# Patient Record
Sex: Male | Born: 1935
Health system: Southern US, Community
[De-identification: ages and names within clinical notes are randomized; demographics above are authoritative.]

## PROBLEM LIST (undated history)

## (undated) DIAGNOSIS — Z7901 Long term (current) use of anticoagulants: Secondary | ICD-10-CM

## (undated) DIAGNOSIS — I10 Essential (primary) hypertension: Secondary | ICD-10-CM

## (undated) DIAGNOSIS — R351 Nocturia: Secondary | ICD-10-CM

## (undated) DIAGNOSIS — Z9229 Personal history of other drug therapy: Secondary | ICD-10-CM

## (undated) DIAGNOSIS — N400 Enlarged prostate without lower urinary tract symptoms: Secondary | ICD-10-CM

## (undated) DIAGNOSIS — I4892 Unspecified atrial flutter: Secondary | ICD-10-CM

## (undated) DIAGNOSIS — R0602 Shortness of breath: Secondary | ICD-10-CM

## (undated) DIAGNOSIS — I429 Cardiomyopathy, unspecified: Secondary | ICD-10-CM

## (undated) HISTORY — DX: Cardiomyopathy, unspecified: I42.9

## (undated) HISTORY — PX: FEMORAL HERNIA REPAIR: SHX632

## (undated) HISTORY — DX: Personal history of other drug therapy: Z92.29

## (undated) HISTORY — DX: Essential (primary) hypertension: I10

## (undated) HISTORY — DX: Unspecified atrial flutter: I48.92

## (undated) HISTORY — PX: TURP VAPORIZATION: SUR1397

## (undated) HISTORY — PX: UMBILICAL HERNIA REPAIR: SHX196

## (undated) HISTORY — DX: Long term (current) use of anticoagulants: Z79.01

---

## 2002-03-23 ENCOUNTER — Ambulatory Visit (HOSPITAL_BASED_OUTPATIENT_CLINIC_OR_DEPARTMENT_OTHER): Admission: RE | Admit: 2002-03-23 | Discharge: 2002-03-23 | Payer: Self-pay | Admitting: Surgery

## 2002-06-25 HISTORY — PX: TOTAL KNEE ARTHROPLASTY: SHX125

## 2002-07-29 ENCOUNTER — Encounter: Payer: Self-pay | Admitting: Orthopedic Surgery

## 2002-08-03 ENCOUNTER — Inpatient Hospital Stay (HOSPITAL_COMMUNITY): Admission: RE | Admit: 2002-08-03 | Discharge: 2002-08-08 | Payer: Self-pay | Admitting: Orthopedic Surgery

## 2002-08-03 ENCOUNTER — Encounter: Payer: Self-pay | Admitting: Orthopedic Surgery

## 2004-02-14 ENCOUNTER — Encounter (INDEPENDENT_AMBULATORY_CARE_PROVIDER_SITE_OTHER): Payer: Self-pay | Admitting: *Deleted

## 2004-02-14 ENCOUNTER — Ambulatory Visit (HOSPITAL_BASED_OUTPATIENT_CLINIC_OR_DEPARTMENT_OTHER): Admission: RE | Admit: 2004-02-14 | Discharge: 2004-02-14 | Payer: Self-pay | Admitting: Urology

## 2004-02-14 ENCOUNTER — Ambulatory Visit (HOSPITAL_COMMUNITY): Admission: RE | Admit: 2004-02-14 | Discharge: 2004-02-14 | Payer: Self-pay | Admitting: Urology

## 2008-08-20 ENCOUNTER — Ambulatory Visit: Payer: Self-pay | Admitting: Cardiology

## 2008-08-25 ENCOUNTER — Ambulatory Visit: Payer: Self-pay | Admitting: Cardiology

## 2008-08-27 ENCOUNTER — Ambulatory Visit: Payer: Self-pay | Admitting: Cardiology

## 2008-08-31 ENCOUNTER — Ambulatory Visit: Payer: Self-pay | Admitting: Cardiology

## 2008-09-01 ENCOUNTER — Ambulatory Visit: Payer: Self-pay | Admitting: Internal Medicine

## 2008-09-02 ENCOUNTER — Encounter: Payer: Self-pay | Admitting: Internal Medicine

## 2008-09-07 ENCOUNTER — Ambulatory Visit: Payer: Self-pay | Admitting: Cardiology

## 2008-09-09 ENCOUNTER — Inpatient Hospital Stay (HOSPITAL_COMMUNITY): Admission: RE | Admit: 2008-09-09 | Discharge: 2008-09-10 | Payer: Self-pay | Admitting: Internal Medicine

## 2008-09-09 ENCOUNTER — Ambulatory Visit: Payer: Self-pay | Admitting: Internal Medicine

## 2008-09-09 ENCOUNTER — Encounter: Payer: Self-pay | Admitting: Cardiology

## 2008-09-17 ENCOUNTER — Ambulatory Visit: Payer: Self-pay | Admitting: Cardiology

## 2008-10-01 ENCOUNTER — Ambulatory Visit: Payer: Self-pay | Admitting: Cardiology

## 2008-10-12 ENCOUNTER — Encounter (INDEPENDENT_AMBULATORY_CARE_PROVIDER_SITE_OTHER): Payer: Self-pay | Admitting: *Deleted

## 2008-10-15 ENCOUNTER — Ambulatory Visit: Payer: Self-pay | Admitting: Internal Medicine

## 2008-10-15 ENCOUNTER — Ambulatory Visit: Payer: Self-pay | Admitting: Cardiology

## 2008-11-03 ENCOUNTER — Ambulatory Visit: Payer: Self-pay | Admitting: Cardiology

## 2008-11-04 ENCOUNTER — Ambulatory Visit: Payer: Self-pay | Admitting: Cardiology

## 2008-11-04 ENCOUNTER — Telehealth: Payer: Self-pay | Admitting: Internal Medicine

## 2008-11-05 ENCOUNTER — Ambulatory Visit: Payer: Self-pay | Admitting: Cardiology

## 2008-11-06 ENCOUNTER — Telehealth (INDEPENDENT_AMBULATORY_CARE_PROVIDER_SITE_OTHER): Payer: Self-pay | Admitting: Nurse Practitioner

## 2008-11-09 ENCOUNTER — Ambulatory Visit: Payer: Self-pay | Admitting: Cardiology

## 2008-11-16 ENCOUNTER — Encounter: Payer: Self-pay | Admitting: Cardiology

## 2008-11-18 ENCOUNTER — Ambulatory Visit: Payer: Self-pay | Admitting: Cardiology

## 2008-11-23 ENCOUNTER — Ambulatory Visit: Payer: Self-pay | Admitting: Cardiology

## 2008-11-29 ENCOUNTER — Encounter: Payer: Self-pay | Admitting: Cardiology

## 2008-12-01 ENCOUNTER — Telehealth: Payer: Self-pay | Admitting: Cardiology

## 2008-12-03 ENCOUNTER — Ambulatory Visit: Payer: Self-pay | Admitting: Cardiology

## 2008-12-10 ENCOUNTER — Ambulatory Visit: Payer: Self-pay

## 2008-12-17 ENCOUNTER — Ambulatory Visit: Payer: Self-pay

## 2008-12-20 ENCOUNTER — Ambulatory Visit: Payer: Self-pay | Admitting: Cardiology

## 2008-12-24 ENCOUNTER — Ambulatory Visit: Payer: Self-pay | Admitting: Cardiology

## 2008-12-28 ENCOUNTER — Ambulatory Visit: Payer: Self-pay | Admitting: Cardiology

## 2009-01-14 ENCOUNTER — Ambulatory Visit: Payer: Self-pay

## 2009-01-19 ENCOUNTER — Ambulatory Visit: Payer: Self-pay | Admitting: Cardiology

## 2009-01-28 ENCOUNTER — Ambulatory Visit: Payer: Self-pay | Admitting: Cardiology

## 2009-02-07 ENCOUNTER — Encounter: Payer: Self-pay | Admitting: *Deleted

## 2009-02-15 ENCOUNTER — Ambulatory Visit: Payer: Self-pay | Admitting: Cardiology

## 2009-02-15 LAB — CONVERTED CEMR LAB
POC INR: 2.1
Prothrombin Time: 17.9 s

## 2009-03-15 ENCOUNTER — Ambulatory Visit: Payer: Self-pay | Admitting: Cardiology

## 2009-03-15 LAB — CONVERTED CEMR LAB: POC INR: 2.4

## 2009-03-24 DIAGNOSIS — I2510 Atherosclerotic heart disease of native coronary artery without angina pectoris: Secondary | ICD-10-CM | POA: Insufficient documentation

## 2009-04-12 ENCOUNTER — Ambulatory Visit: Payer: Self-pay | Admitting: Cardiology

## 2009-04-12 LAB — CONVERTED CEMR LAB: POC INR: 2.6

## 2009-04-26 ENCOUNTER — Encounter: Payer: Self-pay | Admitting: Cardiology

## 2009-05-10 ENCOUNTER — Ambulatory Visit: Payer: Self-pay | Admitting: Cardiology

## 2009-05-10 LAB — CONVERTED CEMR LAB: POC INR: 1.9

## 2009-05-11 ENCOUNTER — Encounter: Payer: Self-pay | Admitting: Cardiology

## 2009-05-31 ENCOUNTER — Encounter: Payer: Self-pay | Admitting: Cardiology

## 2009-06-07 ENCOUNTER — Ambulatory Visit: Payer: Self-pay | Admitting: Cardiology

## 2009-06-07 LAB — CONVERTED CEMR LAB: POC INR: 2.5

## 2009-06-21 ENCOUNTER — Encounter: Payer: Self-pay | Admitting: Cardiology

## 2009-06-22 ENCOUNTER — Ambulatory Visit: Payer: Self-pay | Admitting: Cardiology

## 2009-06-22 DIAGNOSIS — R142 Eructation: Secondary | ICD-10-CM

## 2009-06-22 DIAGNOSIS — R141 Gas pain: Secondary | ICD-10-CM

## 2009-06-22 DIAGNOSIS — R143 Flatulence: Secondary | ICD-10-CM

## 2009-06-22 DIAGNOSIS — I4892 Unspecified atrial flutter: Secondary | ICD-10-CM | POA: Insufficient documentation

## 2009-06-23 ENCOUNTER — Encounter: Payer: Self-pay | Admitting: Cardiology

## 2009-07-12 ENCOUNTER — Ambulatory Visit: Payer: Self-pay | Admitting: Cardiology

## 2009-07-12 LAB — CONVERTED CEMR LAB: POC INR: 2.2

## 2009-07-19 ENCOUNTER — Encounter: Payer: Self-pay | Admitting: Cardiology

## 2009-07-26 ENCOUNTER — Telehealth (INDEPENDENT_AMBULATORY_CARE_PROVIDER_SITE_OTHER): Payer: Self-pay | Admitting: *Deleted

## 2009-08-09 ENCOUNTER — Ambulatory Visit: Payer: Self-pay | Admitting: Cardiology

## 2009-08-09 LAB — CONVERTED CEMR LAB: POC INR: 2.8

## 2009-08-10 ENCOUNTER — Encounter: Payer: Self-pay | Admitting: Cardiology

## 2009-09-06 ENCOUNTER — Ambulatory Visit: Payer: Self-pay | Admitting: Cardiology

## 2009-09-28 ENCOUNTER — Telehealth (INDEPENDENT_AMBULATORY_CARE_PROVIDER_SITE_OTHER): Payer: Self-pay | Admitting: *Deleted

## 2009-09-30 ENCOUNTER — Encounter: Payer: Self-pay | Admitting: Cardiology

## 2009-10-04 ENCOUNTER — Ambulatory Visit: Payer: Self-pay | Admitting: Cardiology

## 2009-10-04 LAB — CONVERTED CEMR LAB: POC INR: 2.7

## 2009-10-10 ENCOUNTER — Telehealth (INDEPENDENT_AMBULATORY_CARE_PROVIDER_SITE_OTHER): Payer: Self-pay | Admitting: *Deleted

## 2009-10-25 ENCOUNTER — Encounter: Payer: Self-pay | Admitting: Cardiology

## 2009-10-31 ENCOUNTER — Encounter: Payer: Self-pay | Admitting: Cardiology

## 2009-11-18 ENCOUNTER — Ambulatory Visit: Payer: Self-pay | Admitting: Cardiology

## 2009-11-18 LAB — CONVERTED CEMR LAB: POC INR: 2.6

## 2009-11-30 ENCOUNTER — Ambulatory Visit: Payer: Self-pay | Admitting: Cardiology

## 2009-11-30 DIAGNOSIS — R0602 Shortness of breath: Secondary | ICD-10-CM

## 2009-12-20 ENCOUNTER — Ambulatory Visit: Payer: Self-pay | Admitting: Cardiology

## 2009-12-20 LAB — CONVERTED CEMR LAB: POC INR: 2.6

## 2010-01-20 ENCOUNTER — Ambulatory Visit: Payer: Self-pay | Admitting: Cardiology

## 2010-02-02 ENCOUNTER — Encounter: Payer: Self-pay | Admitting: Cardiology

## 2010-02-06 ENCOUNTER — Encounter: Payer: Self-pay | Admitting: Cardiology

## 2010-02-15 ENCOUNTER — Encounter (INDEPENDENT_AMBULATORY_CARE_PROVIDER_SITE_OTHER): Payer: Self-pay | Admitting: *Deleted

## 2010-02-17 ENCOUNTER — Ambulatory Visit: Payer: Self-pay | Admitting: Cardiology

## 2010-02-17 LAB — CONVERTED CEMR LAB: POC INR: 2.4

## 2010-03-17 ENCOUNTER — Ambulatory Visit: Payer: Self-pay | Admitting: Cardiology

## 2010-04-14 ENCOUNTER — Ambulatory Visit: Payer: Self-pay | Admitting: Cardiology

## 2010-05-12 ENCOUNTER — Ambulatory Visit: Payer: Self-pay | Admitting: Cardiology

## 2010-06-16 ENCOUNTER — Encounter: Payer: Self-pay | Admitting: Physician Assistant

## 2010-06-16 ENCOUNTER — Ambulatory Visit: Payer: Self-pay | Admitting: Cardiology

## 2010-06-16 ENCOUNTER — Encounter: Payer: Self-pay | Admitting: Cardiology

## 2010-06-20 ENCOUNTER — Encounter: Payer: Self-pay | Admitting: Cardiology

## 2010-07-14 ENCOUNTER — Ambulatory Visit
Admission: RE | Admit: 2010-07-14 | Discharge: 2010-07-14 | Payer: Self-pay | Source: Home / Self Care | Attending: Cardiology | Admitting: Cardiology

## 2010-07-25 ENCOUNTER — Telehealth (INDEPENDENT_AMBULATORY_CARE_PROVIDER_SITE_OTHER): Payer: Self-pay | Admitting: *Deleted

## 2010-07-25 NOTE — Progress Notes (Signed)
Summary: Question concering CCR  Phone Note Call from Patient Call back at Home Phone [phone]   Caller: patient walked Reason for Call: Talk to Nurse Summary of Call: Patient would like to know if he can come off coumadin for 5 days for colonscopy schd for May 9th.  Did not know if doctor had contacted [place] in reference to this Initial call taken by: Claudette Laws,  October 10, 2009 11:03 AM  Follow-up for Phone Call        Patient notified.    Follow-up by: Hoover Brunette, LPN,  October 11, 2009 8:53 AM

## 2010-07-25 NOTE — Medication Information (Signed)
Summary: ccr-lr  Anticoagulant Therapy  Managed by: Vashti Hey, RN PCP: Lucianne Muss MD: Andee Lineman MD, Michelle Piper Indication 1: Atrial Flutter (ICD-427.32) [facility] [place] Site: [place] INR POC 2.5  Dietary changes: no    Health status changes: no    Bleeding/hemorrhagic complications: no    Recent/future hospitalizations: no    Any changes in medication regimen? no    Recent/future dental: no  Any missed doses?: yes     Details: Coumadin was on hold 3 days to have toenail removed 2 weeks ago  Is patient compliant with meds? yes       Allergies: 1)  ! Pcn 2)  ! Sulfa  Anticoagulation Management History:      The patient is taking warfarin and comes in today for a routine follow up visit.  Positive risk factors for bleeding include an age of 43 years or older.  The bleeding index is 'intermediate risk'.  Negative CHADS2 values include Age > 23 years old.  The start date was 08/20/2008.  Anticoagulation responsible provider: Andee Lineman MD, Michelle Piper.  INR POC: 2.5.  Cuvette Lot#: [id number].  Exp: 10/11.    Anticoagulation Management Assessment/Plan:      The patient's current anticoagulation dose is Warfarin sodium 5 mg tabs: Use as directed by Anticoagulation Clinic.  The target INR is 2 - 3.  The next INR is due 04/14/2010.  Anticoagulation instructions were given to patient.  Results were reviewed/authorized by Vashti Hey, RN.  He was notified by Vashti Hey RN.         Prior Anticoagulation Instructions: INR 2.4 Continue coumadin 5mg  once daily except 2.5mg  on Fridays  Current Anticoagulation Instructions: INR 2.5 Continue coumadin 5mg  once daily except 2.5mg  on Fridays

## 2010-07-25 NOTE — Medication Information (Signed)
Summary: ccr-lr  Anticoagulant Therapy  Managed by: Vashti Hey, RN PCP: Lucianne Muss MD: Andee Lineman MD, Michelle Piper Indication 1: Atrial Flutter (ICD-427.32) Lab [facility] [place] Site: [place] INR POC 2.2  Dietary changes: no    Health status changes: no    Bleeding/hemorrhagic complications: no    Recent/future hospitalizations: yes       Details: has thyroid Bx  Any changes in medication regimen? no    Recent/future dental: no  Any missed doses?: yes     Details: Was off coumadin 4 days for Bx 3 weeks ago  Is patient compliant with meds? yes       Allergies: 1)  ! Pcn 2)  ! Sulfa  Anticoagulation Management History:      The patient is taking warfarin and comes in today for a routine follow up visit.  Positive risk factors for bleeding include an age of 75 years or older.  The bleeding index is 'intermediate risk'.  Negative CHADS2 values include Age > 75 years old.  The start date was 08/20/2008.  Anticoagulation responsible provider: Andee Lineman MD, Michelle Piper.  INR POC: 2.2.  Cuvette Lot#: [id number].  Exp: 10/11.    Anticoagulation Management Assessment/Plan:      The patient's current anticoagulation dose is Warfarin sodium 5 mg tabs: Use as directed by Anticoagulation Clinic.  The target INR is 2 - 3.  The next INR is due 08/09/2009.  Anticoagulation instructions were given to patient.  Results were reviewed/authorized by Vashti Hey, RN.  He was notified by Vashti Hey RN.         Prior Anticoagulation Instructions: INR 2.5 Continue coumadin 5mg  once daily except 2.5mg  on Fridays  Current Anticoagulation Instructions: INR 2.2 Continue coumadin 5mg  once daily except 2.5mg  on Fridays

## 2010-07-25 NOTE — Letter (Signed)
Summary: Engineer, materials at [facility]  [address]   [place], [place] [address]   Phone: [phone]  Fax: [phone]        February 15, 2010 MRN: [id number]   [name] [address] [place], [place]  [address]   Dear [name],  Your test ordered by Selena Batten has been reviewed by your physician (or physician assistant) and was found to be normal or stable. Your physician (or physician assistant) felt no changes were needed at this time.  ____ Echocardiogram  ____ Cardiac Stress Test  __X__ Lab Work  ____ Peripheral vascular study of arms, legs or neck  ____ CT scan or X-ray  __X__ Lung or Breathing test  ____ Other:   Thank you.   Hoover Brunette, LPN    Duane Boston, M.D., F.A.C.C. Thressa Sheller, M.D., F.A.C.C. Oneal Grout, M.D., F.A.C.C. Cheree Ditto, M.D., F.A.C.C. Daiva Nakayama, M.D., F.A.C.C. Kenney Houseman, M.D., F.A.C.C. Jeanne Ivan, PA-C

## 2010-07-25 NOTE — Medication Information (Signed)
Summary: ccr-lr  Anticoagulant Therapy  Managed by: Vashti Hey, RN PCP: Lucianne Muss MD: Andee Lineman MD, Michelle Piper Indication 1: Atrial Flutter (ICD-427.32) Lab [facility] [place] Site: [place] INR POC 2.7  Dietary changes: no    Health status changes: no    Bleeding/hemorrhagic complications: no    Recent/future hospitalizations: no    Any changes in medication regimen? no    Recent/future dental: no  Any missed doses?: no       Is patient compliant with meds? yes       Allergies: 1)  ! Pcn 2)  ! Sulfa  Anticoagulation Management History:      The patient is taking warfarin and comes in today for a routine follow up visit.  Positive risk factors for bleeding include an age of 45 years or older.  The bleeding index is 'intermediate risk'.  Negative CHADS2 values include Age > 55 years old.  The start date was 08/20/2008.  Anticoagulation responsible provider: Andee Lineman MD, Michelle Piper.  INR POC: 2.7.  Cuvette Lot#: [id number].  Exp: 10/11.    Anticoagulation Management Assessment/Plan:      The patient's current anticoagulation dose is Warfarin sodium 5 mg tabs: Use as directed by Anticoagulation Clinic.  The target INR is 2 - 3.  The next INR is due 11/01/2009.  Anticoagulation instructions were given to patient.  Results were reviewed/authorized by Vashti Hey, RN.  He was notified by Vashti Hey RN.         Prior Anticoagulation Instructions: INR 2.9 Continue coumadin 5mg  once daily except 2.5mg  on Fridays  Current Anticoagulation Instructions: INR 2.7 Continue coumadin 5mg  once daily except 2.5mg  on Fridays

## 2010-07-25 NOTE — Medication Information (Signed)
Summary: ccr-lr  Anticoagulant Therapy  Managed by: Vashti Hey, RN PCP: Lucianne Muss MD: Andee Lineman MD, Michelle Piper Indication 1: Atrial Flutter (ICD-427.32) [facility]  Site: [place] INR POC 2.1  Dietary changes: no    Health status changes: no    Bleeding/hemorrhagic complications: no    Recent/future hospitalizations: no    Any changes in medication regimen? no    Recent/future dental: no  Any missed doses?: no       Is patient compliant with meds? yes       Allergies: 1)  ! Pcn 2)  ! Sulfa  Anticoagulation Management History:      The patient is taking warfarin and comes in today for a routine follow up visit.  Positive risk factors for bleeding include an age of 34 years or older.  The bleeding index is 'intermediate risk'.  Negative CHADS2 values include Age > 50 years old.  The start date was 08/20/2008.  Anticoagulation responsible Loree Shehata: Andee Lineman MD, Michelle Piper.  INR POC: 2.1.  Cuvette Lot#: [id number].  Exp: 10/11.    Anticoagulation Management Assessment/Plan:      The patient's current anticoagulation dose is Warfarin sodium 5 mg tabs: Use as directed by Anticoagulation Clinic.  The target INR is 2 - 3.  The next INR is due 06/16/2010.  Anticoagulation instructions were given to patient.  Results were reviewed/authorized by Vashti Hey, RN.  He was notified by Vashti Hey RN.         Prior Anticoagulation Instructions: INR 2.8 Continue coumadin 5mg  once daily except 2.5mg  on Fridays  Current Anticoagulation Instructions: INR 2.1 Continue coumadin 5mg  once daily except 2.5mg  on Fridays

## 2010-07-25 NOTE — Medication Information (Signed)
Summary: ccr-lr  Anticoagulant Therapy  Managed by: Vashti Hey, RN PCP: Lucianne Muss MD: Diona Browner MD, Remi Deter Indication 1: Atrial Flutter (ICD-427.32) Lab [facility] [place] Site: [place] INR POC 2.8  Dietary changes: no    Health status changes: no    Bleeding/hemorrhagic complications: no    Recent/future hospitalizations: no    Any changes in medication regimen? no    Recent/future dental: no  Any missed doses?: no       Is patient compliant with meds? yes       Allergies: 1)  ! Pcn 2)  ! Sulfa  Anticoagulation Management History:      The patient is taking warfarin and comes in today for a routine follow up visit.  Positive risk factors for bleeding include an age of 75 years or older.  The bleeding index is 'intermediate risk'.  Negative CHADS2 values include Age > 22 years old.  The start date was 08/20/2008.  Anticoagulation responsible provider: Diona Browner MD, Remi Deter.  INR POC: 2.8.  Cuvette Lot#: [id number].  Exp: 10/11.    Anticoagulation Management Assessment/Plan:      The patient's current anticoagulation dose is Warfarin sodium 5 mg tabs: Use as directed by Anticoagulation Clinic.  The target INR is 2 - 3.  The next INR is due 02/17/2010.  Anticoagulation instructions were given to patient.  Results were reviewed/authorized by Vashti Hey, RN.  He was notified by Vashti Hey RN.         Prior Anticoagulation Instructions: INR 2.6 Continue coumadin 5mg  once daily except 2.5mg  on Fridays  Current Anticoagulation Instructions: INR 2.8 Continue coumadin 5mg  once daily except 2.5mg  on Fridays

## 2010-07-25 NOTE — Medication Information (Signed)
Summary: ccr-lr  Anticoagulant Therapy  Managed by: Vashti Hey, RN PCP: Lucianne Muss MD: Andee Lineman MD, Michelle Piper Indication 1: Atrial Flutter (ICD-427.32) Lab [facility] [place] Site: [place] INR POC 2.6  Dietary changes: no    Health status changes: no    Bleeding/hemorrhagic complications: no    Recent/future hospitalizations: no    Any changes in medication regimen? no    Recent/future dental: no  Any missed doses?: no       Is patient compliant with meds? yes       Allergies: 1)  ! Pcn 2)  ! Sulfa  Anticoagulation Management History:      The patient is taking warfarin and comes in today for a routine follow up visit.  Positive risk factors for bleeding include an age of 75 years or older.  The bleeding index is 'intermediate risk'.  Negative CHADS2 values include Age > 32 years old.  The start date was 08/20/2008.  Anticoagulation responsible provider: Andee Lineman MD, Michelle Piper.  INR POC: 2.6.  Cuvette Lot#: [id number].  Exp: 10/11.    Anticoagulation Management Assessment/Plan:      The patient's current anticoagulation dose is Warfarin sodium 5 mg tabs: Use as directed by Anticoagulation Clinic.  The target INR is 2 - 3.  The next INR is due 12/20/2009.  Anticoagulation instructions were given to patient.  Results were reviewed/authorized by Vashti Hey, RN.  He was notified by Vashti Hey RN.         Prior Anticoagulation Instructions: INR 2.7 Continue coumadin 5mg  once daily except 2.5mg  on Fridays  Current Anticoagulation Instructions: INR 2.6 Continue coumadin 5mg  once daily except 2.5mg  on Fridays

## 2010-07-25 NOTE — Miscellaneous (Signed)
Summary: Orders Update - LFT,BNP,TSH  Clinical Lists Changes  Orders: Added new Test order of T-Hepatic Function [phone]) - Signed Added new Test order of T-BNP  (B Natriuretic Peptide) [phone]) - Signed Added new Test order of T-TSH ([id number]) - Signed

## 2010-07-25 NOTE — Letter (Signed)
Summary: External Correspondence/ PROGRESS NOTE [facility]   External Correspondence/ PROGRESS NOTE [facility]   Imported By: Dorise Hiss 07/21/2009 08:26:09  _____________________________________________________________________  External Attachment:    Type:   Image     Comment:   External Document

## 2010-07-25 NOTE — Medication Information (Signed)
Summary: ccr-lr  Anticoagulant Therapy  Managed by: Vashti Hey, RN PCP: Lucianne Muss MD: Antoine Poche MD, Fayrene Fearing Indication 1: Atrial Flutter (ICD-427.32) Lab [facility] [place] Site: [place] INR POC 2.8  Dietary changes: no    Health status changes: no    Bleeding/hemorrhagic complications: no    Recent/future hospitalizations: no    Any changes in medication regimen? yes       Details: started [employer] 08/04/09  Recent/future dental: no  Any missed doses?: no       Is patient compliant with meds? yes       Allergies: 1)  ! Pcn 2)  ! Sulfa  Anticoagulation Management History:      The patient is taking warfarin and comes in today for a routine follow up visit.  Positive risk factors for bleeding include an age of 38 years or older.  The bleeding index is 'intermediate risk'.  Negative CHADS2 values include Age > 40 years old.  The start date was 08/20/2008.  Anticoagulation responsible provider: Antoine Poche MD, Fayrene Fearing.  INR POC: 2.8.  Cuvette Lot#: [id number].  Exp: 10/11.    Anticoagulation Management Assessment/Plan:      The patient's current anticoagulation dose is Warfarin sodium 5 mg tabs: Use as directed by Anticoagulation Clinic.  The target INR is 2 - 3.  The next INR is due 09/06/2009.  Anticoagulation instructions were given to patient.  Results were reviewed/authorized by Vashti Hey, RN.  He was notified by Vashti Hey RN.         Prior Anticoagulation Instructions: INR 2.2 Continue coumadin 5mg  once daily except 2.5mg  on Fridays  Current Anticoagulation Instructions: INR 2.8 Continue coumadin 5mg  once daily except 2.5mg  on Fridays

## 2010-07-25 NOTE — Progress Notes (Signed)
Summary: okay to stop coumadin  Phone Note Other Incoming   Summary of Call: 4/6 -12:35:  Ann w/ Dr. Karilyn Cota - pt. scheduled for colonoscopy on May 9 and needs to know if okay to stop coumadin x 5 days prior to procedure. [phone]  ext  2529 Initial call taken by: Hoover Brunette, LPN,  September 28, 2009 3:55 PM  Follow-up for Phone Call        OK to  stop coumadin 5 days before procedure.  Follow-up by: Lewayne Bunting, MD, [facility],  October 05, 2009 1:24 PM  Additional Follow-up for Phone Call Additional follow up Details #1::        Ann with Dr. Patty Sermons office notified.   Additional Follow-up by: Hoover Brunette, LPN,  October 11, 2009 8:51 AM

## 2010-07-25 NOTE — Medication Information (Signed)
Summary: ccr-lr  Anticoagulant Therapy  Managed by: Vashti Hey, RN PCP: Lucianne Muss MD: Myrtis Ser MD, Tinnie Gens Indication 1: Atrial Flutter (ICD-427.32) Lab [facility] [place] Site: [place] INR POC 2.4  Dietary changes: no    Health status changes: no    Bleeding/hemorrhagic complications: no    Recent/future hospitalizations: no    Any changes in medication regimen? no    Recent/future dental: no  Any missed doses?: no       Is patient compliant with meds? yes       Allergies: 1)  ! Pcn 2)  ! Sulfa  Anticoagulation Management History:      The patient is taking warfarin and comes in today for a routine follow up visit.  Positive risk factors for bleeding include an age of 15 years or older.  The bleeding index is 'intermediate risk'.  Negative CHADS2 values include Age > 61 years old.  The start date was 08/20/2008.  Anticoagulation responsible provider: Myrtis Ser MD, Tinnie Gens.  INR POC: 2.4.  Cuvette Lot#: [id number].  Exp: 10/11.    Anticoagulation Management Assessment/Plan:      The patient's current anticoagulation dose is Warfarin sodium 5 mg tabs: Use as directed by Anticoagulation Clinic.  The target INR is 2 - 3.  The next INR is due 03/17/2010.  Anticoagulation instructions were given to patient.  Results were reviewed/authorized by Vashti Hey, RN.  He was notified by Vashti Hey RN.         Prior Anticoagulation Instructions: INR 2.8 Continue coumadin 5mg  once daily except 2.5mg  on Fridays  Current Anticoagulation Instructions: INR 2.4 Continue coumadin 5mg  once daily except 2.5mg  on Fridays

## 2010-07-25 NOTE — Progress Notes (Signed)
Summary: bp concern  Phone Note Call from Patient   Summary of Call: Pt. and wife came by office end of last week.  Wife stated that Derral had just been to Dr. Elmer Picker and wanted [place] to follow up on his blood pressure since his pressures had gone up in his eyes.  Has had to go on eye drops.  Discussed this with wife and told her that his last blood pressure reading was 99/62 with heart rate of 50.   He also is not due to come back for 6 months.  But, did tell her that I would share info with MD.  Also, c/o numbness in feet while driving.  After further questioning, this only happened x 1 while he was driving to [place].  Told her that it was probably due to circulation and holding legs in same position for an extended period of time.   Initial call taken by: Hoover Brunette, LPN,  July 26, 2009 11:36 AM  Follow-up for Phone Call        Needs to keep log BP. For now no additional treatment. Can see patient earlier if needed. Lewayne Bunting, MD, [facility]  July 27, 2009 5:10 AM  Patient notified.     Follow-up by: Hoover Brunette, LPN,  July 28, 2009 3:36 PM

## 2010-07-25 NOTE — Letter (Signed)
Summary: External Correspondence/ BLOOD PRESSURE READINGS  External Correspondence/ BLOOD PRESSURE READINGS   Imported By: Dorise Hiss 08/10/2009 11:12:29  _____________________________________________________________________  External Attachment:    Type:   Image     Comment:   External Document  Appended Document: External Correspondence/ BLOOD PRESSURE READINGS Blood pressure readings WNL. Continue current med. regimen.   Appended Document: External Correspondence/ BLOOD PRESSURE READINGS Patient notified.

## 2010-07-25 NOTE — Medication Information (Signed)
Summary: ccr-lr  Anticoagulant Therapy  Managed by: Vashti Hey, RN PCP: Lucianne Muss MD: Andee Lineman MD, Michelle Piper Indication 1: Atrial Flutter (ICD-427.32) Lab [facility] [place] Site: [place] INR POC 2.8  Dietary changes: no    Health status changes: no    Bleeding/hemorrhagic complications: no    Recent/future hospitalizations: no    Any changes in medication regimen? no    Recent/future dental: no  Any missed doses?: no       Is patient compliant with meds? yes       Allergies: 1)  ! Pcn 2)  ! Sulfa  Anticoagulation Management History:      His anticoagulation is being managed by telephone today.  Positive risk factors for bleeding include an age of 75 years or older.  The bleeding index is 'intermediate risk'.  Negative CHADS2 values include Age > 81 years old.  The start date was 08/20/2008.  Anticoagulation responsible Langdon Crosson: Andee Lineman MD, Michelle Piper.  INR POC: 2.8.  Cuvette Lot#: [id number].  Exp: 10/11.    Anticoagulation Management Assessment/Plan:      The patient's current anticoagulation dose is Warfarin sodium 5 mg tabs: Use as directed by Anticoagulation Clinic.  The target INR is 2 - 3.  The next INR is due 05/12/2010.  Anticoagulation instructions were given to patient.  Results were reviewed/authorized by Vashti Hey, RN.  He was notified by Vashti Hey RN.         Prior Anticoagulation Instructions: INR 2.5 Continue coumadin 5mg  once daily except 2.5mg  on Fridays  Current Anticoagulation Instructions: INR 2.8 Continue coumadin 5mg  once daily except 2.5mg  on Fridays

## 2010-07-25 NOTE — Medication Information (Signed)
Summary: ccr-lr  Anticoagulant Therapy  Managed by: Vashti Hey, RN PCP: Lucianne Muss MD: Andee Lineman MD, Michelle Piper Indication 1: Atrial Flutter (ICD-427.32) Lab [facility] [place] Site: [place] INR POC 2.6  Dietary changes: no    Health status changes: no    Bleeding/hemorrhagic complications: no    Recent/future hospitalizations: no    Any changes in medication regimen? no    Recent/future dental: no  Any missed doses?: no       Is patient compliant with meds? yes       Allergies: 1)  ! Pcn 2)  ! Sulfa  Anticoagulation Management History:      The patient is taking warfarin and comes in today for a routine follow up visit.  Positive risk factors for bleeding include an age of 75 years or older.  The bleeding index is 'intermediate risk'.  Negative CHADS2 values include Age > 32 years old.  The start date was 08/20/2008.  Anticoagulation responsible provider: Andee Lineman MD, Michelle Piper.  INR POC: 2.6.  Cuvette Lot#: [id number].  Exp: 10/11.    Anticoagulation Management Assessment/Plan:      The patient's current anticoagulation dose is Warfarin sodium 5 mg tabs: Use as directed by Anticoagulation Clinic.  The target INR is 2 - 3.  The next INR is due 01/20/2010.  Anticoagulation instructions were given to patient.  Results were reviewed/authorized by Vashti Hey, RN.  He was notified by Vashti Hey RN.         Prior Anticoagulation Instructions: INR 2.6 Continue coumadin 5mg  once daily except 2.5mg  on Fridays  Current Anticoagulation Instructions: Same as Prior Instructions.

## 2010-07-25 NOTE — Medication Information (Signed)
Summary: ccr-lr  Anticoagulant Therapy  Managed by: Vashti Hey, RN PCP: Lucianne Muss MD: Andee Lineman MD, Michelle Piper Indication 1: Atrial Flutter (ICD-427.32) Lab [facility] [place] Site: [place] INR POC 2.9  Dietary changes: no    Health status changes: no    Bleeding/hemorrhagic complications: no    Recent/future hospitalizations: no    Any changes in medication regimen? no    Recent/future dental: no  Any missed doses?: no       Is patient compliant with meds? yes       Allergies: 1)  ! Pcn 2)  ! Sulfa  Anticoagulation Management History:      The patient is taking warfarin and comes in today for a routine follow up visit.  Positive risk factors for bleeding include an age of 44 years or older.  The bleeding index is 'intermediate risk'.  Negative CHADS2 values include Age > 6 years old.  The start date was 08/20/2008.  Anticoagulation responsible provider: Andee Lineman MD, Michelle Piper.  INR POC: 2.9.  Cuvette Lot#: [id number].  Exp: 10/11.    Anticoagulation Management Assessment/Plan:      The patient's current anticoagulation dose is Warfarin sodium 5 mg tabs: Use as directed by Anticoagulation Clinic.  The target INR is 2 - 3.  The next INR is due 10/04/2009.  Anticoagulation instructions were given to patient.  Results were reviewed/authorized by Vashti Hey, RN.  He was notified by Vashti Hey RN.         Prior Anticoagulation Instructions: INR 2.8 Continue coumadin 5mg  once daily except 2.5mg  on Fridays  Current Anticoagulation Instructions: INR 2.9 Continue coumadin 5mg  once daily except 2.5mg  on Fridays

## 2010-07-25 NOTE — Assessment & Plan Note (Signed)
Summary: 6 MO FU PER JUNE REMINDER-SRS   Visit Type:  Follow-up Primary Provider:  Sherril Croon   History of Present Illness: the patient is a 77 year old male with history of atrial flutter and tachycardia-induced cardiomyopathy.  His ejection fraction has normalized to 65%.  Status post radio catheter frequency ablation for atrial flutter.  However he had recurrent atrial flutter after the procedure and was placed on amiodarone.  He remains very normal sinus rhythm, albeit sinus bradycardia.  He also remains on Coumadin.  He reports no dizziness or weakness associated with bradycardia.  Had a recent echocardiographic study done in his primary care physician's office.  There was evidence of left atrial enlargement and a small pericardial effusion and very borderline pulmonary hypertension.  I explained to the patient that these findings were essentially nonsignificant.  From a clinical standpoint the patient stated that these slightly more short of breath over the last several months.  Interestingly however he has no shortness of breath during the daytime.  He reports when laying flat he has an increase in shortness of breath.  He denies any central chest pain.  Preventive Screening-Counseling & Management  Alcohol-Tobacco     Smoking Status: never  Current Medications (verified): 1)  Saw Palmetto   [employer]) .... Two Times A Day 2)  Fish Oil   Oil (Fish Oil) .... 1000mg  Once Daily 3)  Flax   Oil (Flaxseed (Linseed)) .... 1000mg  Once Daily 4)  Multivitamins   Tabs (Multiple Vitamin) .... Once Daily 5)  Xalatan 0.005 % Soln (Latanoprost) .... At Bedtime 6)  Warfarin Sodium 5 Mg Tabs (Warfarin Sodium) .... Use As Directed By Anticoagulation Clinic 7)  Metoprolol Tartrate 25 Mg Tabs (Metoprolol Tartrate) .... Take 1/2  Tablet By Mouth Once Daily 8)  Amiodarone Hcl 200 Mg Tabs (Amiodarone Hcl) .... Take 1/2 Tab (100mg ) Daily 9)  Flomax 0.4 Mg Cp24 (Tamsulosin Hcl) .... Take 1 Capsule By  Mouth Nightly 10)  Furosemide 20 Mg Tabs (Furosemide) .... Take One Tablet By Mouth Daily. 11)  Oscal 500/200 D-3 500-200 Mg-Unit Tabs (Calcium-Vitamin D) .... Take 1 Tablet By Mouth Once A Day 12)  Icaps Lutein-Zeaxanthin  Cr-Tabs (Specialty Vitamins Products) .... Take 1 Tablet By Mouth Once A Day 13)  Avodart 0.5 Mg Caps (Dutasteride) .... Take 1 Capsule By Mouth Once A Day  Allergies: 1)  ! Pcn 2)  ! Sulfa  Comments:  Nurse/Medical Assistant: The patient's medications were reviewed with the patient and were updated in the Medication List. Pt brought medication bottles to office visit.  Cyril Loosen, RN, BSN (November 30, 2009 1:10 PM)  Past History:  Past Medical History: Last updated: 06/22/2009 LEFT VENTRICULAR FUNCTION, DECREASED (ICD-429.2) atypical flutter back in normal sinus rhythm 1. Status post radio catheter frequency ablation for typical flutter. 2. Recurrent atypical flutter back in normal sinus rhythm, on     amiodarone. 3. Status post TEE with left atrial appendage clots, on Coumadin. 4. Left ventricular dysfunction, improved to 60-65%.  Past Surgical History: Last updated: 03/24/2009 herniorrhaphy x 2 TURP Knee Arthroplasty-Total  Family History: Last updated: 06/22/2009 noncontributory  Social History: Last updated: 03/24/2009 Retired  Married  Tobacco Use - No.  Alcohol Use - no Drug Use - no  Risk Factors: Smoking Status: never (11/30/2009)  Review of Systems       The patient complains of shortness of breath.  The patient denies fatigue, malaise, fever, weight gain/loss, vision loss, decreased hearing, hoarseness, chest pain, palpitations, prolonged  cough, wheezing, sleep apnea, coughing up blood, abdominal pain, blood in stool, nausea, vomiting, diarrhea, heartburn, incontinence, blood in urine, muscle weakness, joint pain, leg swelling, rash, skin lesions, headache, fainting, dizziness, depression, anxiety, enlarged lymph nodes, easy bruising  or bleeding, and environmental allergies.    Vital Signs:  Patient profile:   77 year old male Height:      71 inches Weight:      209.50 pounds Pulse rate:   45 / minute BP sitting:   134 / 79  (left arm) Cuff size:   large  Vitals Entered By: Cyril Loosen, RN, BSN (November 30, 2009 1:06 PM) Comments No cardiac complaints   Physical Exam  Additional Exam:  General: Well-developed, well-nourished in no distress head: Normocephalic and atraumatic eyes PERRLA/EOMI intact, conjunctiva and lids normal nose: No deformity or lesions mouth normal dentition, normal posterior pharynx neck: Supple, no JVD.  No masses, thyromegaly or abnormal cervical nodes lungs: Normal breath sounds bilaterally without wheezing.  Normal percussion heart: regular rate and rhythm with normal S1 and S2, no S3 or S4.  PMI is normal.  No pathological murmurs abdomen: Normal bowel sounds, abdomen is soft and nontender without masses, organomegaly or hernias noted.  No hepatosplenomegaly musculoskeletal: Back normal, normal gait muscle strength and tone normal pulsus: Pulse is normal in all 4 extremities Extremities: No peripheral pitting edema neurologic: Alert and oriented x 3 skin: Intact without lesions or rashes cervical nodes: No significant adenopathy psychologic: Normal affect    EKG  Procedure date:  11/30/2009  Findings:      marked sinus bradycardia heart rate 42 views/min nonspecific ST-T wave changes.  Impression & Recommendations:  Problem # 1:  DYSPNEA (ICD-786.05) the patient mainly complains of dyspnea during the nighttime.  He reports dyspnea when laying flat.  He does not have any frank PND or orthopnea however.  Suspect his dyspnea may be related to gastropharyngeal reflux disease.  The patient also will need monitoring of pulmonary function studies and DLCO in the setting of amiodarone use.  If no definite explanation bipolar function studies for his dyspnea and the patient should be  started on a trial off antireflux medication. His updated medication list for this problem includes:    Metoprolol Tartrate 25 Mg Tabs (Metoprolol tartrate) .[name]... Take 1/2  tablet by mouth once daily    Furosemide 20 Mg Tabs (Furosemide) .[name]... Take one tablet by mouth daily.  Problem # 2:  ATRIAL FLUTTER, PAROXYSMAL (ICD-427.32) the patient remains in normal sinus rhythm, albeit sinus bradycardia.  I clarified and that is amiodarone is only 100 mg a day.  The patient was taken200 mg a day.the patient also needs liver function studies, TSH and BNP level. His updated medication list for this problem includes:    Warfarin Sodium 5 Mg Tabs (Warfarin sodium) ..... Use as directed by anticoagulation clinic    Metoprolol Tartrate 25 Mg Tabs (Metoprolol tartrate) .[name]... Take 1/2  tablet by mouth once daily    Amiodarone Hcl 200 Mg Tabs (Amiodarone hcl) .[name]... Take 1/2 tab (100mg ) daily  Orders: EKG w/ Interpretation (93000)  Problem # 3:  LEFT VENTRICULAR FUNCTION, DECREASED (ICD-429.2) normalized LV function  Patient Instructions: 1)  Labs & Pulmonary Funciton test in one month. 2)  Decrease Amiodarone to 100mg  daily 3)  Follow up in  6 months.

## 2010-07-25 NOTE — Miscellaneous (Signed)
Summary: Orders Update - PFT + DLCO  Clinical Lists Changes  Orders: Added new Referral order of Pulmonary Function Test (PFT) - Signed

## 2010-07-27 NOTE — Assessment & Plan Note (Signed)
Summary: 6 MO FUL/ needs INR checked/lr   Visit Type:  Follow-up Primary Provider:  Sherril Croon   History of Present Illness: patient presents for scheduled followup.  He denies any interim development of tachycardia palpitations. He denies syncope/syncope, dizziness, gait instability, or falls.  Following last visit, surveillance labs notable for normal TSH, hepatic profile. PFT study was normal, as well.  Patient is on chronic Coumadin, followed in our office.  Preventive Screening-Counseling & Management  Alcohol-Tobacco     Smoking Status: never  Current Medications (verified): 1)  Saw Palmetto   [employer]) .... Two Times A Day 2)  Fish Oil   Oil (Fish Oil) .... 1000mg  Once Daily 3)  Flax   Oil (Flaxseed (Linseed)) .... 1000mg  Once Daily 4)  Multivitamins   Tabs (Multiple Vitamin) .... Once Daily 5)  Xalatan 0.005 % Soln (Latanoprost) .... At Bedtime 6)  Warfarin Sodium 5 Mg Tabs (Warfarin Sodium) .... Use As Directed By Anticoagulation Clinic 7)  Amiodarone Hcl 200 Mg Tabs (Amiodarone Hcl) .... Take 1/2 Tab (100mg ) Daily 8)  Flomax 0.4 Mg Cp24 (Tamsulosin Hcl) .... Take 1 Capsule By Mouth Nightly 9)  Oscal 500/200 D-3 500-200 Mg-Unit Tabs (Calcium-Vitamin D) .... Take 1 Tablet By Mouth Once A Day 10)  Icaps Lutein-Zeaxanthin  Cr-Tabs (Specialty Vitamins Products) .... Take 1 Tablet By Mouth Once A Day  Allergies: 1)  ! Pcn 2)  ! Sulfa  Comments:  Nurse/Medical Assistant: The patient's medications and allergies were reviewed with the patient and were updated in the Medication and Allergy Lists. Pt brought medication bottles to office visit.  Cyril Loosen, RN, BSN (June 16, 2010 2:32 PM)  Past History:  Past Medical History: Last updated: 06/22/2009 LEFT VENTRICULAR FUNCTION, DECREASED (ICD-429.2) atypical flutter back in normal sinus rhythm 1. Status post radio catheter frequency ablation for typical flutter. 2. Recurrent atypical flutter back in normal  sinus rhythm, on     amiodarone. 3. Status post TEE with left atrial appendage clots, on Coumadin. 4. Left ventricular dysfunction, improved to 60-65%.  Review of Systems       No fevers, chills, hemoptysis, dysphagia, melena, hematocheezia, hematuria, rash, claudication, orthopnea, pnd, pedal edema. All other systems negative.   Vital Signs:  Patient profile:   77 year old male Height:      71 inches Weight:      211.50 pounds BMI:     29.60 Pulse rate:   47 / minute BP sitting:   116 / 72  (left arm) Cuff size:   large  Vitals Entered By: Cyril Loosen, RN, BSN (June 16, 2010 2:27 PM)  Nutrition Counseling: Patient's BMI is greater than 25 and therefore counseled on weight management options. Comments Some trouble with breathing at night otherwise, no cardiac complaints   Physical Exam  Additional Exam:  GEN: 77 year old male, no distress HEENT: NCAT,PERRLA,EOMI NECK: palpable pulses, no bruits; no JVD; no TM LUNGS: CTA bilaterally HEART: RRR (S1S2); no significant murmurs; no rubs; no gallops ABD: soft, NT; intact BS EXT: intact distal pulses; 1+ RLE edema SKIN: warm, dry MUSC: no obvious deformity NEURO: A/O (x3)     EKG  Procedure date:  06/16/2010  Findings:      marked sinus bradycardia at 46 bpm with first degree AV block; question old IMI; nonspecific ST changes  Impression & Recommendations:  Problem # 1:  ATRIAL FLUTTER, PAROXYSMAL (ICD-427.32)  maintaining sinus bradycardia, on low dose amiodarone, status post RF ablation. This was decreased at time of  last OV, at which time he also presented with marked sinus bradycardia, at 42 bpm. Given the persistent bradycardia, therefore, will discontinue low-dose metoprolol, altogether.  Problem # 2:  LEFT VENTRICULAR FUNCTION, DECREASED (ICD-429.2)  most likely secondary to tachycardia-induced etiology, subsequently normalized to 65%. In the absence of signs or symptoms suggestive of CHF, will  discontinue low-dose furosemide. Patient instructed to contact our office, if he were to develop orthopnea, PND, DOE, or peripheral edema.  Problem # 3:  COUMADIN THERAPY (ICD-V58.61)  followed in our clinic. History of LAA clots.  Other Orders: EKG w/ Interpretation (93000)  Patient Instructions: 1)  Stop Metoprolol 2)  Stop Lasix 3)  Follow up in  6 months

## 2010-07-27 NOTE — Medication Information (Signed)
Summary: ccrv --agh  Anticoagulant Therapy  Managed by: Larry Hey, RN PCP: Larry Muss MD: Larry Lineman MD, [name] Indication 1: Atrial Flutter (ICD-427.32) [facility] [place] Site: [place] INR POC 2.2  Dietary changes: no    Health status changes: no    Bleeding/hemorrhagic complications: no    Recent/future hospitalizations: no    Any changes in medication regimen? no    Recent/future dental: no  Any missed doses?: no       Is patient compliant with meds? yes       Allergies: 1)  ! Pcn 2)  ! Sulfa  Anticoagulation Management History:      The patient is taking warfarin and comes in today for a routine follow up visit.  Positive risk factors for bleeding include an age of 75 years or older.  The bleeding index is 'intermediate risk'.  Negative CHADS2 values include Age > 68 years old.  The start date was 08/20/2008.  Anticoagulation responsible provider: Andee Lineman MD, [name].  INR POC: 2.2.  Cuvette Lot#: [id number].  Exp: 10/11.    Anticoagulation Management Assessment/Plan:      The patient's current anticoagulation dose is Warfarin sodium 5 mg tabs: Use as directed by Anticoagulation Clinic.  The target INR is 2 - 3.  The next INR is due 08/11/2010.  Anticoagulation instructions were given to patient.  Results were reviewed/authorized by Larry Hey, RN.  He was notified by Larry Hey RN.         Prior Anticoagulation Instructions: INR 2.4 Continue coumadin 5mg  once daily except 2.5mg  on Fridays  Current Anticoagulation Instructions: INR 2.2 Continue coumadin 5mg  once daily except 2.5mg  on Fridays

## 2010-07-27 NOTE — Assessment & Plan Note (Signed)
Summary: coumadin management   Anticoagulant Therapy  Managed by: Cyril Loosen RN PCP: Lucianne Muss MD: Andee Lineman MD, Michelle Piper Indication 1: Atrial Flutter (ICD-427.32) Lab [facility] [place] Site: [place] INR POC 2.4  Dietary changes: no    Health status changes: no    Bleeding/hemorrhagic complications: no    Recent/future hospitalizations: no    Any changes in medication regimen? no    Recent/future dental: no  Any missed doses?: no       Is patient compliant with meds? yes         Anticoagulation Management History:      The patient is taking warfarin and comes in today for a routine follow up visit.  Positive risk factors for bleeding include an age of 75 years or older.  The bleeding index is 'intermediate risk'.  Negative CHADS2 values include Age > 69 years old.  The start date was 08/20/2008.  Anticoagulation responsible provider: Andee Lineman MD, Michelle Piper.  INR POC: 2.4.  Cuvette Lot#: [id number].  Exp: 10/11.    Anticoagulation Management Assessment/Plan:      The patient's current anticoagulation dose is Warfarin sodium 5 mg tabs: Use as directed by Anticoagulation Clinic.  The target INR is 2 - 3.  The next INR is due 07/14/2010.  Anticoagulation instructions were given to patient.  Results were reviewed/authorized by Cyril Loosen RN.  He was notified by Cyril Loosen RN.         Prior Anticoagulation Instructions: INR 2.1 Continue coumadin 5mg  once daily except 2.5mg  on Fridays  Current Anticoagulation Instructions: INR 2.4 Continue coumadin 5mg  once daily except 2.5mg  on Fridays

## 2010-08-02 NOTE — Progress Notes (Signed)
Summary: PHONE: ON COUMDIN AND NEEDS SURG. PROCEDURE  Phone Note From Other Clinic   Caller: PAM @ [facility]. [facility] OFFICE Request: Talk with Provider Details for Reason: ON COUMDIN AND NEEDS SURGICAL PROCEDURE. Summary of Call: [name] is having a procedure on 08-14-2010 for BPH. Wants to know if they can hold Coumdin before procedure. [phone] ext. [phone]   fax # [phone]  Initial call taken by: Zachary George,  July 25, 2010 1:52 PM  Follow-up for Phone Call        yes Coumadin can be held safely for 5 days before surgery and can be restarted after surgery per discretion of the urologist Follow-up by: Lewayne Bunting, MD, [facility],  July 27, 2010 7:43 AM  Additional Follow-up for Phone Call Additional follow up Details #1::        Will fax note to above. Additional Follow-up by: Hoover Brunette, LPN,  July 27, 2010 1:47 PM

## 2010-08-22 ENCOUNTER — Encounter (INDEPENDENT_AMBULATORY_CARE_PROVIDER_SITE_OTHER): Payer: Medicare Other

## 2010-08-22 ENCOUNTER — Encounter: Payer: Self-pay | Admitting: Cardiology

## 2010-08-22 DIAGNOSIS — I4892 Unspecified atrial flutter: Secondary | ICD-10-CM

## 2010-08-22 DIAGNOSIS — Z7901 Long term (current) use of anticoagulants: Secondary | ICD-10-CM

## 2010-08-31 NOTE — Medication Information (Signed)
Summary: ccr-lr  Anticoagulant Therapy  Managed by: Vashti Hey, RN PCP: Lucianne Muss MD: Diona Browner MD, Remi Deter Indication 1: Atrial Flutter (ICD-427.32) Lab [facility] [place] Site: [place] INR POC 1.5  Dietary changes: no    Health status changes: no    Bleeding/hemorrhagic complications: no    Recent/future hospitalizations: yes       Details: Had procedure done on prostate  Any changes in medication regimen? no    Recent/future dental: no  Any missed doses?: yes     Details: Was off coumadin 6 days   Restarted coumadin on 08/15/10  Is patient compliant with meds? yes       Allergies: 1)  ! Pcn 2)  ! Sulfa  Anticoagulation Management History:      The patient is taking warfarin and comes in today for a routine follow up visit.  Positive risk factors for bleeding include an age of 75 years or older.  The bleeding index is 'intermediate risk'.  Negative CHADS2 values include Age > 13 years old.  The start date was 08/20/2008.  Anticoagulation responsible provider: Diona Browner MD, Remi Deter.  INR POC: 1.5.  Cuvette Lot#: [id number].  Exp: 10/11.    Anticoagulation Management Assessment/Plan:      The patient's current anticoagulation dose is Warfarin sodium 5 mg tabs: Use as directed by Anticoagulation Clinic.  The target INR is 2 - 3.  The next INR is due 09/05/2010.  Anticoagulation instructions were given to patient.  Results were reviewed/authorized by Vashti Hey, RN.  He was notified by Vashti Hey RN.         Prior Anticoagulation Instructions: INR 2.2 Continue coumadin 5mg  once daily except 2.5mg  on Fridays  Current Anticoagulation Instructions: INR 1.5 Had been off coumadin 6 days for prostate procedure.  Been back on it 1 wk. Take coumadin 1 1/2 tablets tonight and tomorrow night then resume 1 tablet once daily except 1/2 tablet on Fridays

## 2010-09-05 ENCOUNTER — Encounter (INDEPENDENT_AMBULATORY_CARE_PROVIDER_SITE_OTHER): Payer: Medicare Other

## 2010-09-05 ENCOUNTER — Encounter: Payer: Self-pay | Admitting: Cardiology

## 2010-09-05 DIAGNOSIS — Z7901 Long term (current) use of anticoagulants: Secondary | ICD-10-CM

## 2010-09-05 DIAGNOSIS — I4892 Unspecified atrial flutter: Secondary | ICD-10-CM

## 2010-09-12 NOTE — Medication Information (Signed)
Summary: ccr-lr  Anticoagulant Therapy  Managed by: Vashti Hey, RN PCP: Lucianne Muss MD: Antoine Poche MD, Fayrene Fearing Indication 1: Atrial Flutter (ICD-427.32) Lab [facility] [place] Site: [place] INR POC 2.2  Dietary changes: no    Health status changes: no    Bleeding/hemorrhagic complications: no    Recent/future hospitalizations: no    Any changes in medication regimen? no    Recent/future dental: no  Any missed doses?: no       Is patient compliant with meds? yes       Allergies: 1)  ! Pcn 2)  ! Sulfa  Anticoagulation Management History:      The patient is taking warfarin and comes in today for a routine follow up visit.  Positive risk factors for bleeding include an age of 75 years or older.  The bleeding index is 'intermediate risk'.  Negative CHADS2 values include Age > 8 years old.  The start date was 08/20/2008.  Anticoagulation responsible provider: Antoine Poche MD, Fayrene Fearing.  INR POC: 2.2.  Cuvette Lot#: [id number].  Exp: 10/11.    Anticoagulation Management Assessment/Plan:      The patient's current anticoagulation dose is Warfarin sodium 5 mg tabs: Use as directed by Anticoagulation Clinic.  The target INR is 2 - 3.  The next INR is due 10/03/2010.  Anticoagulation instructions were given to patient.  Results were reviewed/authorized by Vashti Hey, RN.  He was notified by Vashti Hey RN.         Prior Anticoagulation Instructions: INR 1.5 Had been off coumadin 6 days for prostate procedure.  Been back on it 1 wk. Take coumadin 1 1/2 tablets tonight and tomorrow night then resume 1 tablet once daily except 1/2 tablet on Fridays  Current Anticoagulation Instructions: INR 2.2 Continue coumadin 5mg  once daily except 2.5mg  on Fridays

## 2010-10-02 ENCOUNTER — Encounter: Payer: Self-pay | Admitting: Cardiology

## 2010-10-02 DIAGNOSIS — I4892 Unspecified atrial flutter: Secondary | ICD-10-CM

## 2010-10-02 DIAGNOSIS — Z7901 Long term (current) use of anticoagulants: Secondary | ICD-10-CM | POA: Insufficient documentation

## 2010-10-03 ENCOUNTER — Ambulatory Visit (INDEPENDENT_AMBULATORY_CARE_PROVIDER_SITE_OTHER): Payer: Medicare Other | Admitting: *Deleted

## 2010-10-03 DIAGNOSIS — I4892 Unspecified atrial flutter: Secondary | ICD-10-CM

## 2010-10-03 DIAGNOSIS — Z7901 Long term (current) use of anticoagulants: Secondary | ICD-10-CM

## 2010-10-05 LAB — PROTIME-INR: Prothrombin Time: 18.9 seconds — ABNORMAL HIGH (ref 11.6–15.2)

## 2010-10-05 LAB — CBC
HCT: 45.9 % (ref 39.0–52.0)
MCHC: 34.4 g/dL (ref 30.0–36.0)
MCV: 93.6 fL (ref 78.0–100.0)
Platelets: 130 10*3/uL — ABNORMAL LOW (ref 150–400)
RDW: 14.2 % (ref 11.5–15.5)

## 2010-10-05 LAB — BASIC METABOLIC PANEL
BUN: 15 mg/dL (ref 6–23)
CO2: 26 mEq/L (ref 19–32)
Chloride: 104 mEq/L (ref 96–112)
Glucose, Bld: 97 mg/dL (ref 70–99)
Potassium: 4.1 mEq/L (ref 3.5–5.1)

## 2010-10-11 ENCOUNTER — Ambulatory Visit (INDEPENDENT_AMBULATORY_CARE_PROVIDER_SITE_OTHER): Payer: Medicare Other | Admitting: Cardiology

## 2010-10-11 ENCOUNTER — Encounter: Payer: Self-pay | Admitting: *Deleted

## 2010-10-11 ENCOUNTER — Ambulatory Visit (INDEPENDENT_AMBULATORY_CARE_PROVIDER_SITE_OTHER): Payer: Medicare Other | Admitting: *Deleted

## 2010-10-11 VITALS — BP 112/77 | HR 69 | Ht 71.0 in | Wt 211.0 lb

## 2010-10-11 DIAGNOSIS — R0602 Shortness of breath: Secondary | ICD-10-CM

## 2010-10-11 DIAGNOSIS — I4892 Unspecified atrial flutter: Secondary | ICD-10-CM

## 2010-10-11 DIAGNOSIS — Z7901 Long term (current) use of anticoagulants: Secondary | ICD-10-CM

## 2010-10-11 DIAGNOSIS — R002 Palpitations: Secondary | ICD-10-CM

## 2010-10-11 DIAGNOSIS — I2510 Atherosclerotic heart disease of native coronary artery without angina pectoris: Secondary | ICD-10-CM

## 2010-10-11 LAB — POCT INR: INR: 2.4

## 2010-10-11 MED ORDER — AMIODARONE HCL 200 MG PO TABS: 200.0000 mg | ORAL_TABLET | Freq: Every day | ORAL | Status: DC

## 2010-10-11 NOTE — Progress Notes (Addendum)
HPI  The patient is a 77 year old male with a history of atrial flutter and tachycardia-induced cardiomyopathy. He status post radio catheter frequency ablation for atrial flutter. His ejection fraction normalized to 65%. However shortly after his flutter ablation he had recurrent atypical flutter and was placed on amiodarone. He has remained in normal sinus rhythm and also on Coumadin. He was seen in June of last year as well in December of last year and he had remained in normal sinus rhythm. The patient however placed a phone call yesterday stating that he felt dizzy and weak. He felt that his heart was racing. He was experiencing palpitations. He felt he had similar symptoms at that time when he was in atrial flutter. Previously the patient was never cardioverted to normal sinus rhythm and by TEE on initial presentation was that of a left atrial appendage thrombus. Of note is also that approximately 5 weeks ago the patient was off Coumadin for approximately one week for dental procedure. I asked the patient to come for a visit today. His electrocardiac shows it is an atypical flutter with a heart rate of approximately 110 to 115 beats per minute. He denies any chest pain he does have shortness of breath on exertion. It has decreased stamina.  Allergies  Allergen Reactions  . Penicillins   . Sulfonamide Derivatives     Current Outpatient Prescriptions on File Prior to Visit  Medication Sig Dispense Refill  . warfarin (COUMADIN) 5 MG tablet Take by mouth as directed.          No past medical history on file.  No past surgical history on file.  No family history on file.  History   Social History  . Marital Status: Married    Spouse Name: N/A    Number of Children: N/A  . Years of Education: N/A   Occupational History  . Not on file.   Social History Main Topics  . Smoking status: Never Smoker   . Smokeless tobacco: Not on file  . Alcohol Use: Not on file  . Drug Use: Not on  file  . Sexually Active: Not on file   Other Topics Concern  . Not on file   Social History Narrative  . No narrative on file   Review of systems:Pertinent positives as outlined above. The remainder of the 18  point review of systems is negative   PHYSICAL EXAM BP 112/77  Pulse 69  Ht 5\' 11"  (1.803 m)  Wt 211 lb (95.709 kg)  BMI 29.43 kg/m2  SpO2 98%  General: Well-developed, well-nourished in no distress Head: Normocephalic and atraumatic Eyes:PERRLA/EOMI intact, conjunctiva and lids normal Ears: No deformity or lesions Mouth:normal dentition, normal posterior pharynx Neck: Supple, no JVD.  No masses, thyromegaly or abnormal cervical nodes Lungs: Normal breath sounds bilaterally without wheezing.  Normal percussion Cardiac: Irregular rate and rhythm with normal S1 and S2, no S3 or S4.  PMI is normal.  No pathological murmurs Abdomen: Normal bowel sounds, abdomen is soft and nontender without masses, organomegaly or hernias noted.  No hepatosplenomegaly MSK: Back normal, normal gait muscle strength and tone normal Vascular: Pulse is normal in all 4 extremities Extremities: No peripheral pitting edema Neurologic: Alert and oriented x 3 Skin: Intact without lesions or rashes Lymphatics: No significant adenopathy Psychologic: Normal affect  ECG: Atypical flutter with a heart rate of 110 beats per minute, confirmed by second electrocardiogram.  ASSESSMENT AND PLAN   The patient was seen in followup. On Monday, 10/16/2010  a followup EKG showed that he remained in atrial fibrillation. Therefore he will be scheduled for cardioversion later this week. The patient is aware of this and understand the risks and benefits of the procedure and we'll proceed.

## 2010-10-11 NOTE — Assessment & Plan Note (Signed)
Likely related to his recurrent arrhythmia.

## 2010-10-11 NOTE — Patient Instructions (Signed)
   INR check today - 2.4  Begin Amiodarone 400mg  daily x 5 days, then 200mg  daily - first dose given by Dr. Andee Lineman in the office today. Your physician recommends that you go to the [facility] for lab work on Friday, 4/20 - BMET, TSH, & PT/INR. Nurse visit on Monday, 4/23 for EKG, if back in normal sinus rhythm - will not proceed with cardioversion.

## 2010-10-11 NOTE — Assessment & Plan Note (Signed)
No evidence of heart failure symptoms. No further changes in medical therapy.

## 2010-10-11 NOTE — Assessment & Plan Note (Addendum)
The patient converted back to atrial flutter with rapid rate. He was a little low dose of amiodarone 100 mg a day. He has been briefly off Coumadin greater than 5 weeks ago but his last PT INR in the office recently was only 1.9. I asked the patient to take 400 mg of amiodarone today and to do this for the next 5 days. Then as of Monday we did start 200 mg a day. It is possible that he will convert spontaneously to normal sinus rhythm but anticipate that he will need a cardioversion. Given his prior history of left atrial appendage thrombus and the fact that she has been briefly off Coumadin roughly 5 weeks ago, I feel it is prudent to proceed with TEE guided cardioversion. This will be scheduled tentatively for Monday. We'll also pending blood work on Friday including a PT INR, electrolyte panel and TSH the latter to make sure he has not developed thyroid abnormalities prompting this new arrhythmia.  As noted above the patient remained in atrial flutter and we will proceed with cardioversion later this week. He is adequately anticoagulated. I discussed the risks and benefits of a TEE guided cardioversion with the patient and he is willing to proceed.

## 2010-10-16 ENCOUNTER — Ambulatory Visit (INDEPENDENT_AMBULATORY_CARE_PROVIDER_SITE_OTHER): Payer: Medicare Other | Admitting: *Deleted

## 2010-10-16 ENCOUNTER — Encounter: Payer: Self-pay | Admitting: *Deleted

## 2010-10-16 VITALS — Ht 71.0 in | Wt 210.0 lb

## 2010-10-16 DIAGNOSIS — Z7901 Long term (current) use of anticoagulants: Secondary | ICD-10-CM

## 2010-10-16 DIAGNOSIS — I4892 Unspecified atrial flutter: Secondary | ICD-10-CM

## 2010-10-16 NOTE — Patient Instructions (Addendum)
   Will move forward with TEE/Cardioversion - 4/27

## 2010-10-17 ENCOUNTER — Encounter: Payer: Self-pay | Admitting: Cardiology

## 2010-10-20 DIAGNOSIS — I4892 Unspecified atrial flutter: Secondary | ICD-10-CM

## 2010-10-20 LAB — PROTIME-INR

## 2010-10-27 ENCOUNTER — Ambulatory Visit (INDEPENDENT_AMBULATORY_CARE_PROVIDER_SITE_OTHER): Payer: Medicare Other | Admitting: *Deleted

## 2010-10-27 DIAGNOSIS — I4892 Unspecified atrial flutter: Secondary | ICD-10-CM

## 2010-10-27 DIAGNOSIS — Z7901 Long term (current) use of anticoagulants: Secondary | ICD-10-CM

## 2010-10-31 ENCOUNTER — Encounter: Payer: Medicare Other | Admitting: *Deleted

## 2010-11-01 ENCOUNTER — Other Ambulatory Visit: Payer: Self-pay | Admitting: Cardiology

## 2010-11-03 ENCOUNTER — Ambulatory Visit (INDEPENDENT_AMBULATORY_CARE_PROVIDER_SITE_OTHER): Payer: Medicare Other | Admitting: *Deleted

## 2010-11-03 DIAGNOSIS — I4892 Unspecified atrial flutter: Secondary | ICD-10-CM

## 2010-11-03 DIAGNOSIS — Z7901 Long term (current) use of anticoagulants: Secondary | ICD-10-CM

## 2010-11-07 NOTE — Assessment & Plan Note (Signed)
[facility]                          [facility] NOTE   [name], [name]                       MRN:          [id number]  DATE:11/03/2008                            DOB:          11-Jan-1934    REFERRING PHYSICIAN:  Ernestine Conrad, MD   HISTORY OF PRESENT ILLNESS:  The patient is a 77 year old male with a  history of atrial flutter and possibly tachycardia-induced  cardiomyopathy.  The patient by TEE also have documented left atrial  appendage clots.  The patient converted to normal sinus rhythm, but  following this, underwent a radio catheter frequency ablation.  The  patient has been doing well.  He has remained on Coumadin.  Last time  when he saw Dr. Graciela Husbands, he felt somewhat sluggish with decreased exercise  tolerance, his amiodarone was discontinued, and the patient states that  he now feels better.  He has no shortness breath, orthopnea, PND,  palpitations, or syncope.  He was wondering if he could come off his  Coumadin today.   MEDICATIONS:  1. Flomax 0.4 mg p.o. daily.  2. Sulfa.  3. Omega 1 tab p.o. daily.  4. Fish oil 1000 mg p.o. daily.  5. Flaxseed oil 500 mg p.o. daily.  6. Multivitamin.  7. Xalatan.  8. Coumadin as directed.  9. Lisinopril 5 mg p.o. daily.  10.Os-Cal.  11.Vitamin D daily.   PHYSICAL EXAMINATION:  VITAL SIGNS:  Blood pressure 126/64, heart rate  61, weight 199 pounds.  GENERAL:  A well-nourished white male in no apparent distress.  HEENT:  Pupils are isochoric.  Conjunctivae are clear.  NECK:  Supple.  Normal carotid upstroke and no carotid bruits.  LUNGS:  Clear breath sounds bilaterally.  HEART:  Regular rate and rhythm.  Normal S1 and S2.  No murmur, rubs, or  gallops.  ABDOMEN:  Soft, nontender.  No rebound or guarding.  Good bowel sounds.  EXTREMITIES:  No cyanosis, clubbing, or edema.   PROBLEM LIST:  1. Status post radio catheter frequency ablation for atrial flutter.  2. Amiodarone  discontinued for exercise intolerance with resolution of      bradycardia.  3. Status post transesophageal echocardiography with left atrial      appendage clots, on Coumadin.  4. Left ventricular dysfunction, ejection fraction 35-40% without      clinical heart failure.   PLAN:  1. The patient notes to me that his abdomen feels somewhat swollen.      Dr. Graciela Husbands told him he has a hernia, although clinically there does      not appear to be a hernia present anymore, this was corrected      several years ago.  2. We will make sure the patient does not have any heart failure      symptoms and particularly his ejection fraction is still in the low      range.  The addition of a diuretic might be helpful.  3. I will not stop the patient's Coumadin yet as it has not been 3      months.  We could potentially do  this next month provided his      ejection fraction is within normal limits.  I do not think that the      patient has had intercurrent flutter or atrial fibrillation,      although that uncertainty always remains, but he denies any      palpitations.  If the ejection fraction is not normalized, we will      continue his Coumadin anticoagulation for a total of 6 months.     Learta Codding, MD,FACC  Electronically Signed    GED/MedQ  DD: 11/03/2008  DT: 11/03/2008  Job #: [id number]   cc:   Ernestine Conrad, MD

## 2010-11-07 NOTE — Letter (Signed)
September 01, 2008    Learta Codding, MD,FACC  [address]  [place], [place] [address]   RE:  CARNEY, SAXTON  MRN:  [id number]  /  DOB:  12-Dec-1933   Dear Michelle Piper:   It was a pleasure to see Coren Sagan today at your request.  As you  will recall, he is 77 year old gentleman who presented to [facility] in atrial flutter with 2:1 conduction.  He had been having  intermittent symptoms for about a year and these episodes have been  lasting 1 or 2 days until just prior to presentation, the episode lasted  at least a week and it may be more.  It was characterized by increasing  fatigue, dyspnea on exertion less than 100 feet, nocturnal dyspnea, and  lightheadedness.  There was some accompanied chest discomfort.   He was found to be in flutter, 2:1.  He underwent TEE with anticipated  cardioversions.  He noted a clot in the left atrial appendage and rate  control, which was initially quite difficult.  It was then accomplished  by the spontaneous reversion to sinus rhythm.  His amiodarone and Toprol  were maintained.  He has had some degree of bradycardia, but he is  feeling that he could deal better.   The other notable was that his ejection fraction was 30-35% on TEE, the  cause of which was unknown, but hopefully was related to his  tachycardia.   Thromboembolic risk factors were notable for age and hypertension.  He  has had no evidence of systemic embolization.   PAST MEDICAL HISTORY:  In addition to the above is notable for prostatic  hypertrophy.   PAST SURGICAL HISTORY:  Notable for total knee replacement, umbilical  hernia repair, and TURP.   FAMILY HISTORY:  Noncontributory.   REVIEW OF SYSTEMS:  As per the HPI and past medical history.  Otherwise  negative.   ALLERGIES:  He is allergic to PENICILLIN and SULFA.   MEDICATIONS:  Include  1. Metoprolol 12.5 b.i.d.  2. Amiodarone 200 b.i.d.  3. Lisinopril 5.  4. Xalatan eye drops on the left.  5. Flomax 0.4.   SOCIAL HISTORY:  He is married.  He is retired as a Architect.  He  has one child.  He does not use cigarettes, alcohol, or recreational  drugs.   PHYSICAL EXAMINATION:  GENERAL:  He is an elderly Caucasian male  appearing his stated age, somewhat older than 15.  VITAL SIGNS:  His blood pressure is 127/67.  His pulse was 46.  His  weight was 206.  HEENT:  Demonstrated no icterus or xanthoma.  AIRWAY #2  NECK:  Neck veins were flat.  Carotids were brisk and full bilaterally  without bruits.  BACK:  Without kyphosis.  There was suggestion of scoliosis and there  was a fullness to his right low back that was both visual and palpable  HEART:  Heart sounds were regular, but slow.  There is a 2/6 murmur and  S4.  ABDOMEN:  Soft with active bowel sounds.  EXTREMITIES:  Femoral pulses were 2+.  Distal pulses were intact.  There  is no clubbing, cyanosis, or edema.  NEUROLOGICAL:  Grossly normal.  SKIN:  Warm and dry.   Electrocardiogram that you sent was reviewed and demonstrated atrial  flutter that was typical with a rate of 145.   Electrocardiogram today demonstrated sinus rhythm of 46 with intervals  of 0.22/0.08/0.48.  Axis was normal  at 51 degrees.   His INR apparently today was subtherapeutic, as it was recommended that  his Coumadin level be increased.  His echo report was reviewed.  He has  mild pulmonary hypertension.  Chemistries were notable for normal  creatinine.  HDL is 19.   IMPRESSION:  1. Atrial flutter - Typical.  2. Congestive heart failure associated with atrial flutter-Typical.  3. Left ventricular dysfunction potentially related to prolonged      atrial flutter.  4. Left atrial appendage clot.  5. Spontaneous reversion to sinus rhythm, currently on amiodarone To      maintain sinus rhythm.  6. Thromboembolic risk factors notable for age and hypertension.   Michelle Piper, Mr. Broad has atrial flutter that is typical.  He had significant  recurrences prior to  presentation.  I wonder whether this contributed to  his cardiomyopathy.  I think we should take advantage of his being in  sinus rhythm and proceed with catheter ablation.  His INR is a little bit low at this point and we will plan to push it  up.   I also would like him off the amiodarone as he is having significant  bradycardia.   I have reviewed with the family potential benefits as well as potential  risks including, but not limited to death, perforation, and bleeding.  They understand these risks and would like to proceed.   The hope is that with time that his LV function will return towards  normal.   We will plan to proceed on Friday, Michelle Piper.   Thanks very much for consultation.    Sincerely,      Duke Salvia, MD, [facility]  Electronically Signed    SCK/MedQ  DD: 09/01/2008  DT: 09/02/2008  Job #: [id number]

## 2010-11-07 NOTE — Discharge Summary (Signed)
NAMEFERAS, GARDELLA NO.:  [id number]   MEDICAL RECORD NO.:  [id number]          PATIENT TYPE:  OIB   LOCATION:  2924                         FACILITY:  [facility]   PHYSICIAN:  Duke Salvia, MD, FACCDATE OF BIRTH:  09/04/1933   DATE OF ADMISSION:  09/09/2008  DATE OF DISCHARGE:  09/10/2008                               DISCHARGE SUMMARY   FINAL DIAGNOSES:  1. Atrial flutter 2:1 conduction with symptoms which produced acute      onset of chronic congestive heart failure requiring      hospitalization.  2. Discharging day #1, status post electrophysiology study,      radiofrequency catheter ablation of a typical counterclockwise      isthmus-dependent atrial flutter.  A cavotricuspid isthmus block      was performed with bidirectional block achieved, Dr. Sherryl Manges.   SECONDARY DIAGNOSES:  1. History of congestive heart failure associated with atrial flutter.  2. Left ventricular dysfunction potentially related to prolonged      atrial flutter, ejection fraction 30-35% on echocardiogram.  3. Left atrial appendage clot.  4. Spontaneous reversion to sinus rhythm at hospitalization for      congestive heart failure.  The patient currently on amiodarone      therapy.  5. Thromboembolic risk factors include age and hypertension.   PROCEDURES:  September 09, 2008 electrophysiology study, radiofrequency  catheter ablation of atypical counterclockwise atrial flutter.  The  patient maintaining sinus bradycardia.  After the procedure, medications  adjustment at discharge.   BRIEF HISTORY:  Mr. Slayton is a 77 year old male.  He presented to  [facility] with atrial flutter in 2:1 conduction.  He has been  having intermittent symptoms for about a year, they lasts 1-2 days;  however, the latest presentation which required hospitalization may have  lasted more than a week.  It was characterized by fatigue, dyspnea,  exertion at less than 100 feet.  Electrocardiogram  on admission was  shown atrial flutter, 2:1 conduction.  He had transesophageal  echocardiogram anticipating cardioversion.  The study showed clot in the  left atrial appendage.  The patient was such treated with amiodarone and  Toprol and converted spontaneously on these pharmacological therapies.  The ejection fraction at the TEE was 30-35% with unclear etiology.  The  patient's past medical history also includes prostatic hypertrophy, and  total knee replacement, umbilical hernia repair, and a TURP.  The  patient is allergic to PENICILLIN and SULFA.   HOSPITAL COURSE:  The patient presents electively.  He is in sinus  rhythm at the time of presentation.  He underwent electrophysiology  study with ablation of a counterclockwise typical atrial flutter.  The  patient has been sinus bradycardic.  After the procedure, metoprolol has  been stopped, amiodarone has been decreased from 200 mg twice daily.  The patient continues on Coumadin for 4 weeks and has followup.  Coumadin dosing chart has been given to the patient and his wife with  detail explanation as to what to do in the next 3 days for dosing since  his Coumadin is subtherapeutic.  He is to stop metoprolol.   DISCHARGE MEDICATIONS:  1. Amiodarone 1 tablet daily, a new dose.  2. Lisinopril 5 mg daily.  3. Flomax 0.4 mg daily.  4. Xalatan eye drops daily.  5. Fish oil caps 1000 mg daily.  6. Multivitamin daily.  7. ICAP daily.  8. Flaxseed oil 1000 mg daily.  9. Os-Cal with vitamin D 500 mg daily.  10.Udamin 2 tablets daily.   DISCHARGE INSTRUCTIONS:  1. He follows up with the [facility] Monday, September 13, 2008 for      blood work, protime which will we sent to the [facility],      Coumadin Clinic for analysis.  2. To see Dr. Graciela Husbands, [facility], [facility], Friday, October 15, 2008 at 2:15 and he will see Dr. Andee Lineman, Wednesday, Nov 03, 2008 at 9:30.  3. He is to call the office if he feels that he  has gone back in      atrial flutter or come to the emergency room if he is having      trouble breathing with a recurrence of atrial flutter.   LABORATORY STUDIES THIS ADMISSION:  Hemoglobin 15.8, hematocrit 45.9,  white cells 7.7, platelets of 130.  Serum electrolytes; sodium 138,  potassium 4.1, chloride 104, carbonate 26, BUN is 15, creatinine 1.11,  glucose 97.  Protime at the day of discharge 18.9, INR 1.5.      Maple Mirza, [place]      Duke Salvia, MD, [facility]  Electronically Signed    GM/MEDQ  D:  09/10/2008  T:  09/11/2008  Job:  [id number]   cc:   Learta Codding, MD,FACC  Ernestine Conrad, M.D.

## 2010-11-07 NOTE — Op Note (Signed)
NAMEALESSANDER, [name] NO.:  [id number]   MEDICAL RECORD NO.:  [id number]          PATIENT TYPE:  OIB   LOCATION:  2924                         FACILITY:  [facility]   PHYSICIAN:  Duke Salvia, MD, FACCDATE OF BIRTH:  1934/03/23   DATE OF PROCEDURE:  09/09/2008  DATE OF DISCHARGE:  09/10/2008                               OPERATIVE REPORT   PREOPERATIVE DIAGNOSIS:  Atrial flutter.   POSTOPERATIVE DIAGNOSIS:  Atrial flutter/atrial fibrillation.   PROCEDURE:  Invasive electrophysiological study, ibutilide  cardioversion, and radiofrequency catheter ablation.   Following obtaining informed consent, the patient was brought to the  Electrophysiology Laboratory and placed on the fluoroscopic table in the  supine position.  After routine prep and drape, cardiac catheterization  was performed with local anesthesia and conscious sedation.  Noninvasive  blood pressure monitoring, transcutaneous oxygen saturation monitoring,  and end-tidal CO2 monitoring were performed continuously throughout the  procedure.  Following procedure, the catheters were removed.  Hemostasis  was obtained.  The patient was transferred to floor in stable condition.   CATHETERS:  1. A 5-French quadripolar catheter was inserted via the left femoral      vein to the AV junction.  2. A 6-French octapolar catheter was inserted via the right femoral      vein to the coronary sinus.  3. A 7-French dual decapolar catheter was inserted via the left      femoral vein to the tricuspid annulus.  4. A 7-French 8-mm deflectable tip ablation catheter was inserted via      the SL2 sheath initially and then via SAFL sheath through the right      femoral vein to mapping sites in the posterior septal space.   Surface leads 1, aVF, and V1 were monitored continuously throughout the  procedure.  Following insertion of the catheters, stimulation protocol  included:  1. Incremental atrial pacing.  2. Incremental  ventricular pacing.  3. Single-atrial extrastimuli at paced cycle length of 600      milliseconds.     RESULTS Initial:  Rhythm:  Sinus; RR interval 472 milliseconds ?  PR interval 189  milliseconds.  QRS duration:  106 milliseconds; P-wave duration 92  milliseconds; AH interval 122 milliseconds; AH interval 42 milliseconds.  \  Final rhythm was sinus.   AV nodal conduction was noted to be continuous.   No accessory pathway was identified.   During coronary sinus pacing, atrial fibrillation developed.  We ended  having to give the patient ibutilide and cardioverted to restore sinus  rhythm and this allowed for cavotricuspid isthmus mapping ablation.   A total of 25 minutes and 50 seconds of RF was applied across the  cavotricuspid isthmus with successful interruption of bidirectional  conduction.  Total fluoroscopy time was 14 minutes.   The patient tolerated the procedure well with successful cavotricuspid  isthmus ablation.      Duke Salvia, MD, [facility]  Electronically Signed     SCK/MEDQ  D:  12/09/2008  T:  12/09/2008  Job:  [id number]

## 2010-11-07 NOTE — Assessment & Plan Note (Signed)
[facility]                          [facility] NOTE   ANIL, HAVARD                       MRN:          [id number]  DATE:12/20/2008                            DOB:          25-Sep-1933    REFERRING PHYSICIAN:  Dr. Ernestine Conrad   HISTORY OF PRESENT ILLNESS:  The patient is a 77 year old male with a  history of atrial flutter and tachycardia-induced cardiomyopathy.  The  ejection fraction normalized at 60-65%.  Initially, he underwent radio  catheter frequency ablation for type 1 flutter.  Postprocedure,  amiodarone was discontinued, and the patient was found to be back in  atypical flutter on Nov 18, 2008.  He was also brought to the emergency  room on one occasion with IV amiodarone and was started back to normal  sinus rhythm.  When I saw him back on Nov 18, 2008, the patient again  was in atrial flutter.  I increased his amiodarone to 400 mg a day and  today, the patient is not back in normal sinus rhythm.  His heart rhythm  was slow with heart rate of 45 beats per minute.  He states that he  feels very well, had no shortness of breath.  I did start him on Lasix  20 mg p.o. daily in the meanwhile due to some heart failure symptoms.  I  feel that his exercise level is back up to par.  The patient also  reports no chest pain.   MEDICATIONS:  1. Amiodarone 200 mg p.o. b.i.d.  2. Furosemide 20 mg p.o. daily.  3. Metoprolol tartrate 25 mg half a tablet p.o. b.i.d.  4. Os-Cal daily.  5. Lisinopril 5 mg p.o. daily.  6. Coumadin as directed.  7. Xalatan eye drops.  8. Multivitamin.  9. Flaxseed oil.  10.Fish oil.  11.Saw palmetto.  12.Flomax.   PHYSICAL EXAMINATION:  VITAL SIGNS:  Blood pressure is 126/70, heart  rate is 44, and weight is 102 pounds.  GENERAL:  A well-nourished white male, in no apparent distress.  HEENT:  Pupils, eyes are clear; conjunctivae clear.  NECK:  Supple.  Normal carotid upstroke.  No carotid bruits.  LUNGS:  Clear breath sounds bilaterally.  HEART:  Regular rate and rhythm, bradycardic.  ABDOMEN:  Soft and nontender.  No rebound or guarding.  Good bowel  sounds.  EXTREMITIES:  No cyanosis, clubbing, or edema.  NEUROLOGIC:  The patient is alert, oriented, and grossly nonfocal.   PROBLEMS:  1. Status post radio catheter frequency ablation for typical flutter.  2. Recurrent atypical flutter back in normal sinus rhythm, on      amiodarone.  3. Status post TEE with left atrial appendage clots, on Coumadin.  4. Left ventricular dysfunction, improved to 60-65%.   PLAN:  1. The patient is now back in normal sinus rhythm.  We will continue      amiodarone at a lower dose, 200 mg p.o. daily.  2. The patient will have blood work done including BUN, creatinine,      potassium, free T4 and TSH.  Per the patient's request, I  added      also PSA.  3. Given the fact that the patient had left atrial appendage clots, I      would like to leave him on Coumadin, longterm as he does have an      atypical flutter, which is only medically managed.  4. The patient will follow up with [place] in 6 months.     Learta Codding, MD,FACC  Electronically Signed    GED/MedQ  DD: 12/20/2008  DT: 12/21/2008  Job #: [id number]   cc:   Ernestine Conrad, MD

## 2010-11-07 NOTE — Assessment & Plan Note (Signed)
[facility]                          [facility]   TALAN, [name]                       MRN:          [id number]  DATE:11/18/2008                            DOB:          [birth year]    REFERRING PHYSICIAN:  Dr. Loney Hering   HISTORY OF PRESENT ILLNESS:  The patient is a pleasant 77 year old male  with history of atrial flutter and tachycardia-induced cardiomyopathy.  His ejection fraction has now normalized to 60-65%.  The patient  underwent a radiocatheter frequency ablation for type 1 flutter.  However, post-procedure amiodarone was discontinued and the patient was  back in now in atypical flutter possibly left-sided flutter.  During the  last clinic visit, we brought him to the emergency room, gave him IV  amiodarone with restoration of normal sinus rhythm.  Unfortunately,  today he is now back in atrial flutter and his INR is subtherapeutic at  1.5.  He is on low-dose amiodarone 100 mg p.o. daily.   The patient states that he feels somewhat shaky and short of breath on  exertion.  He, however, has noticed chest pain.  His symptoms are  reminiscent when he was in rapid atrial flutter, although his heart  rates are under much better control, hence he has improved ejection  fraction.   MEDICATIONS:  1. Flomax 0.4 mg p.o. daily.  2. Saw Palmetto 1 tablet p.o. b.i.d.  3. Fish oil 1000 one p.o. daily.  4. Flaxseed oil 1000 mg p.o. daily.  5. Multivitamin.  6. Xalatan eye drops.  7. Coumadin as directed 5 mg a day.  8. Lisinopril 5 mg p.o. daily.  9. Os-Cal 500 mg plus vitamin D.  10.Amiodarone 100 mg p.o. daily.   PHYSICAL EXAMINATION:  VITAL SIGNS:  Blood pressure 101/65, heart rate  is 69, weight is 223 pounds.  By EKG, however, the patient's heart rate  is 112.  GENERAL:  Well-nourished white male in no apparent distress.  HEENT:  Pupils, eyes are equal.  Conjunctivae clear.  NECK:  Supple.  No carotid upstroke.  No carotid  bruits.  LUNGS:  Clear breath sounds bilaterally.  HEART:  Irregular heart rate with normal S1 and S2.  No pathological  murmurs.  ABDOMEN:  Soft, nontender.  No rebound or guarding.  Good bowel sounds.  EXTREMITIES:  No cyanosis, clubbing, or edema.   PROBLEMS:  1. Status post radiocatheter frequency ablation for typical flutter.  2. Recurrent atypical flutter converted with amiodarone, but now      recurrent.  3. Status post TE with left atrial appendage clots on Coumadin.  4. Left ventricular dysfunction, ejection fraction improved from 35 to      60-65%.   PLAN:  1. We will increase the patient's amiodarone from 100 to 400 mg a day.      We will have him back next week for a followup EKG.  He may well      convert back to normal sinus rhythm on the higher-dose amiodarone.  2. If the patient does not convert, we will keep him 4 weeks on  Coumadin, and then we will cardiovert him electively.  He will also      then be maintained on a higher dose of amiodarone, initially 400 mg      a day for a month and then cut back to 200 mg a day thereafter.  3. The patient will have also TSH and a free T4 done and a PT/INR      early next week, when he comes for an RN visit.     Learta Codding, MD,FACC  Electronically Signed    GED/MedQ  DD: 11/18/2008  DT: 11/19/2008  Job #: [id number]   cc:   Dr. Loney Hering

## 2010-11-07 NOTE — Cardiovascular Report (Signed)
[facility]                   [place] [facility]ROP[facility] DEVICE CLINIC NOTE   [name], [name]                       MRN:          [id number]  DATE:10/15/2008                            DOB:          [birth year]    [name] is seen in followup for an atrial flutter ablation.  He had  left atrial appendage clot at that time and he was put on amiodarone for  rate control.  He has done well, but he has been feeling rather sluggish  with decreased exercise tolerance.  He has had no peripheral edema.   MEDICATIONS:  1. Amiodarone at 200 mg a day.  2. Lisinopril 5.  3. Coumadin.  4. Eyedrops.  5. Nutraceuticals.   PHYSICAL EXAMINATION:  VITAL SIGNS:  His blood pressure was well  controlled at 133/67, his pulse was 59, and his weight was 201.  NECK:  Veins were flat.  LUNGS:  Clear.  HEART:  Sounds were regular without murmurs or gallops.  ABDOMEN:  Soft.  EXTREMITIES:  No edema.   Electrocardiogram today demonstrated sinus rhythm at 53 with intervals  of 0.20/0.08/0.43.  The axis was normal at 27 degrees.   IMPRESSION:  1. Atrial flutter status post ablation.  2. Amiodarone initiated for #1 beat and fast.  3. Bradycardia, question related to #2.  4. Left atrial appendage clot, on Coumadin.  5. Left ventricular dysfunction with an ejection fraction of 35-40%.   DISCUSSION:  [name] is doing quite well.  He is to see [name]  in 3 weeks' time.  I wonder to what degree the fatigue is related to the  bradycardia and to what degree the consequence of the amiodarone.  We  will plan to discontinue the amiodarone today and hopefully his heart  rate will be in the 60s.  When he returns, his energy level will be  improved.   The other issue will be the duration of his Coumadin.  We will defer  this to [name], as I am not sure how long it should be maintained.  My guess is somewhere in the 65-month range in the  absence of a transesophageal  echocardiography confirming dissolution of  the left atrial appendage clot.   We will see him again at [name] request.     Duke Salvia, MD, [facility]  Electronically Signed    SCK/MedQ  DD: 10/15/2008  DT: 10/16/2008  Job #: [id number]

## 2010-11-10 ENCOUNTER — Ambulatory Visit (INDEPENDENT_AMBULATORY_CARE_PROVIDER_SITE_OTHER): Payer: Medicare Other | Admitting: *Deleted

## 2010-11-10 DIAGNOSIS — Z7901 Long term (current) use of anticoagulants: Secondary | ICD-10-CM

## 2010-11-10 DIAGNOSIS — I4892 Unspecified atrial flutter: Secondary | ICD-10-CM

## 2010-11-10 LAB — POCT INR: INR: 2.5

## 2010-11-10 NOTE — Op Note (Signed)
[name], [name] NO.:  [id number]   MEDICAL RECORD NO.:  [id number]                   PATIENT TYPE:  AMB   LOCATION:  DSC                                  FACILITY:  [facility]   PHYSICIAN:  Velora Heckler, M.D.                DATE OF BIRTH:  02/29/1934   DATE OF PROCEDURE:  03/23/2002  DATE OF DISCHARGE:                                 OPERATIVE REPORT   PREOPERATIVE DIAGNOSES:  Umbilical hernia.   POSTOPERATIVE DIAGNOSES:  Umbilical hernia.   OPERATION PERFORMED:  Repair of umbilical hernia with Prolene mesh.   SURGEON:  Velora Heckler, M.D.   ANESTHESIA:  General.   ESTIMATED BLOOD LOSS:  Minimal.   PREPARATION:  Betadine.   COMPLICATIONS:  None.   INDICATIONS FOR PROCEDURE:  The patient is a 77 year old white male referred  by Dr. Wende Crease in [place], [place] for repair of abdominal wall  hernia.  This has been present for approximately one year.  It is always  been reducible.  The patient now comes to surgery for repair.   DESCRIPTION OF PROCEDURE:  The procedure was done in operating room # 8 at  the [facility].  The patient was brought to the operating  room and placed in supine position on the operating room table.  Following  administration of general anesthesia, the patient was positioned and then  prepped and draped in the usual strict aseptic fashion.  After ascertaining  that an adequate level of anesthesia had been obtained, an infraumbilical  incision was made with a #15 blade.  Dissection was carried down through  subcutaneous tissues and hemostasis was obtained with the electrocautery.  The fascial plane was identified and developed circumferentially around the  umbilicus.  The umbilical defect was then opened.  It contained incarcerated  omentum.  This was reduced back within the peritoneal cavity.  Fascial edges  were freshened.  The defect measured approximately 1 cm.  The defect was  closed  with interrupted 0 Ethibond sutures.  A sheet of Prolene mesh  measuring approximately 4 x 4 cm was then placed over the closure.  It was  secured circumferentially with interrupted 0 Ethibond sutures.  The  umbilicus was then reaffixed to the midportion of the wound with an  interrupted 0 Ethibond suture.  Good hemostasis was noted.  Subcutaneous  tissues were closed with interrupted 3-0 Vicryl sutures.  Skin edges were  anesthetized with local anesthetic.  Skin edges were reapproximated with  interrupted 4-0 Vicryl subcuticular sutures.  The wound was  washed and dried and benzoin and Steri-Strips were applied.  Sterile gauze  dressings were applied.  The patient was awakened from anesthesia and  brought to the recovery room in stable condition.  The patient tolerated the  procedure well.  Velora Heckler, M.D.    TMG/MEDQ  D:  03/23/2002  T:  03/23/2002  Job:  [id number]   cc:   Wende Crease, M.D.

## 2010-11-10 NOTE — Op Note (Signed)
[name], [name] NO.:  [id number]   MEDICAL RECORD NO.:  [id number]                   PATIENT TYPE:  INP   LOCATION:  2550                                 FACILITY:  [facility]   PHYSICIAN:  Loreta Ave, M.D.              DATE OF BIRTH:  December 27, 1933   DATE OF PROCEDURE:  08/03/2002  DATE OF DISCHARGE:                                 OPERATIVE REPORT   PREOPERATIVE DIAGNOSIS:  End-stage degenerative arthritis right knee with  marked flexion contracture and varus alignment.   POSTOPERATIVE DIAGNOSIS:  End-stage degenerative arthritis right knee with  marked flexion contracture and varus alignment.   PROCEDURE:  Right total knee replacement with soft tissue balancing. Removal  of numerous loose bodies.  Posterior cemented microstructured #11 femoral  component PS type, cemented #11 tibial component with 10 mm polyethylene  insert PS flex type, cemented recessed non-metal back 30 mm patellar  component.   SURGEON:  Loreta Ave, M.D.   ASSISTANT:  Arlys John D. Petrarca, P.A.-C.   ANESTHESIA:  General.   ESTIMATED BLOOD LOSS:  Minimal.   TOURNIQUET TIME:  1 hour 20 minutes.   SPECIMENS:  Excised bone and soft tissue.  Cultures; none.   COMPLICATIONS:  None.  Dressing; soft compressive. Drains; Hemovac x2.   DESCRIPTION OF PROCEDURE:  The patient was brought to the operating room and  placed on the operating table in the supine position. After adequate  anesthesia had been obtained, the right knee examined. 7 to 8 degree fixed  flexion contracture. Further flexion about 90 degrees. Alignment more than 5  degrees of varus, not very correctable. Stable ligaments.  Tourniquet  applied.  Prepped and draped in the usual sterile fashion.  Exsanguinated  with elevation of Esmarch. Tourniquet inflated to 350 mmHg. Straight  incision above the patella down to the tibial tubercle. A thickened  prepatellar bursa resection. Medial parapatellar  arthrotomy. Knee exposed.  Grade IV changes throughout.  Medial capsule released.  Numerous loose  bodies were removed.  Remnants of menisci, cruciate ligaments removed.  Periarticular spurs removed. Distal femur exposed.  Intramedullary guide  placed.  Distal cuts set at 5 degrees of valgus removing 10 mm. Sized to a  #11 component. Jig was put in place and definitive cuts made. Trial put in  place and found to fit well. Trial removed. Tibia exposed.  Tibial spine  removed with a saw. Proximal cut removing 6 mm with a 5 degree posterior  slope cut perpendicular to the shaft. Patella was sized, reamed, and drilled  for a 30 mm component.  Trial was put in place.  #11 on the femur, #11 on  the tibia, and a 30 mm on the patella.  With the 10 mm insert, I had  excellent extension and flexion, nicely balanced knee.  Set at 5 degrees of  valgus after appropriate medial capsular release.  Tibial  marked for  appropriate rotation and hand reamed.  All trials removed.  Knee copiously  irrigated with a pulse irrigating device. All recesses examined and all  spurs removed.  Cement prepared and placed on tibial component which was  hammered in place. Excessive cement removed.  Polyethylene attached. Femoral  components seated with posterior cement. Excessive cement removed. Patella  component cemented in place in the recess in the patella.  Once the cement  had hardened, the knee was reexamined. Good patellofemoral tracking, good  stability, good alignment. Full extension and full flexion without liftoff.  The wound was irrigated.  Hemovac placed and brought out through a separate  stab wound. Arthrotomy closed with #1 Vicryl, skin and subcutaneous tissue  with Vicryl and staples.  Margins of the wound and knee injected with  Marcaine and Hemovac clamped.  Sterile compressive dressing applied.  Tourniquet deflated and removed.  Knee immobilizer applied. Anesthesia  reversed. Brought to the recovery  room. Tolerated surgery well with no  complications.                                               Loreta Ave, M.D.    DFM/MEDQ  D:  08/03/2002  T:  08/03/2002  Job:  [id number]

## 2010-11-10 NOTE — Op Note (Signed)
[name], [name]                            ACCOUNT NO.:  [id number]   MEDICAL RECORD NO.:  [id number]                   PATIENT TYPE:  AMB   LOCATION:  NESC                                 FACILITY:  [facility]   PHYSICIAN:  Sigmund I. Patsi Sears, M.D.         DATE OF BIRTH:  1934-01-05   DATE OF PROCEDURE:  02/14/2004  DATE OF DISCHARGE:                                 OPERATIVE REPORT   PREOPERATIVE DIAGNOSES:  1. Painful left varicocele.  2. Multiple left inguinal skin tags.   POSTOPERATIVE DIAGNOSES:  1. Painful left varicocele.  2. Multiple left inguinal skin tags.   OPERATION:  1. Excision of inguinal skin tags.  2. Left internal spermatic vein ligation.   SURGEON:  Sigmund I. Patsi Sears, M.D.   ANESTHESIA:  General (LMA) plus local Marcaine 0.25% plain.   PREPARATION:  After appropriate preanesthesia, the patient was brought to  the operating room and placed on the operating table in the dorsal supine  position where general LMA anesthesia was introduced.  He remained in this  position with the left leg in a frogleg position.  The patient is status  post right total knee arthroplasty and in accordance with national standard,  received IV vancomycin preoperatively.   The patient then underwent cautery excision of the 3 large skin tags in the  left groin.  Following this, an 8 cm incision is made in the left groin,  subcutaneous tissue dissected with the electrosurgical unit.  Large  superficial vein was identified, and this was ligated with 3-0 Vicryl  suture.  Following this, subcutaneous tissue was dissected, and the left  spermatic cord was identified.  A right-angle clamp was placed around the  spermatic cord at the level of the pubic tubercle and a Penrose drain placed  beneath it.  With the spermatic cord isolated, the cord was entered.  The  artery to the testicle was identified by pulsation, and this area of tissue  was dissected and protected.  The vas was also  identified, isolated, and  protected as well.  The remaining spermatic cord was dissected, and large  veins within the spermatic cord were identified, and these were individually  ligated with 3-0 silk suture.  Some vessels were ligated together with other  veins and lymphatics as well.  There was no bleeding noted.  A cord block  was accomplished with 0.25% plain Marcaine  both proximally and distalward.  Irrigation was accomplished, and the wound was closed in two layers with 3-0  Vicryl interrupted suture.  The procedure was accomplished distal to the  external ring.  The skin was closed with skin staples.  The patient was  awakened and taken to the recovery room in good condition.  Sigmund I. Patsi Sears, M.D.   SIT/MEDQ  D:  02/14/2004  T:  02/14/2004  Job:  [id number]

## 2010-11-10 NOTE — Discharge Summary (Signed)
NAMELOT, MEDFORD NO.:  [id number]   MEDICAL RECORD NO.:  [id number]                   PATIENT TYPE:  INP   LOCATION:  5037                                 FACILITY:  [facility]   PHYSICIAN:  Loreta Ave, M.D.              DATE OF BIRTH:  1933-09-30   DATE OF ADMISSION:  08/03/2002  DATE OF DISCHARGE:  08/08/2002                                 DISCHARGE SUMMARY   ADMISSION DIAGNOSIS:  Advanced degenerative joint disease of the right knee.   DISCHARGE DIAGNOSIS:  Advanced degenerative joint disease of the right knee.   PROCEDURE:  Right total knee replacement.   HISTORY:  A 77 year old male with a history of progressive bilateral knee  pain, right greater than left.  He is now having rest pain, nighttime pain,  and pain with activities of daily living.  Increasing varus deformity.  Radiographic end-stage degenerative joint disease was noted.  Now indicated  for right knee replacement.   HOSPITAL COURSE:  A 77 year old male admitted 08/03/02 after appropriate  laboratory studies were obtained, as well as 1 gm of vancomycin IV on call  to the operating room.  He was taken to the operating room where he  underwent a right total knee replacement.  He was placed on a PCA Dilaudid  pump for postoperative pain management.  Vancomycin was continued at 1 gm IV  q.12h. x3 doses.  Heparin was begun at 5000 units subcutaneous q.12h. until  his Coumadin became therapeutic.  Consultation with PT, OT, and rehab were  made.  Ambulating, weightbearing as tolerated on the right.  CPM 0-30  degrees for 8-12 hours per day, increasing 10 degrees a day.  He was allowed  out of bed to a chair the following day.  His IV and Foley were discontinued  on 08/05/02.  He had essentially an unremarkable hospital course.  We did  obtain a clean catch urine for routine C&S on 08/07/02 which did reveal over  70,000 colonies of E. coli.  It was negative for leukocyte esterase  though.  No treatment was entertained.  He was discharged on 08/08/02 to return back  to the office in 10 days for suture removal.  EKG revealed sinus bradycardia  with occasional premature supraventricular complexes.  There are ST-T wave  abnormalities, considering anterior and lateral ischemia which increased  since his last EKG.  Radiographic studies of the right knee revealed  satisfactory right total knee replacement.   LABORATORY DATA:  Admitted with a hemoglobin of 16.7, magnesium 47.6%, white  count 9200, platelet 184,000.  Discharge hemoglobin 12.4, hematocrit 34.8%.  Chemistries preoperative revealed sodium 139, potassium 4.0, chloride 103,  CO2 28, glucose 99, BUN 14, creatinine 1.1, calcium 9.7, total protein 7.0,  albumin 4.2, AST 25, ALT 30, ALP 58, total bilirubin 0.8.  Discharge sodium  136, potassium 3.5, chloride 100, CO2 28, glucose 128, BUN  10, creatinine  1.0, calcium 8.3.  Urinalysis on 07/29/02 was benign for blood in urine.  That  of 08/07/02 was also benign.  Blood type O+.  Antibody screen negative.  Urine culture revealed over 70,000 colonies of E. coli, which was sensitive  to ampicillin, amoxicillin, ceftriaxone, carbenicillin, and cefalexin.   DISCHARGE INSTRUCTIONS:  1. Given a prescription for Percocet _______ 325, 1-2 tabs q.4h. p.r.n.     pain.  2. Coumadin 5 mg as directed by Genevieve Norlander.  3. Colace 100 mg b.i.d.  4. Iron sulfate 325 b.i.d.  5. Activity as taught in physical therapy.  6. No restriction of diet.  7. He is to keep his wound clean and dry, and change the dressing daily.  8. Call if he has a temperature of 101 degrees.  9. Call if he has increasing redness, pain, or swelling.  10.      Follow back up in the office in 10-14 days for a recheck     evaluation.   CONDITION ON DISCHARGE:  Discharged in improved condition.     Oris Drone Petrarca, P.A.-C.                Loreta Ave, M.D.    BDP/MEDQ  D:  09/03/2002  T:  09/04/2002  Job:   [id number]

## 2010-11-21 ENCOUNTER — Telehealth: Payer: Self-pay | Admitting: *Deleted

## 2010-11-21 NOTE — Telephone Encounter (Signed)
Pt states he feel jittery and weakness. He states doesn't have much energy. He gets tired easily. He states this has been going on for about 1 week or so off/on. He states it is worse today. He denies any other symptoms.   Pt will see primary MD for initial evaluation. Pt verbalized understanding.

## 2010-11-22 NOTE — Telephone Encounter (Signed)
Patient & wife walked into office this morning stating that GD told them to come in this morning at 9 for an EKG.  Nurses in office not aware that pt was suppose to be coming in & no provider was in the office during this time.  There is no documentation in the pt's chart showing that Dr. Andee Lineman spoke to pt or requested EKG to be done.  Again, pt advised to see PMD for c/o weakness.  (See below) Advised pt that I would send message to Dr. Andee Lineman.  Patient verbalized understanding.

## 2010-11-22 NOTE — Telephone Encounter (Signed)
Received call from Dr. Andee Lineman stating pt needed EKG this morning.  He apologizes that he was unable to notify us sooner.   Patient notified of this & verbalized understanding.  Will schedule for tomorrow, 5/31 at 9:30.

## 2010-11-23 ENCOUNTER — Encounter: Payer: Self-pay | Admitting: *Deleted

## 2010-11-23 ENCOUNTER — Ambulatory Visit (INDEPENDENT_AMBULATORY_CARE_PROVIDER_SITE_OTHER): Payer: Medicare Other | Admitting: Cardiology

## 2010-11-23 VITALS — BP 122/74 | HR 59 | Ht 71.0 in | Wt 207.0 lb

## 2010-11-23 DIAGNOSIS — I4891 Unspecified atrial fibrillation: Secondary | ICD-10-CM

## 2010-11-23 NOTE — Progress Notes (Signed)
Patient came to office for ekg today since he was informed by MD due to having jittery feelings. MD reviewed ekg and said it was okay. MD said if patient was still having jittery feelings in a week to call office and he could get monitor placed to wear at home. Patient informed and verbalized understanding of plan.

## 2010-12-01 ENCOUNTER — Ambulatory Visit (INDEPENDENT_AMBULATORY_CARE_PROVIDER_SITE_OTHER): Payer: Medicare Other | Admitting: *Deleted

## 2010-12-01 DIAGNOSIS — Z7901 Long term (current) use of anticoagulants: Secondary | ICD-10-CM

## 2010-12-01 DIAGNOSIS — I4892 Unspecified atrial flutter: Secondary | ICD-10-CM

## 2010-12-07 ENCOUNTER — Encounter: Payer: Self-pay | Admitting: Cardiology

## 2010-12-29 ENCOUNTER — Ambulatory Visit (INDEPENDENT_AMBULATORY_CARE_PROVIDER_SITE_OTHER): Payer: Medicare Other | Admitting: *Deleted

## 2010-12-29 DIAGNOSIS — Z7901 Long term (current) use of anticoagulants: Secondary | ICD-10-CM

## 2010-12-29 DIAGNOSIS — I4892 Unspecified atrial flutter: Secondary | ICD-10-CM

## 2011-01-15 ENCOUNTER — Ambulatory Visit (INDEPENDENT_AMBULATORY_CARE_PROVIDER_SITE_OTHER): Payer: Medicare Other | Admitting: Cardiology

## 2011-01-15 ENCOUNTER — Encounter: Payer: Self-pay | Admitting: Cardiology

## 2011-01-15 VITALS — BP 107/49 | HR 52 | Resp 18 | Ht 71.0 in | Wt 207.0 lb

## 2011-01-15 DIAGNOSIS — I2510 Atherosclerotic heart disease of native coronary artery without angina pectoris: Secondary | ICD-10-CM

## 2011-01-15 DIAGNOSIS — Z7901 Long term (current) use of anticoagulants: Secondary | ICD-10-CM

## 2011-01-15 DIAGNOSIS — I4892 Unspecified atrial flutter: Secondary | ICD-10-CM

## 2011-01-15 NOTE — Progress Notes (Signed)
HPI  The patient is a 77 year old male with a history of atrial flutter and tachycardia-induced cardiomyopathy. The patient is status post radiofrequency catheter ablation for atrial flutter. His ejection fraction has normalized. However he has had recurrent atypical atrial flutter and has been placed on amiodarone and required cardioversion. Is also on anticoagulation with Coumadin. He reports no complications. Initially he was maintaining normal sinus rhythm on amiodarone 100 mg by mouth daily but then had treatment failure and had increasing to 200 mg by mouth daily. He presented several weeks ago to the office for a nurse visit because of recurrent palpitations. However an EKG showed normal sinus rhythm and since that time the patient has had no further problems. He denies any chest pain shortness of breath orthopnea PND. The patient state that he is feeling quite well. In April we also checked a TSH which was 3.36. Other laboratory work was essentially within normal limits with mild chronic renal insufficiency and no anemia.  Allergies  Allergen Reactions  . Penicillins   . Sulfonamide Derivatives     Current Outpatient Prescriptions on File Prior to Visit  Medication Sig Dispense Refill  . amiodarone (PACERONE) 200 MG tablet Take 1 tablet (200 mg total) by mouth daily.  30 tablet  6  . calcium-vitamin D (OSCAL WITH D) 500-200 MG-UNIT per tablet Take 1 tablet by mouth daily.        [name] COUMADIN 5 MG tablet USE AS DIRECTED  30 each  3  . Flaxseed, Linseed, (FLAXSEED OIL) 1000 MG CAPS Take 1 capsule by mouth daily.        [name] latanoprost (XALATAN) 0.005 % ophthalmic solution Place 1 drop into both eyes at bedtime.      . Multiple Vitamins-Minerals (MEGA MULTI MEN) TBCR Take 1 tablet by mouth daily.        . Omega-3 Fatty Acids (FISH OIL) 1000 MG CAPS Take 1 capsule by mouth daily.        [name] Specialty Vitamins Products (ICAPS LUTEIN-ZEAXANTHIN PO) Take 1 tablet by mouth daily.        [name] Specialty  Vitamins Products (UDAMIN SP) TABS Take 1 tablet by mouth Twice daily.      . Tamsulosin HCl (FLOMAX) 0.4 MG CAPS Take 0.4 capsules by mouth Daily.        Past Medical History  Diagnosis Date  . Unspecified cardiovascular disease     L ventricular function deceased  . S/P ablation of atrial flutter     atrial? atypical flutter back in normal sinus rhythm. s/p radio catheter frequency ablation . on amiodarone. s/p TEE with left atrial appendage clots, on COumadin  . Left ventricular dysfunction     improved  to 60-665%    Past Surgical History  Procedure Date  . Femoral hernia repair     x2  . Turp vaporization   . Knee athroplasty total     No family history on file.  History   Social History  . Marital Status: Married    Spouse Name: N/A    Number of Children: N/A  . Years of Education: N/A   Occupational History  . Not on file.   Social History Main Topics  . Smoking status: Unknown If Ever Smoked  . Smokeless tobacco: Never Used   Comment: tobacco use- no  . Alcohol Use: No  . Drug Use: No  . Sexually Active: Not on file   Other Topics Concern  . Not on file   Social  History Narrative   Retired, married.    General: Well-developed, well-nourished in no distress Head: Normocephalic and atraumatic Eyes:PERRLA/EOMI intact, conjunctiva and lids normal Ears: No deformity or lesions Mouth:normal dentition, normal posterior pharynx Neck: Supple, no JVD.  No masses, thyromegaly or abnormal cervical nodes Lungs: Normal breath sounds bilaterally without wheezing.  Normal percussion Cardiac: regular rate and rhythm with normal S1 and S2, no S3 or S4.  PMI is normal.  No pathological murmurs Abdomen: Normal bowel sounds, abdomen is soft and nontender without masses, organomegaly or hernias noted.  No hepatosplenomegaly MSK: Back normal, normal gait muscle strength and tone normal Vascular: Pulse is normal in all 4 extremities Extremities: No peripheral pitting  edema Neurologic: Alert and oriented x 3 Skin: Intact without lesions or rashes Lymphatics: No significant adenopathy Psychologic: Normal affect  PHYSICAL EXAM BP 107/49  Pulse 52  Resp 18  Ht 5\' 11"  (1.803 m)  Wt 207 lb (93.895 kg)  BMI 28.87 kg/m2  SpO2 97%  General: Well-developed, well-nourished in no distress Head: Normocephalic and atraumatic Eyes:PERRLA/EOMI intact, conjunctiva and lids normal Ears: No deformity or lesions Mouth:normal dentition, normal posterior pharynx Neck: Supple, no JVD.  No masses, thyromegaly or abnormal cervical nodes Lungs: Normal breath sounds bilaterally without wheezing.  Normal percussion Cardiac: regular rate and rhythm with normal S1 and S2, no S3 or S4.  PMI is normal.  No pathological murmurs Abdomen: Normal bowel sounds, abdomen is soft and nontender without masses, organomegaly or hernias noted.  No hepatosplenomegaly MSK: Back normal, normal gait muscle strength and tone normal Vascular: Pulse is normal in all 4 extremities Extremities: No peripheral pitting edema Neurologic: Alert and oriented x 3 Skin: Intact without lesions or rashes Lymphatics: No significant adenopathy Psychologic: Normal affect  ECG: Not available  ASSESSMENT AND PLAN

## 2011-01-15 NOTE — Patient Instructions (Signed)
Continue all current medications. Your physician wants you to follow up in: 6 months.  You will receive a reminder letter in the mail one-two months in advance.  If you don't receive a letter, please call our office to schedule the follow up appointment   

## 2011-01-15 NOTE — Assessment & Plan Note (Signed)
The patient remained in normal sinus rhythm. He is now on a slightly higher dose of amiodarone at 200 minutes by mouth daily. TSH performed 2 months ago was within normal limits. Also liver function tests were within normal limits

## 2011-01-15 NOTE — Assessment & Plan Note (Signed)
Status post prior left atrial thrombus. Patient will continue on Coumadin. He reports no complications.

## 2011-01-15 NOTE — Assessment & Plan Note (Signed)
Normalization of left ventricular function after restoration of normal sinus rhythm. Status post tachycardia induced cardiomyopathy

## 2011-01-26 ENCOUNTER — Ambulatory Visit (INDEPENDENT_AMBULATORY_CARE_PROVIDER_SITE_OTHER): Payer: Medicare Other | Admitting: *Deleted

## 2011-01-26 DIAGNOSIS — Z7901 Long term (current) use of anticoagulants: Secondary | ICD-10-CM

## 2011-01-26 DIAGNOSIS — I4892 Unspecified atrial flutter: Secondary | ICD-10-CM

## 2011-02-23 ENCOUNTER — Ambulatory Visit (INDEPENDENT_AMBULATORY_CARE_PROVIDER_SITE_OTHER): Payer: Medicare Other | Admitting: *Deleted

## 2011-02-23 DIAGNOSIS — Z7901 Long term (current) use of anticoagulants: Secondary | ICD-10-CM

## 2011-02-23 DIAGNOSIS — I4892 Unspecified atrial flutter: Secondary | ICD-10-CM

## 2011-02-23 LAB — POCT INR: INR: 2.4

## 2011-03-23 ENCOUNTER — Encounter: Payer: Medicare Other | Admitting: *Deleted

## 2011-03-27 ENCOUNTER — Ambulatory Visit (INDEPENDENT_AMBULATORY_CARE_PROVIDER_SITE_OTHER): Payer: Medicare Other | Admitting: *Deleted

## 2011-03-27 DIAGNOSIS — I4892 Unspecified atrial flutter: Secondary | ICD-10-CM

## 2011-03-27 DIAGNOSIS — Z7901 Long term (current) use of anticoagulants: Secondary | ICD-10-CM

## 2011-04-02 ENCOUNTER — Other Ambulatory Visit: Payer: Self-pay | Admitting: Cardiology

## 2011-04-11 ENCOUNTER — Telehealth: Payer: Self-pay | Admitting: *Deleted

## 2011-04-11 NOTE — Telephone Encounter (Signed)
Wife dropped off paper on a vitamin that Dr. Elmer Picker wanted patient to take for macular degeneration.  Eye vitamin contains Vitamin C & E, Zinc, Copper, Lutein, Bilberry, and Zeaxanthin.  Patient notified via answering machine that Dr. Andee Lineman said okay to take vitamin.    Also, sent Misty Stanley message regarding above since she manages his coumadin.

## 2011-04-12 ENCOUNTER — Telehealth: Payer: Self-pay | Admitting: *Deleted

## 2011-04-12 NOTE — Telephone Encounter (Signed)
Message copied by Murriel Hopper on Thu Apr 12, 2011 11:50 AM ------      Message from: Kyung Rudd      Created: Wed Apr 11, 2011  4:17 PM       It is OK for him to take. Thxs      ----- Message -----         From: Hoover Brunette, LPN         Sent: 04/11/2011   1:14 PM           To: Louanna Raw, RN            Patient having cataract surgery in 2 weeks & Dr. Elmer Picker suggest he take eye vitamin.  Wanted to make sure okay since he was on coumadin.              Did run by GD & he thought would be okay.  Just making sure nothing new that you are aware of            Eye vitamin contains:      Vitamin C      Vitamin E      Zinc      Copper      Lutein      Bilberry      Zeaxanthin            Thanks      gayle

## 2011-04-24 ENCOUNTER — Ambulatory Visit (INDEPENDENT_AMBULATORY_CARE_PROVIDER_SITE_OTHER): Payer: Medicare Other | Admitting: *Deleted

## 2011-04-24 DIAGNOSIS — I4892 Unspecified atrial flutter: Secondary | ICD-10-CM

## 2011-04-24 DIAGNOSIS — Z7901 Long term (current) use of anticoagulants: Secondary | ICD-10-CM

## 2011-04-24 LAB — POCT INR: INR: 2.1

## 2011-05-09 ENCOUNTER — Other Ambulatory Visit: Payer: Self-pay | Admitting: *Deleted

## 2011-05-09 DIAGNOSIS — I4892 Unspecified atrial flutter: Secondary | ICD-10-CM

## 2011-05-09 MED ORDER — AMIODARONE HCL 200 MG PO TABS: 200.0000 mg | ORAL_TABLET | Freq: Every day | ORAL | Status: DC

## 2011-06-05 ENCOUNTER — Ambulatory Visit (INDEPENDENT_AMBULATORY_CARE_PROVIDER_SITE_OTHER): Payer: Medicare Other | Admitting: *Deleted

## 2011-06-05 DIAGNOSIS — Z7901 Long term (current) use of anticoagulants: Secondary | ICD-10-CM

## 2011-06-05 DIAGNOSIS — I4892 Unspecified atrial flutter: Secondary | ICD-10-CM

## 2011-06-05 LAB — POCT INR: INR: 3.3

## 2011-07-10 ENCOUNTER — Ambulatory Visit (INDEPENDENT_AMBULATORY_CARE_PROVIDER_SITE_OTHER): Payer: Medicare Other | Admitting: *Deleted

## 2011-07-10 DIAGNOSIS — Z7901 Long term (current) use of anticoagulants: Secondary | ICD-10-CM

## 2011-07-10 DIAGNOSIS — I4892 Unspecified atrial flutter: Secondary | ICD-10-CM

## 2011-07-10 LAB — POCT INR: INR: 4.8

## 2011-07-20 ENCOUNTER — Encounter: Payer: Self-pay | Admitting: Cardiology

## 2011-07-20 ENCOUNTER — Ambulatory Visit (INDEPENDENT_AMBULATORY_CARE_PROVIDER_SITE_OTHER): Payer: Medicare Other | Admitting: Cardiology

## 2011-07-20 ENCOUNTER — Ambulatory Visit (INDEPENDENT_AMBULATORY_CARE_PROVIDER_SITE_OTHER): Payer: Medicare Other | Admitting: *Deleted

## 2011-07-20 DIAGNOSIS — I4892 Unspecified atrial flutter: Secondary | ICD-10-CM

## 2011-07-20 DIAGNOSIS — Z79899 Other long term (current) drug therapy: Secondary | ICD-10-CM

## 2011-07-20 DIAGNOSIS — Z9889 Other specified postprocedural states: Secondary | ICD-10-CM

## 2011-07-20 DIAGNOSIS — Z8679 Personal history of other diseases of the circulatory system: Secondary | ICD-10-CM

## 2011-07-20 DIAGNOSIS — I4891 Unspecified atrial fibrillation: Secondary | ICD-10-CM

## 2011-07-20 DIAGNOSIS — I519 Heart disease, unspecified: Secondary | ICD-10-CM

## 2011-07-20 DIAGNOSIS — Z7901 Long term (current) use of anticoagulants: Secondary | ICD-10-CM

## 2011-07-20 NOTE — Patient Instructions (Signed)
Your physician wants you to follow-up in: 6 months. You will receive a reminder letter in the mail one-two months in advance. If you don't receive a letter, please call our office to schedule the follow-up appointment. Your physician recommends that you continue on your current medications as directed. Please refer to the Current Medication list given to you today. Your physician recommends that you go to the [facility] for lab work: LFT/TSH. If the results of your test are normal or stable, you will receive a letter. If they are abnormal, the nurse will contact you by phone.

## 2011-07-21 ENCOUNTER — Encounter: Payer: Self-pay | Admitting: Cardiology

## 2011-07-21 DIAGNOSIS — I4892 Unspecified atrial flutter: Secondary | ICD-10-CM | POA: Insufficient documentation

## 2011-07-21 DIAGNOSIS — I429 Cardiomyopathy, unspecified: Secondary | ICD-10-CM | POA: Insufficient documentation

## 2011-07-21 NOTE — Progress Notes (Signed)
Larry Bottoms, MD, [facility] ABIM Board Certified in Adult Cardiovascular Medicine,Internal Medicine and Critical Care Medicine    CC: followup patient with history of atrial flutter and tachycardia-induced cardio myopathy  HPI:  The patient is status post radiofrequency catheter ablation.  However, he had recurrence of atypical flutter and required cardioversion and amiodarone therapy.  He is on low dose amiodarone therapy, although he had recurrence of atrial flutter at a dose of 100mg  a day.he is currently taking 200 mg a day and has been able to maintain normal sinus rhythm. He denies any chest pain, shortness of breath, orthopnea, PND.  He reports no recurrent palpitations and is actually doing quite well from a cardiac standpoint. He does report to me that he has frequent bloating after meals.  He feels also nondistended.  He feels his abdomen is tight.  He also has constipation and uses MiraLax on a regular basis.  He states that he tries to eat healthy that he eats a lot of candy, canned fruit and vegetables as well as apples, all which can cause significant gas production.  The patient's symptoms appear to be consistent with IBS.  Apparently he had a prior colonoscopy.  PMH: reviewed and listed in Problem List in Electronic Records (and see below) Past Medical History  Diagnosis Date  . S/P ablation of atrial flutter     status post tachycardia-induced cardiomyopathy  . Atrial flutter, paroxysmal     recurrence of atrial flutter after ablation/atypical, requiring cardioversion and amiodarone therapy.  . Left ventricular dysfunction     Improved after cardioversion and restoration of normal sinus rhythm.  Ejection fraction 60-65%   Past Surgical History  Procedure Date  . Femoral hernia repair     x2  . Turp vaporization   . Knee athroplasty total     Allergies/SH/FHX : available in Electronic Records for review  Allergies  Allergen Reactions  . Penicillins   . Sulfonamide  Derivatives    History   Social History  . Marital Status: Married    Spouse Name: N/A    Number of Children: N/A  . Years of Education: N/A   Occupational History  . Not on file.   Social History Main Topics  . Smoking status: Never Smoker   . Smokeless tobacco: Never Used   Comment: tobacco use- no  . Alcohol Use: No  . Drug Use: No  . Sexually Active: Not on file   Other Topics Concern  . Not on file   Social History Narrative   Retired, married.    No family history on file.  Medications: Current Outpatient Prescriptions  Medication Sig Dispense Refill  . amiodarone (PACERONE) 200 MG tablet Take 1 tablet (200 mg total) by mouth daily.  30 tablet  6  . calcium-vitamin D (OSCAL WITH D) 500-200 MG-UNIT per tablet Take 1 tablet by mouth daily.        . Chromium Picolinate 200 MCG CAPS Take 200 mcg by mouth daily.        [name] COUMADIN 5 MG tablet USE AS DIRECTED  45 each  2  . Flaxseed, Linseed, (FLAXSEED OIL) 1000 MG CAPS Take 1 capsule by mouth daily.        [name] latanoprost (XALATAN) 0.005 % ophthalmic solution Place 1 drop into both eyes at bedtime.      . Multiple Vitamins-Minerals (MEGA MULTI MEN) TBCR Take 1 tablet by mouth daily.        . Omega-3 Fatty Acids (  FISH OIL) 1000 MG CAPS Take 1 capsule by mouth daily.        [name] PROBIOTIC CAPS Take 1 capsule by mouth daily.      [name] Specialty Vitamins Products (ICAPS LUTEIN-ZEAXANTHIN PO) Take 1 tablet by mouth daily.        [name] Specialty Vitamins Products (UDAMIN SP) TABS Take 1 tablet by mouth Twice daily.      . Tamsulosin HCl (FLOMAX) 0.4 MG CAPS Take 0.4 capsules by mouth Daily.        ROS: No nausea or vomiting. No fever or chills.No melena or hematochezia.No bleeding.No claudication  Physical Exam: BP 109/66  Pulse 60  Ht 5\' 11"  (1.803 m)  Wt 210 lb 1.9 oz (95.31 kg)  BMI 29.31 kg/m2 General:well-nourished white male in no distress. Neck:normal carotid upstroke.  No carotid bruits.  No thyromegaly nonnodular  thyroid.  JVP is 5 cm Lungs:clear breath sounds bilaterally without wheezing Cardiac:regular rate and rhythm with normal S1, S2.  No pathological murmurs. Vascular:no edema.  Normal distal pulses bilaterally Skin:warm and dry Physcologic:normal affect.  12lead ZOX:WRUEAV leadElectrocardiogram demonstrates normal sinus rhythm Limited bedside ECHO:N/A   Patient Active Problem List  Diagnoses  . ATRIAL FLUTTER, PAROXYSMAL-no recurrence  . DYSPNEA  . ABDOMINAL BLOATINGnext symptoms suggestive of IBS  . Encounter for long-term (current) use of anticoagulantsno complications  . Left ventricular dysfunction-status post tachycardia induced myopathy, ejection fraction normalized  . S/P ablation of atrial flutter-recurrence of atypical flutter requiring amiodarone    PLAN   From a cardiac standpoint, the patient doing quite well.  He is tolerating amiodarone 200 mg by mouth daily.  I do not think any recent GI symptoms.  He reports aren't related to amiodarone.  TSH has been followed several months ago and was within normal limits.  The patient is due for followup laboratory work.  I also gave the patient information regarding the FODMAP diet in order to improve her symptoms of abdominal bloating.  I also recommended the use lactose free products including Yogurt with probiotics  Additionally, the patient should use MiraLax on a regular basis.  No indication for echocardiogram at the present time.

## 2011-08-17 ENCOUNTER — Ambulatory Visit (INDEPENDENT_AMBULATORY_CARE_PROVIDER_SITE_OTHER): Payer: Medicare Other | Admitting: *Deleted

## 2011-08-17 DIAGNOSIS — Z7901 Long term (current) use of anticoagulants: Secondary | ICD-10-CM

## 2011-08-17 DIAGNOSIS — I4892 Unspecified atrial flutter: Secondary | ICD-10-CM

## 2011-09-13 ENCOUNTER — Other Ambulatory Visit: Payer: Self-pay | Admitting: Cardiology

## 2011-09-18 ENCOUNTER — Ambulatory Visit (INDEPENDENT_AMBULATORY_CARE_PROVIDER_SITE_OTHER): Payer: Medicare Other | Admitting: *Deleted

## 2011-09-18 DIAGNOSIS — I4892 Unspecified atrial flutter: Secondary | ICD-10-CM

## 2011-09-18 DIAGNOSIS — Z7901 Long term (current) use of anticoagulants: Secondary | ICD-10-CM

## 2011-09-18 LAB — POCT INR: INR: 2.1

## 2011-10-23 ENCOUNTER — Ambulatory Visit (INDEPENDENT_AMBULATORY_CARE_PROVIDER_SITE_OTHER): Payer: Medicare Other | Admitting: *Deleted

## 2011-10-23 DIAGNOSIS — Z7901 Long term (current) use of anticoagulants: Secondary | ICD-10-CM

## 2011-10-23 DIAGNOSIS — I4892 Unspecified atrial flutter: Secondary | ICD-10-CM

## 2011-11-20 ENCOUNTER — Ambulatory Visit (INDEPENDENT_AMBULATORY_CARE_PROVIDER_SITE_OTHER): Payer: Medicare Other | Admitting: *Deleted

## 2011-11-20 DIAGNOSIS — Z7901 Long term (current) use of anticoagulants: Secondary | ICD-10-CM

## 2011-11-20 DIAGNOSIS — I4892 Unspecified atrial flutter: Secondary | ICD-10-CM

## 2011-11-20 LAB — POCT INR: INR: 4.4

## 2011-12-04 ENCOUNTER — Ambulatory Visit (INDEPENDENT_AMBULATORY_CARE_PROVIDER_SITE_OTHER): Payer: Medicare Other | Admitting: *Deleted

## 2011-12-04 DIAGNOSIS — Z7901 Long term (current) use of anticoagulants: Secondary | ICD-10-CM

## 2011-12-04 DIAGNOSIS — I4892 Unspecified atrial flutter: Secondary | ICD-10-CM

## 2011-12-08 ENCOUNTER — Other Ambulatory Visit: Payer: Self-pay | Admitting: Cardiology

## 2011-12-18 ENCOUNTER — Ambulatory Visit (INDEPENDENT_AMBULATORY_CARE_PROVIDER_SITE_OTHER): Payer: Medicare Other | Admitting: *Deleted

## 2011-12-18 DIAGNOSIS — Z7901 Long term (current) use of anticoagulants: Secondary | ICD-10-CM

## 2011-12-18 DIAGNOSIS — I4892 Unspecified atrial flutter: Secondary | ICD-10-CM

## 2012-01-15 ENCOUNTER — Ambulatory Visit (INDEPENDENT_AMBULATORY_CARE_PROVIDER_SITE_OTHER): Payer: Medicare Other | Admitting: *Deleted

## 2012-01-15 DIAGNOSIS — Z7901 Long term (current) use of anticoagulants: Secondary | ICD-10-CM

## 2012-01-15 DIAGNOSIS — I4892 Unspecified atrial flutter: Secondary | ICD-10-CM

## 2012-01-16 ENCOUNTER — Encounter: Payer: Self-pay | Admitting: Adult Health

## 2012-01-16 ENCOUNTER — Ambulatory Visit (INDEPENDENT_AMBULATORY_CARE_PROVIDER_SITE_OTHER): Payer: Medicare Other | Admitting: Adult Health

## 2012-01-16 ENCOUNTER — Ambulatory Visit: Payer: Medicare Other | Admitting: Cardiology

## 2012-01-16 VITALS — BP 102/52 | HR 62 | Ht 71.0 in | Wt 208.1 lb

## 2012-01-16 DIAGNOSIS — I4892 Unspecified atrial flutter: Secondary | ICD-10-CM

## 2012-01-16 DIAGNOSIS — I4891 Unspecified atrial fibrillation: Secondary | ICD-10-CM

## 2012-01-16 DIAGNOSIS — R141 Gas pain: Secondary | ICD-10-CM

## 2012-01-16 DIAGNOSIS — R143 Flatulence: Secondary | ICD-10-CM

## 2012-01-16 DIAGNOSIS — IMO0002 Reserved for concepts with insufficient information to code with codable children: Secondary | ICD-10-CM

## 2012-01-16 NOTE — Patient Instructions (Addendum)
Your physician recommends that you continue on your current medications as directed. Please refer to the Current Medication list given to you today  Your physician wants you to follow-up in: 6 months with Dr. Andee Lineman. You will receive a reminder letter in the mail two months in advance. If you don't receive a letter, please call our office to schedule the follow-up appointment.

## 2012-01-16 NOTE — Assessment & Plan Note (Signed)
He is advised to see GI specialist if symptoms worsen or become more worrisome to him.

## 2012-01-16 NOTE — Assessment & Plan Note (Signed)
He is without complaint of recurrent atrial fib/flutter, palpitations.  He continues on amiodarone without symptoms. He is complaining of mild DOE, but this has been an ongoing problem. He is advised to lose weight and exercise by walking. He is going to the beach with his wife this week. I have advised that he walk on the beach or swim to increase his exercise capacity. He will follow with Dr. Andee Lineman in 6 months.

## 2012-01-16 NOTE — Progress Notes (Signed)
HPI: [name] is a stoic 78 y/o patient of Dr.DeGent, normally seen in the [facility] office that he follows for Atrial flutter, s/p RFA and amiodarone therapy. He is here for 6  Months follow up. He is without complaint with the exception of chronic fatigue and mild DOE at times.He denies wheezes or coughing. He has a history of IBS but is not followed regularly by a gastroenterologist.   Allergies  Allergen Reactions  . Penicillins   . Sulfonamide Derivatives     Current Outpatient Prescriptions  Medication Sig Dispense Refill  . amiodarone (PACERONE) 200 MG tablet TAKE ONE BY MOUTH ONCE DAILY  30 tablet  6  . calcium-vitamin D (OSCAL WITH D) 500-200 MG-UNIT per tablet Take 1 tablet by mouth daily.        . Chromium Picolinate 200 MCG CAPS Take 200 mcg by mouth daily.        [name] COUMADIN 5 MG tablet TAKE AS DIRECTED  45 each  2  . Flaxseed, Linseed, (FLAXSEED OIL) 1000 MG CAPS Take 1 capsule by mouth daily.        [name] latanoprost (XALATAN) 0.005 % ophthalmic solution Place 1 drop into both eyes at bedtime.      . Multiple Vitamins-Minerals (MEGA MULTI MEN) TBCR Take 1 tablet by mouth daily.        . Omega-3 Fatty Acids (FISH OIL) 1000 MG CAPS Take 1 capsule by mouth daily.        [name] PROBIOTIC CAPS Take 1 capsule by mouth daily.      [name] Specialty Vitamins Products (ICAPS LUTEIN-ZEAXANTHIN PO) Take 1 tablet by mouth daily.        [name] Specialty Vitamins Products (UDAMIN SP) TABS Take 1 tablet by mouth Twice daily.      . Tamsulosin HCl (FLOMAX) 0.4 MG CAPS Take 0.4 capsules by mouth Daily.        Past Medical History  Diagnosis Date  . S/P ablation of atrial flutter     status post tachycardia-induced cardiomyopathy  . Atrial flutter, paroxysmal     recurrence of atrial flutter after ablation/atypical, requiring cardioversion and amiodarone therapy.  . Left ventricular dysfunction     Improved after cardioversion and restoration of normal sinus rhythm.  Ejection fraction 60-65%    Past  Surgical History  Procedure Date  . Femoral hernia repair     x2  . Turp vaporization   . Knee athroplasty total     NWG:NFAOZH of systems complete and found to be negative unless listed above  PHYSICAL EXAM BP 102/52  Pulse 62  Ht 5\' 11"  (1.803 m)  Wt 208 lb 1.9 oz (94.403 kg)  BMI 29.03 kg/m2  General: Well developed, well nourished, in no acute distress Head: Eyes PERRLA, No xanthomas.   Normal cephalic and atramatic  Lungs: Clear bilaterally to auscultation and percussion. Heart: HRRR S1 S2, without MRG.  Pulses are 2+ & equal.            No carotid bruit. No JVD.  No abdominal bruits. No femoral bruits. Abdomen: Bowel sounds are positive, abdomen soft and non-tender without masses or                  Hernia's noted. Msk:  Back normal, normal gait. Normal strength and tone for age. Extremities: No clubbing, cyanosis or edema.  DP +1 Neuro: Alert and oriented X 3. Psych:  Flat  affect, responds appropriately  EKG:SR with 1st degree AV block. PR .  218. Right ventricular conduction delay. Left posterior fascicular block.  Rate of 62 bpm.  ASSESSMENT AND PLAN

## 2012-02-02 ENCOUNTER — Other Ambulatory Visit: Payer: Self-pay | Admitting: Cardiology

## 2012-02-12 ENCOUNTER — Ambulatory Visit (INDEPENDENT_AMBULATORY_CARE_PROVIDER_SITE_OTHER): Payer: Medicare Other | Admitting: *Deleted

## 2012-02-12 DIAGNOSIS — Z7901 Long term (current) use of anticoagulants: Secondary | ICD-10-CM

## 2012-02-12 DIAGNOSIS — I4892 Unspecified atrial flutter: Secondary | ICD-10-CM

## 2012-02-12 LAB — POCT INR: INR: 2.7

## 2012-03-11 ENCOUNTER — Ambulatory Visit (INDEPENDENT_AMBULATORY_CARE_PROVIDER_SITE_OTHER): Payer: Medicare Other | Admitting: *Deleted

## 2012-03-11 DIAGNOSIS — Z7901 Long term (current) use of anticoagulants: Secondary | ICD-10-CM

## 2012-03-11 DIAGNOSIS — I4892 Unspecified atrial flutter: Secondary | ICD-10-CM

## 2012-03-11 LAB — POCT INR: INR: 2.4

## 2012-04-11 ENCOUNTER — Telehealth: Payer: Self-pay | Admitting: Cardiology

## 2012-04-11 ENCOUNTER — Ambulatory Visit (INDEPENDENT_AMBULATORY_CARE_PROVIDER_SITE_OTHER): Payer: Medicare Other | Admitting: Physician Assistant

## 2012-04-11 ENCOUNTER — Encounter: Payer: Self-pay | Admitting: Physician Assistant

## 2012-04-11 VITALS — BP 118/70 | HR 53 | Ht 71.0 in | Wt 209.8 lb

## 2012-04-11 DIAGNOSIS — R5383 Other fatigue: Secondary | ICD-10-CM

## 2012-04-11 DIAGNOSIS — Z7901 Long term (current) use of anticoagulants: Secondary | ICD-10-CM

## 2012-04-11 DIAGNOSIS — R0602 Shortness of breath: Secondary | ICD-10-CM

## 2012-04-11 DIAGNOSIS — Z8679 Personal history of other diseases of the circulatory system: Secondary | ICD-10-CM

## 2012-04-11 DIAGNOSIS — R5381 Other malaise: Secondary | ICD-10-CM

## 2012-04-11 DIAGNOSIS — I4892 Unspecified atrial flutter: Secondary | ICD-10-CM

## 2012-04-11 NOTE — Telephone Encounter (Signed)
Spoke with patient and he said he is having sob, and weak feeling. No c/o dizziness or chest pain. Nurse advise patient to come in office today to see PA at 1:30pm.

## 2012-04-11 NOTE — Patient Instructions (Addendum)
Your physician recommends that you schedule a follow-up appointment in: 3 weeks with Dr. Myrtis Ser. Your physician recommends that you continue on your current medications as directed. Please refer to the Current Medication list given to you today.  A chest x-ray takes a picture of the organs and structures inside the chest, including the heart, lungs, and blood vessels. This test can show several things, including, whether the heart is enlarges; whether fluid is building up in the lungs; and whether pacemaker / defibrillator leads are still in place. Have this done today.  Your physician recommends that you return for lab work in: today at [facility]. Your physician has requested that you have an echocardiogram. Echocardiography is a painless test that uses sound waves to create images of your heart. It provides your doctor with information about the size and shape of your heart and how well your heart's chambers and valves are working. This procedure takes approximately one hour. There are no restrictions for this procedure.

## 2012-04-11 NOTE — Telephone Encounter (Signed)
Patient's wife called stated that he has been weak since yesterday. No other symptoms. Asked if she has called PCP and she said she didn't want to take him to PCP.

## 2012-04-11 NOTE — Progress Notes (Signed)
Cardiology Clinic Note  Name:  [name]   DOB:  1934/06/21   MRN:  [id number] Date of Encounter: 04/11/2012, 1:52 PM Primary Care Provider:  Ignatius Specking., MD Primary Cardiologist:  Previously Dr. Andee Lineman / Dr. Graciela Husbands performed ablation  HPI: 78 y/o M with history of atrial flutter s/p RFCA with recurrence, treated with DCCV and amiodarone therapy (had recurrence on 100mg  daily so is taking 200mg  daily). He has had CHF in the past felt due to tachycardia-mediated cardiomyopathy, with improvement in his LV function in 2011 after maintenance of NSR. Last TSH 06/2011 - WNL. INR 2.4 on 03/11/12. Echo 10/2009: Normal LV function EF 70%. Mild LVH. Mod LAE. Mild LVH. RVSP . Small inferior periocardial effusion. Mild TR. He presents as an add-on today for SOB and weakness. PFTs 01/2010: small airway obstruction defect.  He presents as an add-on today for SOB and weakness. For the past several weeks, he has noticed that when he lays down to sleep at night that his breathing is more "stuffy" and labored. He only sleeps on 1 pillow because of neck discomfort if he uses more. This also happens in the daytime if he lays down. He feels better when he sits up. Has mild cough whenever he has sinus drainage. No PND, palpitations, or dizziness. He was able to detect his arrhythmia in the past and does not think he's had any recurrence. He does not get any CP or SOB when he exerts himself. However, he feels more "weak" than he used to whenever he exerts himself (i.e. he describes his arm muscles getting tired easier). No recent weight change, LEE. He is not tachycardic, tachypnic or hypoxic.  Problem List Past Medical History  Diagnosis Date  . Atrial flutter, paroxysmal     a. Hx of TEE with LAA clots. b. s/p ESP/RFCA of atrial flutter 08/2008. b. Recurrence of A-flutter after ablation/atypical, requiring DCCV and amiodaorone.  . Left ventricular dysfunction     a. Prior EF 30-35%, felt tachycardia induced  cardiomyopathyy. b. Improved after cardioversion and restoration of normal sinus rhythm - EF 70% by echo 2011.  [name] HTN (hypertension)   . IBS (irritable bowel syndrome)    Past Surgical History  Procedure Date  . Femoral hernia repair     x2  . Turp vaporization   . Total knee arthroplasty   . Umbilical hernia repair     Most Recent Cardiac Studies: See above re: echo   Allergies Allergies  Allergen Reactions  . Penicillins   . Sulfonamide Derivatives     Home Medications Current Outpatient Prescriptions  Medication Sig Dispense Refill  . amiodarone (PACERONE) 200 MG tablet TAKE ONE BY MOUTH ONCE DAILY  30 tablet  6  . calcium-vitamin D (OSCAL WITH D) 500-200 MG-UNIT per tablet Take 1 tablet by mouth daily.        . Chromium Picolinate 200 MCG CAPS Take 200 mcg by mouth daily.        . Flaxseed, Linseed, (FLAXSEED OIL) 1000 MG CAPS Take 1 capsule by mouth daily.        [name] latanoprost (XALATAN) 0.005 % ophthalmic solution Place 1 drop into both eyes at bedtime.      . Multiple Vitamins-Minerals (MEGA MULTI MEN) TBCR Take 1 tablet by mouth daily.        . Omega-3 Fatty Acids (FISH OIL) 1000 MG CAPS Take 1 capsule by mouth daily.        [name] PROBIOTIC CAPS Take 1 capsule  by mouth daily.      [name] Specialty Vitamins Products (ICAPS LUTEIN-ZEAXANTHIN PO) Take 1 tablet by mouth daily.        [name] Specialty Vitamins Products (UDAMIN SP) TABS Take 1 tablet by mouth Twice daily.      . Tamsulosin HCl (FLOMAX) 0.4 MG CAPS Take 0.4 capsules by mouth Daily.      [name] warfarin (COUMADIN) 5 MG tablet       . DISCONTD: COUMADIN 5 MG tablet TAKE AS DIRECTED  45 each  3    History   Social History  . Marital Status: Married    Spouse Name: N/A    Number of Children: N/A  . Years of Education: N/A   Occupational History  . Not on file.   Social History Main Topics  . Smoking status: Never Smoker   . Smokeless tobacco: Never Used   Comment: tobacco use- no  . Alcohol Use: No  . Drug Use: No    . Sexually Active: Not on file   Other Topics Concern  . Not on file   Social History Narrative   Retired, married.      Fam hx: noncontributory for premature CAD  Review of Systems:  General: negative for chills, fever, night sweats or weight changes Cardiovascular: see above Dermatological: negative for new rash Respiratory: negative for wheezing. Occasional unchanged nonproductive sinus drainage cough Urologic: negative for hematuria Abdominal: negative for nausea, vomiting, diarrhea, bright red blood per rectum, melena, or hematemesis Neurologic: negative for visual changes, syncope, or dizziness All other systems reviewed and are otherwise negative except as noted above.  Physical Exam: Blood pressure 118/70, pulse 53, height 5\' 11"  (1.803 m), weight 209 lb 12.8 oz (95.165 kg), SpO2 98.00%. Wt Readings from Last 3 Encounters:  04/11/12 209 lb 12.8 oz (95.165 kg)  01/16/12 208 lb 1.9 oz (94.403 kg)  07/20/11 210 lb 1.9 oz (95.31 kg)    General: Well developed, well nourished elderly WM in no acute distress. Head: Normocephalic, atraumatic, sclera non-icteric, no xanthomas, nares are without discharge.  Neck: Negative for carotid bruits. JVD not elevated. Lungs: Decreased BS at bases, otherwise clear without wheezes, rales, or rhonchi. Breathing is unlabored. Heart: RRR with S1 S2. No murmurs, rubs, or gallops appreciated. Abdomen: Soft, non-tender, non-distended with normoactive bowel sounds. No hepatomegaly. No rebound/guarding. No obvious abdominal masses. Msk:  Strength and tone appear normal for age. Extremities: No clubbing or cyanosis. No edema.  Distal pedal pulses are 2+ and equal bilaterally. Neuro: Alert and oriented X 3. No facial asymmetry. No focal deficit. Moves all extremities spontaneously. Psych:  Responds to questions appropriately with a normal affect.   Accessory Clinical Findings:  EKG - sinus bradycardia 53bpm 1st degree AVB, incomplete RBBB (QRS  96), possible prior inferior infarct. ?No acute ST-T changes. Aside from the fact that leads III and avF have upright T waves as compared to prior in 12/2011, there are no changes to prior tracing.  Assessment & Plan: 1. Dyspnea, fatigue- unclear etiology. Not clearly volume overloaded on exam but does have some decreased BS at bases. Will order CXR & 2D echo to further assess. No exertional symptoms or palpitations. Check TSH, CBC, CMET. Further intervention based on these findings. May need PFTs given amiodarone if workup unrevealing.  2. H/o atrial flutter s/p RFCA then DCCV & chronic amiodarone therapy - maintaining NSR so doubt involvement in his symptoms. Continue Coumadin. Continue amiodarone for now but see above. Update CMET, TSH. 3. Chronic  anticoagulation with Coumadin - followed in Coumadin clinic with therapeutic INR last check.  4. H/o remote LV dysfunction that improved with restoration of NSR - last echo 2 yrs ago - check echo.  The patient was seen and examined in conjunction with Dr. Myrtis Ser. Prior records were extensively reviewed. The patient will have a f/u appointment in 3 weeks with Dr. Myrtis Ser. He was instructed to let us know if he has worsening symptoms in the interim.  Ronie Spies, PA-C 04/11/2012, 1:52 PM

## 2012-04-16 ENCOUNTER — Other Ambulatory Visit: Payer: Self-pay

## 2012-04-16 ENCOUNTER — Other Ambulatory Visit: Payer: Self-pay | Admitting: *Deleted

## 2012-04-16 ENCOUNTER — Other Ambulatory Visit (INDEPENDENT_AMBULATORY_CARE_PROVIDER_SITE_OTHER): Payer: Medicare Other

## 2012-04-16 DIAGNOSIS — I4892 Unspecified atrial flutter: Secondary | ICD-10-CM

## 2012-04-18 ENCOUNTER — Telehealth: Payer: Self-pay | Admitting: *Deleted

## 2012-04-18 NOTE — Telephone Encounter (Signed)
Message copied by Eustace Moore on Fri Apr 18, 2012  8:07 AM ------      Message from: Laurann Montana      Created: Wed Apr 16, 2012  2:45 PM       Isabelle Course, call pt and let him know the following:      - chest x-ray suggested COPD but no acute disease. I am not sure if this is contributing to his SOB though as that would be somewhat atypical for COPD      - 2D echo looked fairly good. Normal squeeze function of heart but does have mild thickening of heart muscle.       - otherwise labs look stable      - f/u with Dr. Myrtis Ser as scheduled - will discuss next steps at that appointment            Dr. Myrtis Ser:      With hx exertional weakness and recumbent SOB, do you think this pt would benefit from either Myoview testing (?angina decubitus) or PFTs (amio, nonsmoker, COPD-CXR) prior to his appt with you?

## 2012-04-18 NOTE — Telephone Encounter (Signed)
Patient informed and copy sent to PCP. 

## 2012-04-22 ENCOUNTER — Ambulatory Visit (INDEPENDENT_AMBULATORY_CARE_PROVIDER_SITE_OTHER): Payer: Medicare Other | Admitting: *Deleted

## 2012-04-22 DIAGNOSIS — Z7901 Long term (current) use of anticoagulants: Secondary | ICD-10-CM

## 2012-04-22 DIAGNOSIS — I4892 Unspecified atrial flutter: Secondary | ICD-10-CM

## 2012-04-24 ENCOUNTER — Encounter: Payer: Self-pay | Admitting: Cardiology

## 2012-04-24 ENCOUNTER — Telehealth: Payer: Self-pay | Admitting: *Deleted

## 2012-04-24 DIAGNOSIS — R9430 Abnormal result of cardiovascular function study, unspecified: Secondary | ICD-10-CM | POA: Insufficient documentation

## 2012-04-24 DIAGNOSIS — I10 Essential (primary) hypertension: Secondary | ICD-10-CM | POA: Insufficient documentation

## 2012-04-24 DIAGNOSIS — Z9229 Personal history of other drug therapy: Secondary | ICD-10-CM | POA: Insufficient documentation

## 2012-04-24 DIAGNOSIS — K589 Irritable bowel syndrome without diarrhea: Secondary | ICD-10-CM | POA: Insufficient documentation

## 2012-04-24 DIAGNOSIS — Z7901 Long term (current) use of anticoagulants: Secondary | ICD-10-CM | POA: Insufficient documentation

## 2012-04-24 NOTE — Telephone Encounter (Signed)
Message copied by Eustace Moore on Thu Apr 24, 2012  2:59 PM ------      Message from: Myrtis Ser, [place] D      Created: Thu Apr 24, 2012 10:35 AM       Please let him know that echo looks quite good

## 2012-04-24 NOTE — Telephone Encounter (Signed)
Patient informed. 

## 2012-04-30 ENCOUNTER — Ambulatory Visit (INDEPENDENT_AMBULATORY_CARE_PROVIDER_SITE_OTHER): Payer: Medicare Other | Admitting: Cardiology

## 2012-04-30 ENCOUNTER — Encounter: Payer: Self-pay | Admitting: Cardiology

## 2012-04-30 VITALS — BP 122/72 | HR 55 | Ht 71.0 in | Wt 212.0 lb

## 2012-04-30 DIAGNOSIS — I10 Essential (primary) hypertension: Secondary | ICD-10-CM

## 2012-04-30 DIAGNOSIS — Z7901 Long term (current) use of anticoagulants: Secondary | ICD-10-CM

## 2012-04-30 DIAGNOSIS — R0602 Shortness of breath: Secondary | ICD-10-CM

## 2012-04-30 DIAGNOSIS — I4892 Unspecified atrial flutter: Secondary | ICD-10-CM

## 2012-04-30 NOTE — Assessment & Plan Note (Signed)
Based on chest x-ray patient most likely has mild COPD. I have asked him to followup with his primary care physician and he may require pulmonary function tests and further therapy based on results.

## 2012-04-30 NOTE — Assessment & Plan Note (Signed)
Blood pressure controlled. Continue present medications. 

## 2012-04-30 NOTE — Patient Instructions (Signed)
Continue all current medications. Your physician wants you to follow up in: 6 months.  You will receive a reminder letter in the mail one-two months in advance.  If you don't receive a letter, please call our office to schedule the follow up appointment   

## 2012-04-30 NOTE — Assessment & Plan Note (Signed)
Patient remains in sinus rhythm. Continue amiodarone and Coumadin. Recent chest x-ray showed COPD but no infiltrates. Liver functions and TSH normal. Hemoglobin normal.

## 2012-04-30 NOTE — Assessment & Plan Note (Signed)
Goal INR 2-3

## 2012-04-30 NOTE — Progress Notes (Signed)
HPI: 78 y/o M previously followed by Dr Andee Lineman for fu of dyspnea; history of atrial flutter s/p RFCA with recurrence, treated with DCCV and amiodarone therapy (had recurrence on 100mg  daily so is taking 200mg  daily). He has had CHF in the past felt due to tachycardia-mediated cardiomyopathy, with improvement in his LV function in 2011 after maintenance of NSR. Last echocardiogram in October of 2013 showed an ejection fraction of 65-70%. There was mild to moderate left atrial enlargement and trace mitral regurgitation. Chest x-ray in October 2013 showed hyperinflation suggestive of COPD but no other disease. Recent TSH and hemoglobin normal. Patient last seen approximately 3 weeks ago for weakness and dyspnea. Since that time, he has mild dyspnea after going to the bathroom at night and laying back down. He denies orthopnea, PND, pedal edema, palpitations, syncope or chest pain.  Current Outpatient Prescriptions  Medication Sig Dispense Refill  . amiodarone (PACERONE) 200 MG tablet TAKE ONE BY MOUTH ONCE DAILY  30 tablet  6  . calcium-vitamin D (OSCAL WITH D) 500-200 MG-UNIT per tablet Take 1 tablet by mouth daily.        . Chromium Picolinate 200 MCG CAPS Take 200 mcg by mouth daily.        . Flaxseed, Linseed, (FLAXSEED OIL) 1000 MG CAPS Take 1 capsule by mouth daily.        [name] latanoprost (XALATAN) 0.005 % ophthalmic solution Place 1 drop into both eyes at bedtime.      . Multiple Vitamins-Minerals (MEGA MULTI MEN) TBCR Take 1 tablet by mouth daily.        . Omega-3 Fatty Acids (FISH OIL) 1000 MG CAPS Take 1 capsule by mouth daily.        [name] PROBIOTIC CAPS Take 1 capsule by mouth daily.      [name] Specialty Vitamins Products (ICAPS LUTEIN-ZEAXANTHIN PO) Take 1 tablet by mouth daily.        [name] Specialty Vitamins Products (UDAMIN SP) TABS Take 1 tablet by mouth Twice daily.      . Tamsulosin HCl (FLOMAX) 0.4 MG CAPS Take 0.4 capsules by mouth Daily.      [name] warfarin (COUMADIN) 5 MG tablet           Past Medical History  Diagnosis Date  . Atrial flutter, paroxysmal     a. Hx of TEE with LAA clots. b. s/p ESP/RFCA of atrial flutter 08/2008. b. Recurrence of A-flutter after ablation/atypical, requiring DCCV and amiodaorone.  . Left ventricular dysfunction     a. Prior EF 30-35%, felt tachycardia induced cardiomyopathyy. b. Improved after cardioversion and restoration of normal sinus rhythm - EF 70% by echo 2011.  [name] HTN (hypertension)   . IBS (irritable bowel syndrome)   . Ejection fraction     EF previously 30-35%, tachycardia induced, improved   //   EF 65-70%, echo, April 16, 2012  . Warfarin anticoagulation     for atrial flutter  . H/O amiodarone therapy     Used for atrial flutter  after return of flutter after ablation    Past Surgical History  Procedure Date  . Femoral hernia repair     x2  . Turp vaporization   . Total knee arthroplasty   . Umbilical hernia repair     History   Social History  . Marital Status: Married    Spouse Name: N/A    Number of Children: N/A  . Years of Education: N/A   Occupational History  . Not  on file.   Social History Main Topics  . Smoking status: Never Smoker   . Smokeless tobacco: Never Used     Comment: tobacco use- no  . Alcohol Use: No  . Drug Use: No  . Sexually Active: Not on file   Other Topics Concern  . Not on file   Social History Narrative   Retired, married.     ROS: no fevers or chills, productive cough, hemoptysis, dysphasia, odynophagia, melena, hematochezia, dysuria, hematuria, rash, seizure activity, orthopnea, PND, pedal edema, claudication. Remaining systems are negative.  Physical Exam: Well-developed well-nourished in no acute distress.  Skin is warm and dry.  HEENT is normal.  Neck is supple.  Chest is clear to auscultation with normal expansion.  Cardiovascular exam is regular rate and rhythm.  Abdominal exam nontender or distended. No masses palpated. Extremities show no  edema. neuro grossly intact   Electrocardiogram shows sinus rhythm with first degree AV block. Cannot rule out prior anterior and inferior infarct.

## 2012-06-06 ENCOUNTER — Ambulatory Visit (INDEPENDENT_AMBULATORY_CARE_PROVIDER_SITE_OTHER): Payer: Medicare Other | Admitting: *Deleted

## 2012-06-06 DIAGNOSIS — I4892 Unspecified atrial flutter: Secondary | ICD-10-CM

## 2012-06-06 DIAGNOSIS — Z7901 Long term (current) use of anticoagulants: Secondary | ICD-10-CM

## 2012-06-06 LAB — POCT INR: INR: 2.3

## 2012-06-27 ENCOUNTER — Ambulatory Visit: Payer: Medicare Other | Admitting: Cardiology

## 2012-07-08 ENCOUNTER — Other Ambulatory Visit: Payer: Self-pay | Admitting: Cardiology

## 2012-07-08 MED ORDER — AMIODARONE HCL 200 MG PO TABS: 200.0000 mg | ORAL_TABLET | Freq: Every day | ORAL | Status: DC

## 2012-07-18 ENCOUNTER — Ambulatory Visit (INDEPENDENT_AMBULATORY_CARE_PROVIDER_SITE_OTHER): Payer: Medicare Other | Admitting: *Deleted

## 2012-07-18 DIAGNOSIS — Z7901 Long term (current) use of anticoagulants: Secondary | ICD-10-CM

## 2012-07-18 DIAGNOSIS — I4892 Unspecified atrial flutter: Secondary | ICD-10-CM

## 2012-09-05 ENCOUNTER — Ambulatory Visit (INDEPENDENT_AMBULATORY_CARE_PROVIDER_SITE_OTHER): Payer: Medicare Other | Admitting: *Deleted

## 2012-09-05 DIAGNOSIS — Z7901 Long term (current) use of anticoagulants: Secondary | ICD-10-CM

## 2012-09-05 DIAGNOSIS — I4892 Unspecified atrial flutter: Secondary | ICD-10-CM

## 2012-09-05 LAB — POCT INR: INR: 3.2

## 2012-10-03 ENCOUNTER — Ambulatory Visit (INDEPENDENT_AMBULATORY_CARE_PROVIDER_SITE_OTHER): Payer: Medicare Other | Admitting: *Deleted

## 2012-10-03 DIAGNOSIS — I4892 Unspecified atrial flutter: Secondary | ICD-10-CM

## 2012-10-03 DIAGNOSIS — Z7901 Long term (current) use of anticoagulants: Secondary | ICD-10-CM

## 2012-10-03 LAB — POCT INR: INR: 3.2

## 2012-10-27 ENCOUNTER — Encounter: Payer: Self-pay | Admitting: Cardiology

## 2012-10-29 ENCOUNTER — Ambulatory Visit: Payer: Medicare Other | Admitting: Cardiology

## 2012-10-29 ENCOUNTER — Encounter: Payer: Self-pay | Admitting: Cardiology

## 2012-10-29 ENCOUNTER — Ambulatory Visit (INDEPENDENT_AMBULATORY_CARE_PROVIDER_SITE_OTHER): Payer: Medicare Other | Admitting: Cardiology

## 2012-10-29 VITALS — BP 125/67 | HR 54 | Ht 70.0 in | Wt 208.0 lb

## 2012-10-29 DIAGNOSIS — I10 Essential (primary) hypertension: Secondary | ICD-10-CM

## 2012-10-29 DIAGNOSIS — I429 Cardiomyopathy, unspecified: Secondary | ICD-10-CM

## 2012-10-29 DIAGNOSIS — I4892 Unspecified atrial flutter: Secondary | ICD-10-CM

## 2012-10-29 NOTE — Assessment & Plan Note (Signed)
Quiescent at this time, plan to continue Coumadin and amiodarone. Surveillance lab work and chest x-ray noted.

## 2012-10-29 NOTE — Assessment & Plan Note (Signed)
Tachycardia mediated with improvement to normal function, most recent LVEF 65-70%.

## 2012-10-29 NOTE — Patient Instructions (Addendum)

## 2012-10-29 NOTE — Assessment & Plan Note (Signed)
Blood pressure is normal today. 

## 2012-10-29 NOTE — Progress Notes (Signed)
Clinical Summary [name] is a 79 y.o.male presenting for followup. This is my first meeting with him. He was seen previously Dr. Andee Ortiz, most recently by Dr. Jens Ortiz in November 2013.  Echocardiogram in October 2013 showed mild LVH with LVEF 65-70%, grade 1 diastolic dysfunction with increased filling pressures, trivial MR, mildly to moderately dilated left atrium, trivial TR.  Lab work in October 2013 revealed creatinine 1.2, normal LFTs, TSH 4.0, hemoglobin 16.1. Chest x-ray at that time demonstrated no acute disease with hyperinflation suggestive of COPD.  He is here with his wife today. Complains mainly of chronic fatigue, but no chest pain, no progressive exertional dyspnea, and no palpitations. His ECG shows normal sinus rhythm with prolonged PR interval, normal QTC. He reports no bleeding problems with Coumadin.  Allergies  Allergen Reactions  . Penicillins   . Sulfonamide Derivatives     Current Outpatient Prescriptions  Medication Sig Dispense Refill  . amiodarone (PACERONE) 200 MG tablet Take 1 tablet (200 mg total) by mouth daily.  30 tablet  2  . Flaxseed, Linseed, (FLAXSEED OIL) 1000 MG CAPS Take 1 capsule by mouth daily.        [name] latanoprost (XALATAN) 0.005 % ophthalmic solution Place 1 drop into both eyes at bedtime.      . Multiple Vitamins-Minerals (MEGA MULTI MEN) TBCR Take 1 tablet by mouth daily.        . Omega-3 Fatty Acids (FISH OIL) 1000 MG CAPS Take 1 capsule by mouth daily.        [name] PROBIOTIC CAPS Take 1 capsule by mouth daily.      [name] Specialty Vitamins Products (ICAPS LUTEIN-ZEAXANTHIN PO) Take 1 tablet by mouth daily.        [name] Specialty Vitamins Products (UDAMIN SP) TABS Take 1 tablet by mouth Twice daily.      . Tamsulosin HCl (FLOMAX) 0.4 MG CAPS Take 0.4 capsules by mouth Daily.      [name] warfarin (COUMADIN) 5 MG tablet        No current facility-administered medications for this visit.    Past Medical History  Diagnosis Date  . Atrial flutter,  paroxysmal     a. Hx of TEE with LAA clots. b. s/p ESP/RFCA of atrial flutter 08/2008. b. Recurrence of A-flutter after ablation/atypical, requiring DCCV and amiodaorone.  . Cardiomyopathy     a. Prior EF 30-35%, felt tachycardia induced cardiomyopathyy. b. Improved after cardioversion and restoration of normal sinus rhythm - EF 65-70% by echo 10/13  . Essential hypertension, benign   . IBS (irritable bowel syndrome)   . Warfarin anticoagulation     For atrial flutter  . H/O amiodarone therapy     Used for atrial flutter after return of flutter after ablation    Social History [name] reports that he has never smoked. He has never used smokeless tobacco. [name] reports that he does not drink alcohol.  Review of Systems Negative except as outlined.  Physical Examination Filed Vitals:   10/29/12 0846  BP: 125/67  Pulse: 54   Filed Weights   10/29/12 0846  Weight: 208 lb (94.348 kg)   Overweight male in no acute distress. HEENT: Conjunctiva and lids normal, oropharynx clear. Neck: Supple, no elevated JVP or carotid bruits, no thyromegaly. Lungs: Clear to auscultation, nonlabored breathing at rest. Cardiac: Regular rate and rhythm, no S3 or significant systolic murmur, no pericardial rub. Abdomen: Soft, nontender, bowel sounds present. Extremities: No pitting edema, distal pulses 2+. Skin: Warm and  dry. Musculoskeletal: No kyphosis. Neuropsychiatric: Alert and oriented x3, affect grossly appropriate.   Problem List and Plan   ATRIAL FLUTTER, PAROXYSMAL Quiescent at this time, plan to continue Coumadin and amiodarone. Surveillance lab work and chest x-ray noted.  Secondary cardiomyopathy, unspecified Tachycardia mediated with improvement to normal function, most recent LVEF 65-70%.  Essential hypertension, benign Blood pressure is normal today.    [name], M.D., F.A.C.C.

## 2012-10-31 ENCOUNTER — Ambulatory Visit (INDEPENDENT_AMBULATORY_CARE_PROVIDER_SITE_OTHER): Payer: Medicare Other | Admitting: *Deleted

## 2012-10-31 DIAGNOSIS — Z7901 Long term (current) use of anticoagulants: Secondary | ICD-10-CM

## 2012-10-31 DIAGNOSIS — I4892 Unspecified atrial flutter: Secondary | ICD-10-CM

## 2012-11-01 ENCOUNTER — Other Ambulatory Visit: Payer: Self-pay | Admitting: Physician Assistant

## 2012-11-28 ENCOUNTER — Ambulatory Visit (INDEPENDENT_AMBULATORY_CARE_PROVIDER_SITE_OTHER): Payer: Medicare Other | Admitting: *Deleted

## 2012-11-28 DIAGNOSIS — Z7901 Long term (current) use of anticoagulants: Secondary | ICD-10-CM

## 2012-11-28 DIAGNOSIS — I4892 Unspecified atrial flutter: Secondary | ICD-10-CM

## 2012-11-28 LAB — POCT INR: INR: 2.4

## 2012-12-23 ENCOUNTER — Encounter: Payer: Self-pay | Admitting: Cardiovascular Disease

## 2012-12-23 ENCOUNTER — Ambulatory Visit (INDEPENDENT_AMBULATORY_CARE_PROVIDER_SITE_OTHER): Payer: Medicare Other | Admitting: Cardiovascular Disease

## 2012-12-23 VITALS — BP 121/70 | HR 56 | Ht 70.0 in | Wt 209.0 lb

## 2012-12-23 DIAGNOSIS — I10 Essential (primary) hypertension: Secondary | ICD-10-CM

## 2012-12-23 DIAGNOSIS — R0602 Shortness of breath: Secondary | ICD-10-CM | POA: Insufficient documentation

## 2012-12-23 DIAGNOSIS — I429 Cardiomyopathy, unspecified: Secondary | ICD-10-CM

## 2012-12-23 NOTE — Progress Notes (Signed)
Patient ID: [name], male   DOB: 1934-06-25, 79 y.o.   MRN: [id number]    SUBJECTIVE: [name] is a 79 y.o.male presenting for followup. This is my first meeting with him. He was seen previously by Dr. Diona Browner, most recently in May 2014.  Echocardiogram in October 2013 showed mild LVH with LVEF 65-70%, grade 1 diastolic dysfunction with increased filling pressures, trivial MR, mildly to moderately dilated left atrium, trivial TR.    He is here with his wife today. He denies chest pain and palpitations. He's been sweating at night more lately, and says he sometimes has difficulty breathing while lying down, but denies waking up feeling exceedingly short of breath. 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  No recent problems with Warfarin (bleeding).  Past Medical History: Atrial flutter, paroxysmal  a. Hx of TEE with LAA clots. b. s/p ESP/RFCA of atrial flutter 08/2008. b. Recurrence of A-flutter after ablation/atypical, requiring DCCV and amiodaorone.  .  Cardiomyopathy  a. Prior EF 30-35%, felt tachycardia induced cardiomyopathyy. b. Improved after cardioversion and restoration of normal sinus rhythm - EF 65-70% by echo 10/13  .  Essential hypertension, benign  .  IBS (irritable bowel syndrome)  .  Warfarin anticoagulation  For atrial flutter  .  H/O amiodarone therapy  Used for atrial flutter after return of flutter after ablation     Filed Vitals:   12/23/12 1341  BP: 121/70  Pulse: 56  Height: 5\' 10"  (1.778 m)  Weight: 209 lb (94.802 kg)  SpO2: 95%     PHYSICAL EXAM General: NAD Neck: No JVD, no thyromegaly or thyroid nodule.  Lungs: Clear to auscultation bilaterally with normal respiratory effort. Slightly diminished on right lung fields, but normal to percussion bilaterally (no dullness). CV: Nondisplaced PMI.  Heart regular S1/S2, no S3/S4, no murmur.  No peripheral  edema.  No carotid bruit.  Normal pedal pulses.  Abdomen: Soft, nontender, no hepatosplenomegaly, no distention.  Neurologic: Alert and oriented x 3.  Psych: Normal affect. Extremities: No clubbing or cyanosis.     LABS: Basic Metabolic Panel: No results found for this basename: NA, K, CL, CO2, GLUCOSE, BUN, CREATININE, CALCIUM, MG, PHOS,  in the last 72 hours Liver Function Tests: No results found for this basename: AST, ALT, ALKPHOS, BILITOT, PROT, ALBUMIN,  in the last 72 hours No results found for this basename: LIPASE, AMYLASE,  in the last 72 hours CBC: No results found for this basename: WBC, NEUTROABS, HGB, HCT, MCV, PLT,  in the last 72 hours Cardiac Enzymes: No results found for this basename: CKTOTAL, CKMB, CKMBINDEX, TROPONINI,  in the last 72 hours BNP: No components found with this basename: POCBNP,  D-Dimer: No results found for this basename: DDIMER,  in the last 72 hours Hemoglobin A1C: No results found for this basename: HGBA1C,  in the last 72 hours Fasting Lipid Panel: No results found for this basename: CHOL, HDL, LDLCALC, TRIG, CHOLHDL, LDLDIRECT,  in the last 72 hours Thyroid Function Tests: No results found for this basename: TSH, T4TOTAL, FREET3, T3FREE, THYROIDAB,  in the last 72 hours Anemia Panel: No results found for this basename: VITAMINB12, FOLATE, FERRITIN, TIBC, IRON, RETICCTPCT,  in the last 72 hours  RADIOLOGY: No results found.    ASSESSMENT AND PLAN:  1. Shortness of breath     Given that he is in a normal rhythm and denies palpitations, has no edema, and  has no symptoms with activity, this is unlikely to be ischemic in etiology. His last echocardiogram showed normal LV systolic function, and I don't feel there's a need to repeat this. His most recent RVSP was 20 mmHg. I will obtain a chest x-ray and proceed from there, as I want to make sure there have been no chronic effects from Amiodarone. I will obtain PFT's as well if these have not  been done recently. Further management pending the aforementioned results. If these are abnormal, would consider Pulmonary consultation.  2. ATRIAL FLUTTER, PAROXYSMAL  Quiescent at this time, plan to continue Coumadin and amiodarone.   3. Secondary cardiomyopathy, unspecified  Tachycardia mediated with improvement to normal function, most recent LVEF 65-70%.   4. Essential hypertension, benign  Blood pressure is normal today.  Prentice Docker, MD, [facility]

## 2012-12-23 NOTE — Patient Instructions (Addendum)
Your physician recommends that you schedule a follow-up appointment in: as scheduled with Dr. Diona Browner. Your physician recommends that you continue on your current medications as directed. Please refer to the Current Medication list given to you today. Your physician has recommended that you have a pulmonary function test. Pulmonary Function Tests are a group of tests that measure how well air moves in and out of your lungs. A chest x-ray takes a picture of the organs and structures inside the chest, including the heart, lungs, and blood vessels. This test can show several things, including, whether the heart is enlarges; whether fluid is building up in the lungs; and whether pacemaker / defibrillator leads are still in place.

## 2012-12-25 LAB — PULMONARY FUNCTION TEST

## 2012-12-31 ENCOUNTER — Telehealth: Payer: Self-pay | Admitting: Cardiovascular Disease

## 2012-12-31 NOTE — Telephone Encounter (Signed)
Mrs. Maniaci called wanting to know if we had test results back on Mr. Quattrone complete PFT's. They have not been read per Toniann Fail In Respiratory Dept at [facility] due to Dr. Orson Aloe is on vacation until next week. Chest xray report is in the system. Mrs. Parkerson Would like a telephone call back. Will need to check with [facility] next week for test results.

## 2013-01-02 ENCOUNTER — Telehealth: Payer: Self-pay | Admitting: *Deleted

## 2013-01-02 DIAGNOSIS — R942 Abnormal results of pulmonary function studies: Secondary | ICD-10-CM

## 2013-01-02 NOTE — Telephone Encounter (Signed)
Patient notified.  Will be referred to Dr. Orson Aloe.

## 2013-01-02 NOTE — Telephone Encounter (Signed)
Message copied by Lesle Chris on Fri Jan 02, 2013  9:08 AM ------      Message from: Prentice Docker A      Created: Wed Dec 31, 2012 11:58 AM       Please have pt f/u with Pulmonary. ------

## 2013-01-02 NOTE — Telephone Encounter (Signed)
Notes Recorded by Lesle Chris, LPN on 9/60/4540 at 9:08 AM Patient notified. Will forward to [facility]) to do referral.

## 2013-01-06 ENCOUNTER — Ambulatory Visit (INDEPENDENT_AMBULATORY_CARE_PROVIDER_SITE_OTHER): Payer: Medicare Other | Admitting: *Deleted

## 2013-01-06 DIAGNOSIS — Z7901 Long term (current) use of anticoagulants: Secondary | ICD-10-CM

## 2013-01-06 DIAGNOSIS — I4892 Unspecified atrial flutter: Secondary | ICD-10-CM

## 2013-02-13 ENCOUNTER — Other Ambulatory Visit: Payer: Self-pay | Admitting: Urology

## 2013-02-17 ENCOUNTER — Ambulatory Visit (INDEPENDENT_AMBULATORY_CARE_PROVIDER_SITE_OTHER): Payer: Medicare Other | Admitting: *Deleted

## 2013-02-17 ENCOUNTER — Telehealth: Payer: Self-pay | Admitting: Cardiovascular Disease

## 2013-02-17 DIAGNOSIS — Z7901 Long term (current) use of anticoagulants: Secondary | ICD-10-CM

## 2013-02-17 DIAGNOSIS — I4892 Unspecified atrial flutter: Secondary | ICD-10-CM

## 2013-02-17 NOTE — Telephone Encounter (Signed)
Patient is having pulmonary [facility] on August 18 @7pm   Pulmonary test August 26th @ 1pm at [facility]  Patient wanting to know if he needs to have procedure at [facility]

## 2013-02-19 ENCOUNTER — Telehealth: Payer: Self-pay | Admitting: Cardiovascular Disease

## 2013-02-19 NOTE — Telephone Encounter (Signed)
Spoke with [name] about his procedure for TURP. Dr. Purvis Sheffield is suppose to be giving approval for surgery.

## 2013-02-25 ENCOUNTER — Telehealth: Payer: Self-pay | Admitting: *Deleted

## 2013-02-25 NOTE — Telephone Encounter (Signed)
Mr. Heffron is scheduled to have a TURP on 03-12-13 @ [facility]. Dr. Pecolia Ades office called and needs to Know if he can come off coumdin 5 days prior to his surgery or per recommendation.  Please call Pam @ [phone] ext [phone].

## 2013-02-25 NOTE — Telephone Encounter (Signed)
LMOM for [name] that it is OK for pt to hold coumadin 5 days prior to TURP per Dr Diona Browner.  Adivsed if they do not have EPIC to call for faxed copy if needed.

## 2013-02-25 NOTE — Telephone Encounter (Signed)
His note says to follow back up with you as previously planned.  So hold 5 days?

## 2013-02-25 NOTE — Telephone Encounter (Signed)
It looks like Mr. Bogle is now following with Dr. Purvis Sheffield, please see his office note from July. I would think that he could hold Coumadin as requested, but would defer to Dr. Purvis Sheffield and keep him involved in the decision.

## 2013-02-25 NOTE — Telephone Encounter (Signed)
Yes

## 2013-02-25 NOTE — Telephone Encounter (Signed)
Please advise 

## 2013-03-03 ENCOUNTER — Encounter (HOSPITAL_COMMUNITY): Payer: Self-pay | Admitting: Pharmacy Technician

## 2013-03-03 ENCOUNTER — Ambulatory Visit (INDEPENDENT_AMBULATORY_CARE_PROVIDER_SITE_OTHER): Payer: Medicare Other | Admitting: *Deleted

## 2013-03-03 DIAGNOSIS — I4892 Unspecified atrial flutter: Secondary | ICD-10-CM

## 2013-03-03 DIAGNOSIS — Z7901 Long term (current) use of anticoagulants: Secondary | ICD-10-CM

## 2013-03-04 NOTE — Progress Notes (Addendum)
lov note dr Purvis Sheffield cardiology 12-23-2012 epic Echo 04-16-2012 epic  chest 2 view xray10-18-2013 epic 12-25-2012 pulmonary function test epic ekg 10-29-2012 epic Pt reports sob when lies down, chest xray done at pre op visit 03-06-2013

## 2013-03-04 NOTE — Patient Instructions (Addendum)
[address]  03/04/2013   Your procedure is scheduled on: 03-12-2013  Report to [facility] at 630 AM.  Call this number if you have problems the morning of surgery [phone]   Remember:   Do not eat food or drink liquids :After Midnight.     Take these medicines the morning of surgery with A SIP OF WATER: amiodarone                                SEE [facility] PREPARING FOR SURGERY SHEET   Do not wear jewelry, make-up.  Do not wear lotions, powders, or perfumes. You may wear deodorant.   Men may shave face and neck.  Do not bring valuables to the hospital. [facility] IS NOT RESPONSIBLE FOR VALUEABLES.  Contacts, dentures or bridgework may not be worn into surgery.  Leave suitcase in the car. After surgery it may be brought to your room.  For patients admitted to the hospital, checkout time is 11:00 AM the day of discharge.   Patients discharged the day of surgery will not be allowed to drive home.  Name and phone number of your driver:  Special Instructions: N/A   Please read over the following fact sheets that you were given:   Call Cain Sieve RN pre op nurse if needed [phone]    FAILURE TO FOLLOW THESE INSTRUCTIONS MAY RESULT IN THE CANCELLATION OF YOUR SURGERY.  PATIENT SIGNATURE___________________________________________  NURSE SIGNATURE_____________________________________________

## 2013-03-05 ENCOUNTER — Other Ambulatory Visit: Payer: Self-pay | Admitting: Cardiology

## 2013-03-05 MED ORDER — WARFARIN SODIUM 5 MG PO TABS: 2.5000 mg | ORAL_TABLET | Freq: Every day | ORAL | Status: DC

## 2013-03-06 ENCOUNTER — Encounter (HOSPITAL_COMMUNITY)
Admission: RE | Admit: 2013-03-06 | Discharge: 2013-03-06 | Disposition: A | Discharge status: Home / Self Care | Payer: Medicare Other | Source: Ambulatory Visit | Attending: Urology | Admitting: Urology

## 2013-03-06 ENCOUNTER — Encounter (HOSPITAL_COMMUNITY): Payer: Self-pay

## 2013-03-06 ENCOUNTER — Ambulatory Visit (HOSPITAL_COMMUNITY)
Admission: RE | Admit: 2013-03-06 | Discharge: 2013-03-06 | Disposition: A | Discharge status: Home / Self Care | Payer: Medicare Other | Source: Ambulatory Visit | Attending: Urology | Admitting: Urology

## 2013-03-06 DIAGNOSIS — I4892 Unspecified atrial flutter: Secondary | ICD-10-CM | POA: Insufficient documentation

## 2013-03-06 DIAGNOSIS — N400 Enlarged prostate without lower urinary tract symptoms: Secondary | ICD-10-CM | POA: Insufficient documentation

## 2013-03-06 DIAGNOSIS — I428 Other cardiomyopathies: Secondary | ICD-10-CM | POA: Insufficient documentation

## 2013-03-06 DIAGNOSIS — I10 Essential (primary) hypertension: Secondary | ICD-10-CM | POA: Insufficient documentation

## 2013-03-06 DIAGNOSIS — R0602 Shortness of breath: Secondary | ICD-10-CM | POA: Insufficient documentation

## 2013-03-06 DIAGNOSIS — Z01812 Encounter for preprocedural laboratory examination: Secondary | ICD-10-CM | POA: Insufficient documentation

## 2013-03-06 HISTORY — DX: Benign prostatic hyperplasia without lower urinary tract symptoms: N40.0

## 2013-03-06 HISTORY — DX: Nocturia: R35.1

## 2013-03-06 HISTORY — DX: Shortness of breath: R06.02

## 2013-03-06 LAB — BASIC METABOLIC PANEL
BUN: 19 mg/dL (ref 6–23)
Calcium: 9.5 mg/dL (ref 8.4–10.5)
GFR calc non Af Amer: 60 mL/min — ABNORMAL LOW (ref >90)
Glucose, Bld: 126 mg/dL — ABNORMAL HIGH (ref 70–99)
Sodium: 139 mEq/L (ref 135–145)

## 2013-03-06 LAB — CBC
Hemoglobin: 15.9 g/dL (ref 13.0–17.0)
MCH: 32.4 pg (ref 26.0–34.0)
MCHC: 33.4 g/dL (ref 30.0–36.0)
RDW: 14 % (ref 11.5–15.5)

## 2013-03-06 LAB — PROTIME-INR: Prothrombin Time: 24.6 seconds — ABNORMAL HIGH (ref 11.6–15.2)

## 2013-03-12 ENCOUNTER — Ambulatory Visit (HOSPITAL_COMMUNITY): Payer: Medicare Other | Admitting: Certified Registered Nurse Anesthetist

## 2013-03-12 ENCOUNTER — Encounter (HOSPITAL_COMMUNITY)
Admission: RE | Disposition: A | Discharge status: Home / Self Care | Payer: Self-pay | Source: Ambulatory Visit | Attending: Urology

## 2013-03-12 ENCOUNTER — Encounter (HOSPITAL_COMMUNITY): Payer: Self-pay | Admitting: Certified Registered Nurse Anesthetist

## 2013-03-12 ENCOUNTER — Inpatient Hospital Stay (HOSPITAL_COMMUNITY)
Admission: RE | Admit: 2013-03-12 | Discharge: 2013-03-14 | DRG: 714 | Disposition: A | Discharge status: Home / Self Care | Payer: Medicare Other | Source: Ambulatory Visit | Attending: Urology | Admitting: Urology

## 2013-03-12 ENCOUNTER — Encounter (HOSPITAL_COMMUNITY): Payer: Self-pay | Admitting: *Deleted

## 2013-03-12 DIAGNOSIS — N3941 Urge incontinence: Secondary | ICD-10-CM | POA: Diagnosis present

## 2013-03-12 DIAGNOSIS — N401 Enlarged prostate with lower urinary tract symptoms: Principal | ICD-10-CM | POA: Diagnosis present

## 2013-03-12 DIAGNOSIS — R39198 Other difficulties with micturition: Secondary | ICD-10-CM | POA: Diagnosis present

## 2013-03-12 DIAGNOSIS — N318 Other neuromuscular dysfunction of bladder: Secondary | ICD-10-CM | POA: Diagnosis present

## 2013-03-12 DIAGNOSIS — Z79899 Other long term (current) drug therapy: Secondary | ICD-10-CM

## 2013-03-12 DIAGNOSIS — Z7901 Long term (current) use of anticoagulants: Secondary | ICD-10-CM

## 2013-03-12 DIAGNOSIS — N138 Other obstructive and reflux uropathy: Principal | ICD-10-CM | POA: Diagnosis present

## 2013-03-12 DIAGNOSIS — Z87891 Personal history of nicotine dependence: Secondary | ICD-10-CM

## 2013-03-12 DIAGNOSIS — I4891 Unspecified atrial fibrillation: Secondary | ICD-10-CM | POA: Diagnosis present

## 2013-03-12 DIAGNOSIS — R350 Frequency of micturition: Secondary | ICD-10-CM | POA: Diagnosis present

## 2013-03-12 DIAGNOSIS — N319 Neuromuscular dysfunction of bladder, unspecified: Secondary | ICD-10-CM | POA: Diagnosis present

## 2013-03-12 DIAGNOSIS — N3289 Other specified disorders of bladder: Secondary | ICD-10-CM | POA: Diagnosis present

## 2013-03-12 DIAGNOSIS — R351 Nocturia: Secondary | ICD-10-CM | POA: Diagnosis present

## 2013-03-12 DIAGNOSIS — M129 Arthropathy, unspecified: Secondary | ICD-10-CM | POA: Diagnosis present

## 2013-03-12 HISTORY — PX: TRANSURETHRAL PROSTATECTOMY WITH GYRUS INSTRUMENTS: SHX6153

## 2013-03-12 LAB — CBC
HCT: 45.2 % (ref 39.0–52.0)
Hemoglobin: 15.4 g/dL (ref 13.0–17.0)
MCV: 95.8 fL (ref 78.0–100.0)
WBC: 8.9 10*3/uL (ref 4.0–10.5)

## 2013-03-12 LAB — CREATININE, SERUM
GFR calc Af Amer: 76 mL/min — ABNORMAL LOW (ref >90)
GFR calc non Af Amer: 65 mL/min — ABNORMAL LOW (ref >90)

## 2013-03-12 SURGERY — TRANSURETHRAL PROSTATECTOMY WITH GYRUS INSTRUMENTS
Anesthesia: General | Wound class: Clean Contaminated

## 2013-03-12 MED ORDER — LACTATED RINGERS IV SOLN: INTRAVENOUS | Status: DC

## 2013-03-12 MED ORDER — FLAXSEED OIL 1000 MG PO CAPS: 1.0000 | ORAL_CAPSULE | Freq: Every day | ORAL | Status: DC

## 2013-03-12 MED ORDER — MEPERIDINE HCL 50 MG/ML IJ SOLN: 6.2500 mg | INTRAMUSCULAR | Status: DC | PRN

## 2013-03-12 MED ORDER — MENTHOL 3 MG MT LOZG
1.0000 | LOZENGE | OROMUCOSAL | Status: DC | PRN
  Administered 2013-03-12: 3 mg via ORAL
  Filled 2013-03-12: qty 9

## 2013-03-12 MED ORDER — LATANOPROST 0.005 % OP SOLN
1.0000 [drp] | Freq: Every day | OPHTHALMIC | Status: DC
  Administered 2013-03-12 – 2013-03-13 (×2): 1 [drp] via OPHTHALMIC
  Filled 2013-03-12: qty 2.5

## 2013-03-12 MED ORDER — FENTANYL CITRATE 0.05 MG/ML IJ SOLN
25.0000 ug | INTRAMUSCULAR | Status: DC | PRN
  Administered 2013-03-12 (×2): 50 ug via INTRAVENOUS

## 2013-03-12 MED ORDER — PHENOL 1.4 % MT LIQD
1.0000 | OROMUCOSAL | Status: DC | PRN
  Administered 2013-03-12: 1 via OROMUCOSAL
  Filled 2013-03-12: qty 177

## 2013-03-12 MED ORDER — ADULT MULTIVITAMIN W/MINERALS CH
1.0000 | ORAL_TABLET | Freq: Every day | ORAL | Status: DC
  Administered 2013-03-12 – 2013-03-13 (×2): 1 via ORAL
  Filled 2013-03-12 (×3): qty 1

## 2013-03-12 MED ORDER — PROMETHAZINE HCL 25 MG/ML IJ SOLN: 6.2500 mg | INTRAMUSCULAR | Status: DC | PRN

## 2013-03-12 MED ORDER — CHROMIUM PICOLINATE 200 MCG PO TABS
1.0000 | ORAL_TABLET | Freq: Every day | ORAL | Status: DC
Start: 2013-03-12 — End: 2013-03-12

## 2013-03-12 MED ORDER — HYDROCODONE-ACETAMINOPHEN 5-325 MG PO TABS
1.0000 | ORAL_TABLET | ORAL | Status: DC | PRN
  Administered 2013-03-12: 2 via ORAL
  Administered 2013-03-13 – 2013-03-14 (×3): 1 via ORAL
  Filled 2013-03-12 (×2): qty 1
  Filled 2013-03-12: qty 2
  Filled 2013-03-12: qty 1

## 2013-03-12 MED ORDER — BIOTENE DRY MOUTH MT LIQD
15.0000 mL | Freq: Two times a day (BID) | OROMUCOSAL | Status: DC
  Administered 2013-03-12 – 2013-03-14 (×4): 15 mL via OROMUCOSAL

## 2013-03-12 MED ORDER — AMIODARONE HCL 200 MG PO TABS
200.0000 mg | ORAL_TABLET | Freq: Every morning | ORAL | Status: DC
  Administered 2013-03-13: 200 mg via ORAL
  Filled 2013-03-12 (×2): qty 1

## 2013-03-12 MED ORDER — BACITRACIN-NEOMYCIN-POLYMYXIN 400-5-5000 EX OINT: 1.0000 "application " | TOPICAL_OINTMENT | Freq: Three times a day (TID) | CUTANEOUS | Status: DC | PRN

## 2013-03-12 MED ORDER — LIDOCAINE HCL 2 % EX GEL
CUTANEOUS | Status: AC
  Filled 2013-03-12: qty 10

## 2013-03-12 MED ORDER — BELLADONNA ALKALOIDS-OPIUM 16.2-60 MG RE SUPP
RECTAL | Status: DC | PRN
  Administered 2013-03-12: 1 via RECTAL

## 2013-03-12 MED ORDER — FENTANYL CITRATE 0.05 MG/ML IJ SOLN
INTRAMUSCULAR | Status: DC | PRN
  Administered 2013-03-12 (×2): 25 ug via INTRAVENOUS

## 2013-03-12 MED ORDER — SODIUM CHLORIDE 0.9 % IR SOLN
Status: DC | PRN
  Administered 2013-03-12 (×5): 3000 mL

## 2013-03-12 MED ORDER — CALCIUM CARBONATE-VITAMIN D 500-200 MG-UNIT PO TABS
1.0000 | ORAL_TABLET | Freq: Every day | ORAL | Status: DC
  Administered 2013-03-13 – 2013-03-14 (×2): 1 via ORAL
  Filled 2013-03-12 (×3): qty 1

## 2013-03-12 MED ORDER — FENTANYL CITRATE 0.05 MG/ML IJ SOLN
INTRAMUSCULAR | Status: AC
  Filled 2013-03-12: qty 2

## 2013-03-12 MED ORDER — DIPHENHYDRAMINE HCL 50 MG/ML IJ SOLN: 12.5000 mg | Freq: Four times a day (QID) | INTRAMUSCULAR | Status: DC | PRN

## 2013-03-12 MED ORDER — HYDROMORPHONE HCL PF 1 MG/ML IJ SOLN: 0.5000 mg | INTRAMUSCULAR | Status: DC | PRN

## 2013-03-12 MED ORDER — SODIUM CHLORIDE 0.45 % IV SOLN
INTRAVENOUS | Status: DC
  Administered 2013-03-12 (×2): via INTRAVENOUS

## 2013-03-12 MED ORDER — BELLADONNA ALKALOIDS-OPIUM 16.2-60 MG RE SUPP
RECTAL | Status: AC
  Filled 2013-03-12: qty 1

## 2013-03-12 MED ORDER — LACTATED RINGERS IV SOLN
INTRAVENOUS | Status: DC
  Administered 2013-03-12: 1000 mL via INTRAVENOUS
  Administered 2013-03-12: 10:00:00 via INTRAVENOUS

## 2013-03-12 MED ORDER — CIPROFLOXACIN IN D5W 400 MG/200ML IV SOLN
INTRAVENOUS | Status: AC
  Filled 2013-03-12: qty 200

## 2013-03-12 MED ORDER — OXYBUTYNIN CHLORIDE 5 MG PO TABS
5.0000 mg | ORAL_TABLET | Freq: Three times a day (TID) | ORAL | Status: DC | PRN
  Administered 2013-03-12 – 2013-03-14 (×4): 5 mg via ORAL
  Filled 2013-03-12 (×5): qty 1

## 2013-03-12 MED ORDER — BISACODYL 5 MG PO TBEC: 5.0000 mg | DELAYED_RELEASE_TABLET | Freq: Every day | ORAL | Status: DC | PRN

## 2013-03-12 MED ORDER — PROPOFOL 10 MG/ML IV BOLUS
INTRAVENOUS | Status: DC | PRN
  Administered 2013-03-12: 150 mg via INTRAVENOUS

## 2013-03-12 MED ORDER — ZOLPIDEM TARTRATE 5 MG PO TABS: 5.0000 mg | ORAL_TABLET | Freq: Every evening | ORAL | Status: DC | PRN

## 2013-03-12 MED ORDER — ONDANSETRON HCL 4 MG/2ML IJ SOLN: 4.0000 mg | INTRAMUSCULAR | Status: DC | PRN

## 2013-03-12 MED ORDER — CIPROFLOXACIN IN D5W 400 MG/200ML IV SOLN
400.0000 mg | INTRAVENOUS | Status: AC
  Administered 2013-03-12: 400 mg via INTRAVENOUS

## 2013-03-12 MED ORDER — CIPROFLOXACIN HCL 500 MG PO TABS
500.0000 mg | ORAL_TABLET | Freq: Two times a day (BID) | ORAL | Status: DC
  Administered 2013-03-12 – 2013-03-13 (×3): 500 mg via ORAL
  Filled 2013-03-12 (×6): qty 1

## 2013-03-12 MED ORDER — LIDOCAINE HCL (CARDIAC) 20 MG/ML IV SOLN
INTRAVENOUS | Status: DC | PRN
  Administered 2013-03-12: 50 mg via INTRAVENOUS

## 2013-03-12 MED ORDER — DIPHENHYDRAMINE HCL 12.5 MG/5ML PO ELIX: 12.5000 mg | ORAL_SOLUTION | Freq: Four times a day (QID) | ORAL | Status: DC | PRN

## 2013-03-12 MED ORDER — ENOXAPARIN SODIUM 30 MG/0.3ML ~~LOC~~ SOLN
30.0000 mg | SUBCUTANEOUS | Status: DC
  Administered 2013-03-13: 08:00:00 30 mg via SUBCUTANEOUS
  Filled 2013-03-12 (×3): qty 0.3

## 2013-03-12 MED ORDER — ONDANSETRON HCL 4 MG/2ML IJ SOLN
INTRAMUSCULAR | Status: DC | PRN
  Administered 2013-03-12: 4 mg via INTRAVENOUS

## 2013-03-12 SURGICAL SUPPLY — 31 items
BAG URINE DRAINAGE (UROLOGICAL SUPPLIES) ×1 IMPLANT
BAG URO CATCHER STRL LF (DRAPE) ×2 IMPLANT
BLADE SURG 15 STRL LF DISP TIS (BLADE) IMPLANT
BLADE SURG 15 STRL SS (BLADE)
CATH AINSWORTH 30CC 24FR (CATHETERS) IMPLANT
CATH FOLEY 3WAY 30CC 24FR (CATHETERS)
CATH HEMA 3WAY 30CC 24FR COUDE (CATHETERS) ×1 IMPLANT
CATH URO 16X24FR 3W FL PS (CATHETERS) IMPLANT
CLOTH BEACON ORANGE TIMEOUT ST (SAFETY) ×2 IMPLANT
DRAPE CAMERA CLOSED 9X96 (DRAPES) ×2 IMPLANT
ELECT HF RESECT BIPO 24F 45 ND (CUTTING LOOP) ×3 IMPLANT
ELECT LOOP MED HF 24F 12D (CUTTING LOOP) ×2 IMPLANT
ELECT REM PT RETURN 9FT ADLT (ELECTROSURGICAL) ×2
ELECTRODE REM PT RTRN 9FT ADLT (ELECTROSURGICAL) ×1 IMPLANT
GLOVE BIOGEL M STRL SZ7.5 (GLOVE) ×2 IMPLANT
GOWN PREVENTION PLUS LG XLONG (DISPOSABLE) ×2 IMPLANT
GOWN STRL REIN XL XLG (GOWN DISPOSABLE) ×2 IMPLANT
HOLDER FOLEY CATH W/STRAP (MISCELLANEOUS) IMPLANT
KIT ASPIRATION TUBING (SET/KITS/TRAYS/PACK) ×2 IMPLANT
KIT SUPRAPUBIC CATH (MISCELLANEOUS) IMPLANT
MANIFOLD NEPTUNE II (INSTRUMENTS) ×2 IMPLANT
MARKER SKIN DUAL TIP RULER LAB (MISCELLANEOUS) ×2 IMPLANT
NDL SPNL 18GX3.5 QUINCKE PK (NEEDLE) IMPLANT
NEEDLE SPNL 18GX3.5 QUINCKE PK (NEEDLE) IMPLANT
NS IRRIG 1000ML POUR BTL (IV SOLUTION) ×2 IMPLANT
PACK CYSTO (CUSTOM PROCEDURE TRAY) ×2 IMPLANT
SCRUB PCMX 4 OZ (MISCELLANEOUS) ×2 IMPLANT
SUT ETHILON 3 0 PS 1 (SUTURE) IMPLANT
SYR 30ML LL (SYRINGE) ×1 IMPLANT
SYRINGE IRR TOOMEY STRL 70CC (SYRINGE) ×2 IMPLANT
TUBING CONNECTING 10 (TUBING) ×2 IMPLANT

## 2013-03-12 NOTE — Anesthesia Preprocedure Evaluation (Signed)
Anesthesia Evaluation  Patient identified by MRN, date of birth, ID band Patient awake    Reviewed: Allergy & Precautions, H&P , NPO status , Patient's Chart, lab work & pertinent test results  Airway Mallampati: II TM Distance: >3 FB Neck ROM: Full    Dental no notable dental hx.    Pulmonary neg pulmonary ROS,  breath sounds clear to auscultation  Pulmonary exam normal       Cardiovascular hypertension, Pt. on medications + dysrhythmias Atrial Fibrillation Rhythm:Regular Rate:Normal     Neuro/Psych negative neurological ROS  negative psych ROS   GI/Hepatic negative GI ROS, Neg liver ROS,   Endo/Other  negative endocrine ROS  Renal/GU negative Renal ROS  negative genitourinary   Musculoskeletal negative musculoskeletal ROS (+)   Abdominal   Peds negative pediatric ROS (+)  Hematology negative hematology ROS (+)   Anesthesia Other Findings   Reproductive/Obstetrics negative OB ROS                           Anesthesia Physical Anesthesia Plan  ASA: II  Anesthesia Plan: General   Post-op Pain Management:    Induction: Intravenous  Airway Management Planned: LMA  Additional Equipment:   Intra-op Plan:   Post-operative Plan:   Informed Consent: I have reviewed the patients History and Physical, chart, labs and discussed the procedure including the risks, benefits and alternatives for the proposed anesthesia with the patient or authorized representative who has indicated his/her understanding and acceptance.   Dental advisory given  Plan Discussed with: CRNA  Anesthesia Plan Comments:         Anesthesia Quick Evaluation

## 2013-03-12 NOTE — H&P (Signed)
Chief Complaint  cc: Dr. Janece Canterbury   Reason For Visit  1 1/2 year f/u   Active Problems Problems  1. Benign Prostatic Hypertrophy 600.00 2. Neurogenic Bladder 596.54 3. Nocturia 788.43 4. Urge Incontinence Of Urine 788.31  History of Present Illness       Mr. Quiroa is a 79 year old male returns today for 1 1/2 year f/u.  He is s/p Prolieve procedure on 08/14/12.  He states that his urination has improved during the day, but continues to have nocturia x 3 or sometimes every hour.  He complains a severely weak & intermittant flow only at night.  He has some sudden urgency 1st thing of the morning, but has been afraid to take the Vesicare due to side effects listed (Hx of stomach bloating and dry mouth).   IPSS has changed from 5/ 7 post op to 32/7 today.  Current PSA on 07/17/10 was 0.68.        Pt has a history of severe sensory urgency, and has failed Oxytrol patches.  The patient did better when he was using a combination of Sanctura and Oxytrol patches, but could not afford the medications.  Other medications have included Saw Palmetto and multivitamins.The patient initially Flomax therapy.Note that the patient is status post TUNA therapy in 2000 for BPH. He voided well after this, but approximately one year ago, he developed sensory urgency and nocturia treated with anticholinergics.  He reduced his caffeine, but is completely "miserable" because of  sensory urgency.  Note also that he has a history of urodynamics, showing a small capacity bladder of 133 cc, as well as sensory urgencies.  Biopsies have been negative for prostatic cancer in the past.  His prostate size is 40 cc.IPSS 18/7. Off Oxytrol patches, because it didn't work. Stopped Rapiflo because it caused nausea. He stopped Gelnique because of diarrhea, and skin rash on his neck. Went back to patches. Nocturia q2 hours. Has failed combination Sanctura/Oxytrol in the past. Also has vision problems. Have discussed with Botox with  husband and wife. She is very opposed to Botox. On Flomax each day. PSA 0.8. He has taken  Avodart for 6 months. IPSS=22. He returns after Avodart trial, and has been considered for PTNS trial. IPSS currently 18. He is on Flomax, but stopped Avodart because he didn't see any difference.    Past Medical History Problems  1. History of  Arthritis V13.4 2. History of  Cardiac Catheter His Ablation 3. History of  Hyperactivity Of The Bladder 596.51 4. History of  Tachycardia 785.0 5. History of  Urinary Frequency  Surgical History Problems  1. History of  Cataract Surgery 2. History of  Knee Replacement 3. History of  Surg Prostate Transureth Dest Tissue Radiofreq Thermotherapy 4. History of  Umbilical Hernia Repair 5. Umbilical Hernia Repair  Current Meds 1. Amiodarone HCl 200 MG Oral Tablet; 1BID; Therapy: (Recorded:23Jan2012) to 2. Chromium Picolinate 200 MCG Oral Capsule; Therapy: (Recorded:02Oct2012) to 3. Coumadin 5 MG Oral Tablet; Therapy: 23Jul2010 to 4. Fish Oil CAPS; 1000mg  1 qd; Therapy: (Recorded:23Jan2012) to 5. Flaxseed Oil CAPS; 1000mg  1qd; Therapy: (Recorded:23Jan2012) to 6. ICaps MV Oral Tablet; TAKE 1 TABLET DAILY; Therapy: (Recorded:23Jan2012) to 7. Mega Multi Men Oral Tablet Extended Release; TAKE 1 TABLET DAILY; Therapy:  (Recorded:23Jan2012) to 8. Oscal 500/200 D-3 TABS; 1qd; Therapy: (Recorded:23Jan2012) to 9. Probiotic CAPS; Therapy: (Recorded:18Aug2014) to 10. Tamsulosin HCl 0.4 MG Oral Capsule; TAKE (1) CAPSULE ONCE    DAILY; Therapy: 21Jul2010 to   (Evaluate:19Oct2010); Last  Rx:21Jul2010 11. Xalatan 0.005 % Ophthalmic Solution; INSTILL 1 DROP IN BOTH EYES AT BEDTIME; Therapy:   (Recorded:23Jan2012) to  Allergies Medication  1. Gelnique GEL 2. Rapaflo CAPS 3. Penicillins  Family History Problems  1. Family history of  Family Health Status Number Of Children 1 daughter  Social History Problems  1. Activities Of Daily Living 2. [facility] Habits [facility] 3. Living Independently With Spouse 4. Marital History - Currently Married 5. Never A Smoker 6. Occupation: Designer, fashion/clothing 7. Self-reliant In Usual Daily Activities Denied  8. Alcohol Use 9. Tobacco Use V15.82  Review of Systems Genitourinary, constitutional, skin, eye, otolaryngeal, hematologic/lymphatic, cardiovascular, pulmonary, endocrine, musculoskeletal, gastrointestinal, neurological and psychiatric system(s) were reviewed and pertinent findings if present are noted.  Genitourinary: urinary frequency, feelings of urinary urgency, nocturia, weak urinary stream, urinary stream starts and stops, incomplete emptying of bladder and initiating urination requires straining.  Gastrointestinal: constipation.  Constitutional: feeling tired (fatigue).  ENT: sinus problems.  Hematologic/Lymphatic: a tendency to easily bruise.  Respiratory: shortness of breath and cough.    Vitals Vital Signs [Data Includes: Last 1 Day]  18Aug2014 01:04PM  Blood Pressure: 77 / 45 Temperature: 97.9 F Heart Rate: 82  Physical Exam Constitutional: Well nourished and well developed . No acute distress.  Abdomen: The abdomen is soft and nontender. No masses are palpated. No CVA tenderness. No hernias are palpable. No hepatosplenomegaly noted.  Rectal: Rectal exam demonstrates normal sphincter tone, no tenderness and no masses. Estimated prostate size is 4+. The prostate has no nodularity, is not indurated and is not tender. The left seminal vesicle is nonpalpable. The right seminal vesicle is nonpalpable. The perineum is normal on inspection.  Genitourinary: Examination of the penis demonstrates no discharge, no masses, no lesions and a normal meatus. The scrotum is without lesions. The right epididymis is palpably normal and non-tender. The left epididymis is palpably normal and non-tender. The right testis is non-tender and without masses. The left testis is  non-tender and without masses.    Results/Data Urine [Data Includes: Last 1 Day]   18Aug2014  COLOR YELLOW   APPEARANCE CLEAR   SPECIFIC GRAVITY >1.030   pH 6.0   GLUCOSE NEG mg/dL  BILIRUBIN NEG   KETONE TRACE mg/dL  BLOOD NEG   PROTEIN NEG mg/dL  UROBILINOGEN 0.2 mg/dL  NITRITE NEG   LEUKOCYTE ESTERASE NEG    Flow Rate: Voided 85 ml. A peak flow rate of 71ml/s and mean flow rate of 78ml/s.  PVR: Ultrasound PVR 14 ml.  IPSS: The IPSS today is 32  QOL score is 5    Procedure  Procedure: Cystoscopy  Chaperone Present: Brandy May, CMA.  Indication: Lower Urinary Tract Symptoms.  Informed Consent: Risks, benefits, and potential adverse events were discussed and informed consent was obtained from the patient.  Prep: The patient was prepped with betadine.  Anesthesia:. Local anesthesia was administered intraurethrally with 2% lidocaine jelly.  Antibiotic prophylaxis: Ciprofloxacin.  Procedure Note:  Urethral meatus:. No abnormalities.  Anterior urethra: No abnormalities.  Prostatic urethra:. The lateral and median prostatic lobes were enlarged. An enlarged intravesical median lobe was visualized.  Bladder: Visulization was clear. A systematic survey of the bladder demonstrated no bladder tumors or stones. The mucosa was smooth without abnormalities. Examination of the bladder demonstrated no clot within the bladder, no trabeculation and no diverticulum no erythematous mucosa, no ulcer and no cellules. The patient tolerated the procedure well.  Complications: None.  Assessment Assessed  1. Benign Prostatic Hypertrophy 600.00   ( 45 minutes)[place] BPH symptoms. He has a typical microwave prostate opening, but he has significant symptoms. he has poor flow rate wit peak of 6cc/sec, but is able to void to completion. with pvr=14cc. IPSS=32 is of concern, and I haved advised him to have TURP with anesthesia. I have addressed all his wife's concerns regarding pain ( pain with  microwave), and why we chose ,icrowave instead of TURP in the past ( concern for age and anesthesia, etc). However, he now will ned to have anesthesia, TURP with concerns for bleeding, and complications, and concerns for post op voiding problems, like incontinence.   I have explained to Mrs Sliwa that  I understand that she loves her husband very much, and that I will do my best to take as good a care of him that I can. If she would like me to send him to one of my partners or to another proactice, I would be willing to, and that would be fine-but they would like to stay with my service.   Plan Benign Prostatic Hypertrophy (600.00)  1. Complex Uroflowmetry  Done: 18Aug2014 2. Cysto  Done: 18Aug2014 3. PVR U/S  Done: 18Aug2014 Health Maintenance (V70.0)  4. UA With REFLEX  Done: 18Aug2014 12:46PM   Schedule TURP.   Signatures Electronically signed by : Jethro Bolus, M.D.; Feb 09 2013  3:07PM

## 2013-03-12 NOTE — Care Management Note (Addendum)
    Page 1 of 1   03/13/2013     12:30:32 PM   CARE MANAGEMENT NOTE 03/13/2013  Patient:  [name], [name]   Account Number:  [id number]  Date Initiated:  03/12/2013  Documentation initiated by:  Lanier Clam  Subjective/Objective Assessment:   78 Y/O M ADMITTED W/BLADDER OUTLET OBSTRUCTION,BPH.     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   Anticipated DC Date:  03/13/2013   Anticipated DC Plan:  HOME/SELF CARE      DC Planning Services  CM consult      Choice offered to / List presented to:             Status of service:  In process, will continue to follow Medicare Important Message given?   (If response is "NO", the following Medicare IM given date fields will be blank) Date Medicare IM given:   Date Additional Medicare IM given:    Discharge Disposition:    Per UR Regulation:  Reviewed for med. necessity/level of care/duration of stay  If discussed at Long Length of Stay Meetings, dates discussed:    Comments:  03/13/13 Aricka Goldberger RN,BSN NCM [phone] WILL ALSO LEAVE A HHC AGENCY LIST IN RM IF HOME HEALTH IS NEEDED FOR PATIENT. PROVIDED SPOUSE W/PRIVATE SITTER LIST AS RESOURCE FOR SPOUSE IF NEEDED.INFORMED HIM THAT THIS IS AN OUT OF POCKET EXPENSE.PATIENT VOICED UNDERSTANDING.HE STATED THAT HIS DTR PROVIDES ASST @ HOME.  03/11/13 Burhan Barham RN,BSN NCM [phone] S/P TURP.

## 2013-03-12 NOTE — Transfer of Care (Signed)
Immediate Anesthesia Transfer of Care Note  Patient: [name]  Procedure(s) Performed: Procedure(s): TRANSURETHRAL PROSTATECTOMY WITH GYRUS INSTRUMENTS (N/A)  Patient Location: PACU  Anesthesia Type:General  Level of Consciousness: awake and alert   Airway & Oxygen Therapy: Patient Spontanous Breathing and Patient connected to face mask oxygen  Post-op Assessment: Report given to PACU RN and Post -op Vital signs reviewed and stable  Post vital signs: Reviewed and stable  Complications: No apparent anesthesia complications

## 2013-03-12 NOTE — Anesthesia Postprocedure Evaluation (Signed)
  Anesthesia Post-op Note  Patient: [name]  Procedure(s) Performed: Procedure(s) (LRB): TRANSURETHRAL PROSTATECTOMY WITH GYRUS INSTRUMENTS (N/A)  Patient Location: PACU  Anesthesia Type: General  Level of Consciousness: awake and alert   Airway and Oxygen Therapy: Patient Spontanous Breathing  Post-op Pain: mild  Post-op Assessment: Post-op Vital signs reviewed, Patient's Cardiovascular Status Stable, Respiratory Function Stable, Patent Airway and No signs of Nausea or vomiting  Last Vitals:  Filed Vitals:   03/12/13 1458  BP: 143/69  Pulse: 46  Temp: 36.7 C  Resp: 16    Post-op Vital Signs: stable   Complications: No apparent anesthesia complications

## 2013-03-12 NOTE — Interval H&P Note (Signed)
History and Physical Interval Note:  03/12/2013 8:10 AM  [name]  has presented today for surgery, with the diagnosis of BPH  The various methods of treatment have been discussed with the patient and family. After consideration of risks, benefits and other options for treatment, the patient has consented to  Procedure(s): TRANSURETHRAL PROSTATECTOMY WITH GYRUS INSTRUMENTS (N/A) as a surgical intervention .  The patient's history has been reviewed, patient examined, no change in status, stable for surgery.  I have reviewed the patient's chart and labs.  Questions were answered to the patient's satisfaction.     [name] I

## 2013-03-12 NOTE — Op Note (Signed)
Pre-operative diagnosis :   Bladder outlet obstruction secondary to BPH  Postoperative diagnosis:  Same  Operation:  Cystourethroscopy, gyrus TURP  Surgeon:  S. Patsi Sears, MD  First assistant:  None  Anesthesia:  General  LMA  Preparation:  After appropriate preanesthesia, the patient was brought to the operating room, placed on the operating table in the dorsal supine position where general LMA anesthesia was introduced. He was then replaced in the dorsal lithotomy position with pubis was prepped with Betadine solution and draped in usual fashion. Armband was double checked. History was double checked.  Review history:   of Present Illness  [name] is a 79 year old male returns today for 1 1/2 year f/u. He is s/p Prolieve procedure on 08/14/12. He states that his urination has improved during the day, but continues to have nocturia x 3 or sometimes every hour. He complains a severely weak & intermittant flow only at night. He has some sudden urgency 1st thing of the morning, but has been afraid to take the Vesicare due to side effects listed (Hx of stomach bloating and dry mouth). IPSS has changed from 5/ 7 post op to 32/7 today. Current PSA on 07/17/10 was 0.68.  Pt has a history of severe sensory urgency, and has failed Oxytrol patches. The patient did better when he was using a combination of Sanctura and Oxytrol patches, but could not afford the medications. Other medications have included Saw Palmetto and multivitamins.The patient initially Flomax therapy.Note that the patient is status post TUNA therapy in 2000 for BPH. He voided well after this, but approximately one year ago, he developed sensory urgency and nocturia treated with anticholinergics. He reduced his caffeine, but is completely "miserable" because of sensory urgency. Note also that he has a history of urodynamics, showing a small capacity bladder of 133 cc, as well as sensory urgencies. Biopsies have been negative for  prostatic cancer in the past. His prostate size is 40 cc.IPSS 18/7. Off Oxytrol patches, because it didn't work. Stopped Rapiflo because it caused nausea. He stopped Gelnique because of diarrhea, and skin rash on his neck. Went back to patches. Nocturia q2 hours. Has failed combination Sanctura/Oxytrol in the past. Also has vision problems. Have discussed with Botox with husband and wife. She is very opposed to Botox. On Flomax each day. PSA 0.8. He has taken Avodart for 6 months. IPSS=22. He returns after Avodart trial, and has been considered for PTNS trial. IPSS currently 18. He is on Flomax, but stopped Avodart because he didn't see any difference.      Statement of  Likelihood of Success: Excellent. TIME-OUT observed.:  Procedure:  Cystourethroscopy was accomplished, shows normal distal urethral and normal meatus and a circumcised male. The proximal urethra showed overgrowth of the prostate, particularly on the left side, with large left lateral prostate crossing the midline. The patient's bladder neck was opened, secondary to previous microwave thermotherapy. Trabeculation was noted within the bladder with clear reflux, and from both ureteral orifice these. There was no evidence of bladder stone, tumor, or diverticular formation.  Resection was accomplished from the 5:00 position to the 1:00 position, and from the 7:00 to the 5:00 position, and from the 11:00 down to the 7:00 position. Extensive cauterization was accomplished. With the patient was noted to be quite bloody, requiring extensive the use of the cautery unit. It is noted that he has been on Coumadin for atrial fibrillation, but was stopped appropriately preoperatively.  Following cauterization of bleeding points, the chips were  evacuated from the bladder, and sent to laboratory for examination. The evacuation of fluid appeared to be pink, with no clots. A size 24 three-way hematuria catheter was placed with traction and continuous  irrigation. The patient was awakened and taken to recovery room in excellent condition. He received a B. and O. suppository.

## 2013-03-13 ENCOUNTER — Encounter (HOSPITAL_COMMUNITY): Payer: Self-pay | Admitting: Urology

## 2013-03-13 LAB — HEMOGLOBIN AND HEMATOCRIT, BLOOD
HCT: 44 % (ref 39.0–52.0)
Hemoglobin: 15.1 g/dL (ref 13.0–17.0)

## 2013-03-13 LAB — BASIC METABOLIC PANEL
Calcium: 8.4 mg/dL (ref 8.4–10.5)
GFR calc Af Amer: 78 mL/min — ABNORMAL LOW (ref >90)
GFR calc non Af Amer: 67 mL/min — ABNORMAL LOW (ref >90)
Sodium: 139 mEq/L (ref 135–145)

## 2013-03-13 LAB — PROTIME-INR
INR: 1.12 (ref 0.00–1.49)
Prothrombin Time: 14.2 seconds (ref 11.6–15.2)

## 2013-03-13 MED ORDER — TRAMADOL-ACETAMINOPHEN 37.5-325 MG PO TABS: 1.0000 | ORAL_TABLET | Freq: Four times a day (QID) | ORAL | Status: DC | PRN

## 2013-03-13 MED ORDER — DOXYCYCLINE HYCLATE 50 MG PO CAPS: 100.0000 mg | ORAL_CAPSULE | Freq: Two times a day (BID) | ORAL | Status: DC

## 2013-03-13 NOTE — Discharge Summary (Signed)
  Physician Discharge Summary  Patient ID: [name] MRN: [id number] DOB/AGE: [birth year] 79 y.o.  Admit date: 03/12/2013 Discharge date: 03/13/2013  Admission Diagnoses: BPH  Discharge Diagnoses:  BPH  Discharged Condition: stable  Hospital Course:   Stabl;e post TURP  Consults: none  Significant Diagnostic Studies: No results found.  Treatments:  surgery  Discharge Exam: Blood pressure 141/68, pulse 86, temperature 97.8 F (36.6 C), temperature source Oral, resp. rate 18, height 5\' 10"  (1.778 m), weight 97.8 kg (215 lb 9.8 oz), SpO2 92.00%. General appearance: alert and cooperative Male genitalia: normal  Disposition:    Future Appointments Provider Department Dept Phone   03/20/2013 1:10 PM [facility] (near [place]) [phone]       Medication List    ASK your doctor about these medications       amiodarone 200 MG tablet  Commonly known as:  PACERONE  Take 200 mg by mouth every morning.     calcium-vitamin D 500-200 MG-UNIT per tablet  Commonly known as:  OSCAL WITH D  Take 1 tablet by mouth daily with breakfast.     Chromium Picolinate 200 MCG Tabs  Take 1 tablet by mouth daily.     Fish Oil 300 MG Caps  Take 1 capsule by mouth daily.     Flaxseed Oil 1000 MG Caps  Take 1 capsule by mouth daily.     ICAPS LUTEIN-ZEAXANTHIN PO  Take 1 tablet by mouth daily.     latanoprost 0.005 % ophthalmic solution  Commonly known as:  XALATAN  Place 1 drop into both eyes at bedtime.     multivitamin with minerals Tabs tablet  Take 1 tablet by mouth daily.     PROBIOTIC Caps  Take 1 capsule by mouth daily.     tamsulosin 0.4 MG Caps capsule  Commonly known as:  FLOMAX  Take 0.4 capsules by mouth every evening.     UDAMIN SP 1 MG Tabs  Take 1 tablet by mouth daily.     warfarin 5 MG tablet  Commonly known as:  COUMADIN  Take 0.5-1 tablets (2.5-5 mg total) by mouth daily. Takes 1/2 tablet Monday and Friday and 1  tablet the rest of the week           Follow-up Information   Follow up with Jethro Bolus I, MD. (per appointment)    Specialty:  Urology   Contact information:   [address] [place] [address] [phone]       Signed: Jethro Bolus I 03/13/2013, 7:48 AM

## 2013-03-13 NOTE — Progress Notes (Signed)
1 Day Post-Op Subjective: Patient reports awake and alert. No pain. Urine pink, without clots. Pt desires to stay another night to have foley removed in AM.   Objective: Vital signs in last 24 hours: Temp:  [97.8 F (36.6 C)-98.3 F (36.8 C)] 97.8 F (36.6 C) (09/19 0500) Pulse Rate:  [46-86] 86 (09/19 0500) Resp:  [10-18] 18 (09/19 0500) BP: (122-178)/(61-76) 141/68 mmHg (09/19 0500) SpO2:  [92 %-98 %] 92 % (09/19 0500) Weight:  [97.8 kg (215 lb 9.8 oz)] 97.8 kg (215 lb 9.8 oz) (09/18 1148)  Intake/Output from previous day: 09/18 0701 - 09/19 0700 In: 9291.7 [P.O.:600; I.V.:2091.7] Out: 9450 [Urine:9450] Intake/Output this shift:    Physical Exam:  General:alert and cooperative GI: not done and soft, non tender, normal bowel sounds, no palpable masses, no organomegaly, no inguinal hernia Male genitalia: not done no bladder distension noted Resp: clear to auscultation bilaterally  Lab Results:  Recent Labs  03/12/13 1220 03/13/13 0445  HGB 15.4 15.1  HCT 45.2 44.0   BMET  Recent Labs  03/12/13 1220 03/13/13 0445  NA  --  139  K  --  3.5  CL  --  104  CO2  --  26  GLUCOSE  --  108*  BUN  --  13  CREATININE 1.07 1.05  CALCIUM  --  8.4    Recent Labs  03/12/13 0730 03/13/13 0445  INR 1.06 1.12   No results found for this basename: LABURIN,  in the last 72 hours No results found for this or any previous visit.  Studies/Results: No results found.  Assessment/Plan: Continue foley post TURP D/c foley at 6 AM Sat and voiding trial, and plan for D/c. Sat.    LOS: 1 day   Dusty Wagoner I 03/13/2013, 7:41 AM

## 2013-03-14 MED ORDER — WARFARIN SODIUM 5 MG PO TABS: 2.5000 mg | ORAL_TABLET | Freq: Every day | ORAL | Status: DC

## 2013-03-14 NOTE — Progress Notes (Signed)
Pt educated about new medications and when to follow up with primary and urology. Pt and wife seemed confused this am so I reiterated discharge instructions with daughter, Efraim Kaufmann.

## 2013-03-14 NOTE — Progress Notes (Signed)
Foley cath removed, pt tolerated fair. Medicated for discomfort. Pt reminded to void in the bottles in his bathroom. Pt voiced understanding.

## 2013-03-14 NOTE — Progress Notes (Signed)
POD2 s/p TURP  Intv: Catheter is out, has passed his voiding.  Urine gettting lighter with each void. No other issues/complaints.  Exam Filed Vitals:   03/14/13 0610  BP: 164/85  Pulse: 77  Temp: 98.5 F (36.9 C)  Resp: 16    Intake/Output Summary (Last 24 hours) at 03/14/13 0830 Last data filed at 03/14/13 0700  Gross per 24 hour  Intake 1251.67 ml  Output   1700 ml  Net -448.33 ml   Abdomen is soft  No labs today  Imp: POD#2 s/p TURP ready for discharge Plan: Home this AM, will arrange for follow with Dr. Patsi Sears.  Have advised patient not to resume his coumadin for at least 7 days.

## 2013-03-27 ENCOUNTER — Ambulatory Visit (INDEPENDENT_AMBULATORY_CARE_PROVIDER_SITE_OTHER): Payer: Medicare Other | Admitting: *Deleted

## 2013-03-27 DIAGNOSIS — Z7901 Long term (current) use of anticoagulants: Secondary | ICD-10-CM

## 2013-03-27 DIAGNOSIS — I4892 Unspecified atrial flutter: Secondary | ICD-10-CM

## 2013-04-17 ENCOUNTER — Ambulatory Visit (INDEPENDENT_AMBULATORY_CARE_PROVIDER_SITE_OTHER): Payer: Medicare Other | Admitting: *Deleted

## 2013-04-17 DIAGNOSIS — I4892 Unspecified atrial flutter: Secondary | ICD-10-CM

## 2013-04-17 DIAGNOSIS — Z7901 Long term (current) use of anticoagulants: Secondary | ICD-10-CM

## 2013-04-17 LAB — POCT INR: INR: 2.5

## 2013-05-15 ENCOUNTER — Ambulatory Visit (INDEPENDENT_AMBULATORY_CARE_PROVIDER_SITE_OTHER): Payer: Medicare Other | Admitting: *Deleted

## 2013-05-15 DIAGNOSIS — I4892 Unspecified atrial flutter: Secondary | ICD-10-CM

## 2013-05-15 DIAGNOSIS — Z7901 Long term (current) use of anticoagulants: Secondary | ICD-10-CM

## 2013-05-15 LAB — POCT INR: INR: 3.1

## 2013-05-22 ENCOUNTER — Other Ambulatory Visit: Payer: Self-pay | Admitting: *Deleted

## 2013-05-22 MED ORDER — AMIODARONE HCL 200 MG PO TABS: 200.0000 mg | ORAL_TABLET | Freq: Every morning | ORAL | Status: DC

## 2013-06-02 ENCOUNTER — Ambulatory Visit (INDEPENDENT_AMBULATORY_CARE_PROVIDER_SITE_OTHER): Payer: Medicare Other | Admitting: Cardiovascular Disease

## 2013-06-02 ENCOUNTER — Encounter: Payer: Self-pay | Admitting: Cardiovascular Disease

## 2013-06-02 ENCOUNTER — Ambulatory Visit (INDEPENDENT_AMBULATORY_CARE_PROVIDER_SITE_OTHER): Payer: Medicare Other | Admitting: *Deleted

## 2013-06-02 VITALS — BP 131/88 | HR 72 | Ht 70.0 in | Wt 206.0 lb

## 2013-06-02 DIAGNOSIS — I4892 Unspecified atrial flutter: Secondary | ICD-10-CM

## 2013-06-02 DIAGNOSIS — I429 Cardiomyopathy, unspecified: Secondary | ICD-10-CM

## 2013-06-02 DIAGNOSIS — I10 Essential (primary) hypertension: Secondary | ICD-10-CM

## 2013-06-02 DIAGNOSIS — Z79899 Other long term (current) drug therapy: Secondary | ICD-10-CM

## 2013-06-02 DIAGNOSIS — R0602 Shortness of breath: Secondary | ICD-10-CM

## 2013-06-02 DIAGNOSIS — R942 Abnormal results of pulmonary function studies: Secondary | ICD-10-CM

## 2013-06-02 DIAGNOSIS — Z7901 Long term (current) use of anticoagulants: Secondary | ICD-10-CM

## 2013-06-02 NOTE — Patient Instructions (Signed)
Your physician recommends that you schedule a follow-up appointment in: 6 month with Dr. Purvis Sheffield. You should receive a letter in the mail in 4 months. If you do not receive this letter by April 2015 call our office to schedule this appointment.   Your physician recommends that you continue on your current medications as directed. Please refer to the Current Medication list given to you today.

## 2013-06-02 NOTE — Progress Notes (Signed)
Patient ID: [name], male   DOB: [birth year], 79 y.o.   MRN: [id number]      SUBJECTIVE: The patient is doing well and denies chest pain, palpitations, lightheadedness, dizziness, leg swelling and syncope. His shortness of breath is chronic and does not bother him. It has not progressed in intensity. I recommended he follow up with pulmonary after receiving the results of his pulmonary function testing, but he did not follow through with this. PFT's in July showed a minimal obstructive lung defect with a severe decrease in diffusing capacity.    Allergies  Allergen Reactions  . Sulfonamide Derivatives     unknown  . Penicillins Rash    unknown    Current Outpatient Prescriptions  Medication Sig Dispense Refill  . amiodarone (PACERONE) 200 MG tablet Take 1 tablet (200 mg total) by mouth every morning.  30 tablet  6  . calcium-vitamin D (OSCAL WITH D) 500-200 MG-UNIT per tablet Take 1 tablet by mouth daily with breakfast.      . Chromium Picolinate 200 MCG TABS Take 1 tablet by mouth daily.      . Flaxseed, Linseed, (FLAXSEED OIL) 1000 MG CAPS Take 1 capsule by mouth daily.        [name] latanoprost (XALATAN) 0.005 % ophthalmic solution Place 1 drop into both eyes at bedtime.      . Multiple Vitamin (MULTIVITAMIN WITH MINERALS) TABS tablet Take 1 tablet by mouth daily.      . Omega-3 Fatty Acids (FISH OIL) 1000 MG CAPS Take 1 capsule by mouth daily.      [name] PROBIOTIC CAPS Take 1 capsule by mouth daily.      [name] Specialty Vitamins Products (ICAPS LUTEIN-ZEAXANTHIN PO) Take 1 tablet by mouth daily.        . traMADol-acetaminophen (ULTRACET) 37.5-325 MG per tablet Take 1 tablet by mouth every 6 (six) hours as needed for pain.  30 tablet  2  . warfarin (COUMADIN) 5 MG tablet Take 0.5-1 tablets (2.5-5 mg total) by mouth daily. Takes 1/2 tablet Monday and Friday and 1 tablet the rest of the week Resume in 7 days after surgery or once blood in urine has resolved.  30 tablet  3   No current  facility-administered medications for this visit.    Past Medical History  Diagnosis Date  . Cardiomyopathy     a. Prior EF 30-35%, felt tachycardia induced cardiomyopathyy. b. Improved after cardioversion and restoration of normal sinus rhythm - EF 65-70% by echo 10/13  . Essential hypertension, benign   . Warfarin anticoagulation     For atrial flutter  . H/O amiodarone therapy     Used for atrial flutter after return of flutter after ablation  . Atrial flutter, paroxysmal     a. Hx of TEE with LAA clots. b. s/p ESP/RFCA of atrial flutter 08/2008. b. Recurrence of A-flutter after ablation/atypical, requiring DCCV and amiodaorone.  [name] BPH (benign prostatic hypertrophy)   . Urination, excessive at night   . Shortness of breath for last year    when pt lies down     Past Surgical History  Procedure Laterality Date  . Femoral hernia repair      x2  . Turp vaporization    . Total knee arthroplasty Right 2004  . Umbilical hernia repair    . Transurethral prostatectomy with gyrus instruments N/A 03/12/2013    Procedure: TRANSURETHRAL PROSTATECTOMY WITH GYRUS INSTRUMENTS;  Surgeon: Kathi Ludwig, MD;  Location: [facility];  Service:  Urology;  Laterality: N/A;    History   Social History  . Marital Status: Married    Spouse Name: N/A    Number of Children: N/A  . Years of Education: N/A   Occupational History  . Not on file.   Social History Main Topics  . Smoking status: Never Smoker   . Smokeless tobacco: Never Used     Comment: tobacco use- no  . Alcohol Use: No  . Drug Use: No  . Sexual Activity: Not on file   Other Topics Concern  . Not on file   Social History Narrative   Retired, married.      Filed Vitals:   06/02/13 1111  BP: 131/88  Pulse: 72  Height: 5\' 10"  (1.778 m)  Weight: 206 lb (93.441 kg)    PHYSICAL EXAM General: NAD Neck: No JVD, no thyromegaly or thyroid nodule.  Lungs: Clear to auscultation bilaterally with normal respiratory  effort. CV: Nondisplaced PMI.  Heart regular S1/S2, no S3/S4, no murmur.  No peripheral edema.  No carotid bruit.  Normal pedal pulses.  Abdomen: Soft, nontender, no hepatosplenomegaly, no distention.  Neurologic: Alert and oriented x 3.  Psych: Normal affect. Extremities: No clubbing or cyanosis.   ECG: reviewed and available in electronic records.      ASSESSMENT AND PLAN: 1. Shortness of breath  This is stable and chronic. He tried an inhaler given to him by his PCP and while he felt it helped to some degree, he never obtained refills. He deferred on  pulmonary consultation.   2. ATRIAL FLUTTER, PAROXYSMAL  Quiescent at this time, plan to continue Coumadin and amiodarone.    3. Secondary cardiomyopathy, unspecified  Tachycardia mediated with improvement to normal function, most recent LVEF 65-70%.   4. Essential hypertension, benign  Blood pressure is normal today.  Dispo: f/u 6 months.   Prentice Docker, M.D., F.A.C.C.

## 2013-06-30 ENCOUNTER — Ambulatory Visit (INDEPENDENT_AMBULATORY_CARE_PROVIDER_SITE_OTHER): Payer: Medicare Other | Admitting: *Deleted

## 2013-06-30 DIAGNOSIS — Z7901 Long term (current) use of anticoagulants: Secondary | ICD-10-CM

## 2013-06-30 DIAGNOSIS — I4892 Unspecified atrial flutter: Secondary | ICD-10-CM

## 2013-06-30 LAB — POCT INR: INR: 4.2

## 2013-07-21 ENCOUNTER — Ambulatory Visit (INDEPENDENT_AMBULATORY_CARE_PROVIDER_SITE_OTHER): Payer: Medicare Other | Admitting: *Deleted

## 2013-07-21 DIAGNOSIS — I4892 Unspecified atrial flutter: Secondary | ICD-10-CM

## 2013-07-21 DIAGNOSIS — Z7901 Long term (current) use of anticoagulants: Secondary | ICD-10-CM

## 2013-07-21 DIAGNOSIS — Z5181 Encounter for therapeutic drug level monitoring: Secondary | ICD-10-CM | POA: Insufficient documentation

## 2013-07-21 LAB — POCT INR: INR: 2.2

## 2013-08-04 ENCOUNTER — Other Ambulatory Visit: Payer: Self-pay | Admitting: Cardiology

## 2013-08-21 ENCOUNTER — Ambulatory Visit (INDEPENDENT_AMBULATORY_CARE_PROVIDER_SITE_OTHER): Payer: Medicare Other | Admitting: *Deleted

## 2013-08-21 DIAGNOSIS — Z7901 Long term (current) use of anticoagulants: Secondary | ICD-10-CM

## 2013-08-21 DIAGNOSIS — I4892 Unspecified atrial flutter: Secondary | ICD-10-CM

## 2013-08-21 DIAGNOSIS — Z5181 Encounter for therapeutic drug level monitoring: Secondary | ICD-10-CM

## 2013-08-21 LAB — POCT INR: INR: 3.7

## 2013-09-11 ENCOUNTER — Ambulatory Visit (INDEPENDENT_AMBULATORY_CARE_PROVIDER_SITE_OTHER): Payer: Medicare Other | Admitting: *Deleted

## 2013-09-11 DIAGNOSIS — I4892 Unspecified atrial flutter: Secondary | ICD-10-CM

## 2013-09-11 DIAGNOSIS — Z5181 Encounter for therapeutic drug level monitoring: Secondary | ICD-10-CM

## 2013-09-11 DIAGNOSIS — Z7901 Long term (current) use of anticoagulants: Secondary | ICD-10-CM

## 2013-09-11 LAB — POCT INR: INR: 3.4

## 2013-10-09 ENCOUNTER — Ambulatory Visit (INDEPENDENT_AMBULATORY_CARE_PROVIDER_SITE_OTHER): Payer: Medicare Other | Admitting: *Deleted

## 2013-10-09 DIAGNOSIS — Z5181 Encounter for therapeutic drug level monitoring: Secondary | ICD-10-CM

## 2013-10-09 DIAGNOSIS — Z7901 Long term (current) use of anticoagulants: Secondary | ICD-10-CM

## 2013-10-09 DIAGNOSIS — I4892 Unspecified atrial flutter: Secondary | ICD-10-CM

## 2013-10-09 LAB — POCT INR: INR: 3.1

## 2013-11-06 ENCOUNTER — Ambulatory Visit (INDEPENDENT_AMBULATORY_CARE_PROVIDER_SITE_OTHER): Payer: Medicare Other | Admitting: *Deleted

## 2013-11-06 DIAGNOSIS — Z7901 Long term (current) use of anticoagulants: Secondary | ICD-10-CM

## 2013-11-06 DIAGNOSIS — I4892 Unspecified atrial flutter: Secondary | ICD-10-CM

## 2013-11-06 DIAGNOSIS — Z5181 Encounter for therapeutic drug level monitoring: Secondary | ICD-10-CM

## 2013-11-06 LAB — POCT INR: INR: 2.7

## 2013-12-04 ENCOUNTER — Ambulatory Visit (INDEPENDENT_AMBULATORY_CARE_PROVIDER_SITE_OTHER): Payer: Medicare Other | Admitting: *Deleted

## 2013-12-04 ENCOUNTER — Encounter: Payer: Self-pay | Admitting: Cardiovascular Disease

## 2013-12-04 ENCOUNTER — Ambulatory Visit (INDEPENDENT_AMBULATORY_CARE_PROVIDER_SITE_OTHER): Payer: Medicare Other | Admitting: Cardiovascular Disease

## 2013-12-04 VITALS — BP 146/87 | HR 60 | Ht 70.0 in | Wt 214.0 lb

## 2013-12-04 DIAGNOSIS — Z5181 Encounter for therapeutic drug level monitoring: Secondary | ICD-10-CM

## 2013-12-04 DIAGNOSIS — I4892 Unspecified atrial flutter: Secondary | ICD-10-CM

## 2013-12-04 DIAGNOSIS — R942 Abnormal results of pulmonary function studies: Secondary | ICD-10-CM

## 2013-12-04 DIAGNOSIS — I4891 Unspecified atrial fibrillation: Secondary | ICD-10-CM

## 2013-12-04 DIAGNOSIS — Z7901 Long term (current) use of anticoagulants: Secondary | ICD-10-CM

## 2013-12-04 DIAGNOSIS — I10 Essential (primary) hypertension: Secondary | ICD-10-CM

## 2013-12-04 DIAGNOSIS — R0602 Shortness of breath: Secondary | ICD-10-CM

## 2013-12-04 DIAGNOSIS — I429 Cardiomyopathy, unspecified: Secondary | ICD-10-CM

## 2013-12-04 LAB — POCT INR: INR: 3.1

## 2013-12-04 NOTE — Progress Notes (Signed)
Patient ID: [name], male   DOB: 12/10/1933, 80 y.o.   MRN: [id number]      SUBJECTIVE: The patient is here to followup for paroxysmal atrial flutter, hypertension, and a cardiomyopathy which has since normalized. ECG today demonstrates atrial fibrillation with a heart rate of 67 beats per minute and a nonspecific ST segment and T wave abnormality. He denies any chest pain. He said he sometimes has shortness of breath toward the evenings but denies orthopnea and paroxysmal nocturnal dyspnea. He denies palpitations. He and his wife like to walk in the cool of the evenings.   Allergies  Allergen Reactions  . Sulfonamide Derivatives     unknown  . Penicillins Rash    unknown    Current Outpatient Prescriptions  Medication Sig Dispense Refill  . amiodarone (PACERONE) 200 MG tablet Take 1 tablet (200 mg total) by mouth every morning.  30 tablet  6  . calcium-vitamin D (OSCAL WITH D) 500-200 MG-UNIT per tablet Take 1 tablet by mouth daily with breakfast.      . Chromium Picolinate 200 MCG TABS Take 1 tablet by mouth daily.      [name] COUMADIN 5 MG tablet TAKE ONE-HALF (1/2) TABLET BY MOUTH ON MONDAYS AND FRIDAYS, TAKE ONE TABLET ON ALL OTHER DAYS.  30 tablet  3  . Flaxseed, Linseed, (FLAXSEED OIL) 1000 MG CAPS Take 1 capsule by mouth daily.        [name] latanoprost (XALATAN) 0.005 % ophthalmic solution Place 1 drop into both eyes at bedtime.      . Multiple Vitamin (MULTIVITAMIN WITH MINERALS) TABS tablet Take 1 tablet by mouth daily.      . Omega-3 Fatty Acids (FISH OIL) 1000 MG CAPS Take 1 capsule by mouth daily.      [name] PROBIOTIC CAPS Take 1 capsule by mouth daily.      [name] Specialty Vitamins Products (ICAPS LUTEIN-ZEAXANTHIN PO) Take 1 tablet by mouth daily.        . traMADol-acetaminophen (ULTRACET) 37.5-325 MG per tablet Take 1 tablet by mouth every 6 (six) hours as needed for pain.  30 tablet  2  . warfarin (COUMADIN) 5 MG tablet Take 0.5-1 tablets (2.5-5 mg total) by mouth daily. Takes  1/2 tablet Monday and Friday and 1 tablet the rest of the week Resume in 7 days after surgery or once blood in urine has resolved.  30 tablet  3   No current facility-administered medications for this visit.    Past Medical History  Diagnosis Date  . Cardiomyopathy     a. Prior EF 30-35%, felt tachycardia induced cardiomyopathyy. b. Improved after cardioversion and restoration of normal sinus rhythm - EF 65-70% by echo 10/13  . Essential hypertension, benign   . Warfarin anticoagulation     For atrial flutter  . H/O amiodarone therapy     Used for atrial flutter after return of flutter after ablation  . Atrial flutter, paroxysmal     a. Hx of TEE with LAA clots. b. s/p ESP/RFCA of atrial flutter 08/2008. b. Recurrence of A-flutter after ablation/atypical, requiring DCCV and amiodaorone.  [name] BPH (benign prostatic hypertrophy)   . Urination, excessive at night   . Shortness of breath for last year    when pt lies down     Past Surgical History  Procedure Laterality Date  . Femoral hernia repair      x2  . Turp vaporization    . Total knee arthroplasty Right 2004  . Umbilical  hernia repair    . Transurethral prostatectomy with gyrus instruments N/A 03/12/2013    Procedure: TRANSURETHRAL PROSTATECTOMY WITH GYRUS INSTRUMENTS;  Surgeon: Ailene Rud, MD;  Location: [facility];  Service: Urology;  Laterality: N/A;    History   Social History  . Marital Status: Married    Spouse Name: N/A    Number of Children: N/A  . Years of Education: N/A   Occupational History  . Not on file.   Social History Main Topics  . Smoking status: Never Smoker   . Smokeless tobacco: Never Used     Comment: tobacco use- no  . Alcohol Use: No  . Drug Use: No  . Sexual Activity: Not on file   Other Topics Concern  . Not on file   Social History Narrative   Retired, married.      Filed Vitals:   12/04/13 1300  Height: 5\' 10"  (1.778 m)   BP 146/87  Pulse 60   PHYSICAL  EXAM General: NAD Neck: No JVD, no thyromegaly. Lungs: Clear to auscultation bilaterally with normal respiratory effort. CV: Nondisplaced PMI.  Irregular rhythm, normal rate, normal S1/S2, no S3, no murmur. No pretibial or periankle edema.   Abdomen: Soft, nontender, no hepatosplenomegaly, no distention.  Neurologic: Alert and oriented x 3.  Psych: Normal affect. Extremities: No clubbing or cyanosis.   ECG: reviewed and available in electronic records.      ASSESSMENT AND PLAN: 1. Shortness of breath  This is stable and chronic. He deferred on pulmonary consultation in the past.   2. ATRIAL FIBRILLATION/FLUTTER, PAROXYSMAL  Rate is well controlled. Continue Coumadin and amiodarone.   3. Secondary cardiomyopathy, unspecified  Tachycardia mediated with improvement to normal function, most recent LVEF 65-70%.   4. Essential hypertension, benign  Blood pressure is mildly elevated today. Will monitor.  Dispo: f/u 6-9 months.   Kate Sable, M.D., F.A.C.C.

## 2013-12-04 NOTE — Patient Instructions (Signed)
Continue all current medications. Your physician wants you to follow up in: 6 months.  You will receive a reminder letter in the mail one-two months in advance.  If you don't receive a letter, please call our office to schedule the follow up appointment   

## 2013-12-17 ENCOUNTER — Other Ambulatory Visit: Payer: Self-pay | Admitting: *Deleted

## 2013-12-17 MED ORDER — AMIODARONE HCL 200 MG PO TABS: 200.0000 mg | ORAL_TABLET | Freq: Every morning | ORAL | Status: DC

## 2013-12-25 ENCOUNTER — Other Ambulatory Visit: Payer: Self-pay | Admitting: Cardiovascular Disease

## 2014-01-01 ENCOUNTER — Ambulatory Visit (INDEPENDENT_AMBULATORY_CARE_PROVIDER_SITE_OTHER): Payer: Medicare Other | Admitting: *Deleted

## 2014-01-01 DIAGNOSIS — Z7901 Long term (current) use of anticoagulants: Secondary | ICD-10-CM

## 2014-01-01 DIAGNOSIS — I484 Atypical atrial flutter: Secondary | ICD-10-CM

## 2014-01-01 DIAGNOSIS — Z5181 Encounter for therapeutic drug level monitoring: Secondary | ICD-10-CM

## 2014-01-01 DIAGNOSIS — I4892 Unspecified atrial flutter: Secondary | ICD-10-CM

## 2014-01-01 LAB — POCT INR: INR: 1.6

## 2014-01-22 ENCOUNTER — Ambulatory Visit (INDEPENDENT_AMBULATORY_CARE_PROVIDER_SITE_OTHER): Payer: Medicare Other | Admitting: *Deleted

## 2014-01-22 DIAGNOSIS — Z7901 Long term (current) use of anticoagulants: Secondary | ICD-10-CM

## 2014-01-22 DIAGNOSIS — Z5181 Encounter for therapeutic drug level monitoring: Secondary | ICD-10-CM

## 2014-01-22 DIAGNOSIS — I484 Atypical atrial flutter: Secondary | ICD-10-CM

## 2014-01-22 DIAGNOSIS — I4892 Unspecified atrial flutter: Secondary | ICD-10-CM

## 2014-01-22 LAB — POCT INR: INR: 2.7

## 2014-02-19 ENCOUNTER — Ambulatory Visit (INDEPENDENT_AMBULATORY_CARE_PROVIDER_SITE_OTHER): Payer: Medicare Other | Admitting: *Deleted

## 2014-02-19 DIAGNOSIS — Z7901 Long term (current) use of anticoagulants: Secondary | ICD-10-CM

## 2014-02-19 DIAGNOSIS — I4892 Unspecified atrial flutter: Secondary | ICD-10-CM

## 2014-02-19 DIAGNOSIS — Z5181 Encounter for therapeutic drug level monitoring: Secondary | ICD-10-CM

## 2014-02-19 LAB — POCT INR: INR: 2.5

## 2014-03-19 ENCOUNTER — Ambulatory Visit (INDEPENDENT_AMBULATORY_CARE_PROVIDER_SITE_OTHER): Payer: Medicare Other | Admitting: *Deleted

## 2014-03-19 DIAGNOSIS — Z5181 Encounter for therapeutic drug level monitoring: Secondary | ICD-10-CM

## 2014-03-19 DIAGNOSIS — Z7901 Long term (current) use of anticoagulants: Secondary | ICD-10-CM

## 2014-03-19 DIAGNOSIS — I4892 Unspecified atrial flutter: Secondary | ICD-10-CM

## 2014-03-19 LAB — POCT INR: INR: 2.4

## 2014-04-29 ENCOUNTER — Ambulatory Visit (INDEPENDENT_AMBULATORY_CARE_PROVIDER_SITE_OTHER): Payer: Medicare Other | Admitting: *Deleted

## 2014-04-29 DIAGNOSIS — Z7901 Long term (current) use of anticoagulants: Secondary | ICD-10-CM

## 2014-04-29 DIAGNOSIS — Z5181 Encounter for therapeutic drug level monitoring: Secondary | ICD-10-CM

## 2014-04-29 DIAGNOSIS — I4892 Unspecified atrial flutter: Secondary | ICD-10-CM

## 2014-04-29 LAB — POCT INR: INR: 3

## 2014-05-22 ENCOUNTER — Other Ambulatory Visit: Payer: Self-pay | Admitting: Cardiovascular Disease

## 2014-05-25 ENCOUNTER — Encounter: Payer: Self-pay | Admitting: Cardiovascular Disease

## 2014-05-25 ENCOUNTER — Ambulatory Visit (INDEPENDENT_AMBULATORY_CARE_PROVIDER_SITE_OTHER): Payer: Medicare Other | Admitting: Cardiovascular Disease

## 2014-05-25 VITALS — BP 108/67 | HR 62 | Ht 70.0 in | Wt 209.0 lb

## 2014-05-25 DIAGNOSIS — I10 Essential (primary) hypertension: Secondary | ICD-10-CM

## 2014-05-25 DIAGNOSIS — Z7901 Long term (current) use of anticoagulants: Secondary | ICD-10-CM

## 2014-05-25 DIAGNOSIS — I4892 Unspecified atrial flutter: Secondary | ICD-10-CM

## 2014-05-25 DIAGNOSIS — I4891 Unspecified atrial fibrillation: Secondary | ICD-10-CM

## 2014-05-25 NOTE — Patient Instructions (Signed)
Your physician recommends that you schedule a follow-up appointment in: 9 months. You will receive a reminder letter in the mail in about 7 months reminding you to call and schedule your appointment. If you don't receive this letter, please contact our office. Your physician recommends that you continue on your current medications as directed. Please refer to the Current Medication list given to you today.

## 2014-05-25 NOTE — Progress Notes (Signed)
Patient ID: [name], male   DOB: 01/16/1934, 80 y.o.   MRN: [id number]      SUBJECTIVE: The patient is here to followup for atrial flutter/fibrillation, hypertension, and a cardiomyopathy which has since normalized. He is feeling well and is without complaints. Specifically, he denies chest pain and palpitations. He primarily sits and watches TV all day.   Review of Systems: As per "subjective", otherwise negative.  Allergies  Allergen Reactions  . Sulfonamide Derivatives     unknown  . Penicillins Rash    unknown    Current Outpatient Prescriptions  Medication Sig Dispense Refill  . amiodarone (PACERONE) 200 MG tablet Take 1 tablet (200 mg total) by mouth every morning. 30 tablet 6  . atorvastatin (LIPITOR) 10 MG tablet Take 1 tablet by mouth daily.    . calcium-vitamin D (OSCAL WITH D) 500-200 MG-UNIT per tablet Take 1 tablet by mouth daily with breakfast.    . Chromium Picolinate 200 MCG TABS Take 1 tablet by mouth daily.    [name] COUMADIN 5 MG tablet TAKE ONE-HALF (1/2) TABLET BY MOUTH ON MONDAYS AND FRIDAYS, TAKE ONE TABLET ON ALL OTHER DAYS. 30 tablet 2  . Flaxseed, Linseed, (FLAXSEED OIL) 1000 MG CAPS Take 1 capsule by mouth daily.      [name] latanoprost (XALATAN) 0.005 % ophthalmic solution Place 1 drop into both eyes at bedtime.    . Multiple Vitamin (MULTIVITAMIN WITH MINERALS) TABS tablet Take 1 tablet by mouth daily.    [name] MYRBETRIQ 50 MG TB24 tablet Take 1 tablet by mouth daily.    . Omega-3 Fatty Acids (FISH OIL) 1000 MG CAPS Take 1 capsule by mouth daily.    [name] Specialty Vitamins Products (ICAPS LUTEIN-ZEAXANTHIN PO) Take 1 tablet by mouth daily.      [name] warfarin (COUMADIN) 5 MG tablet Take 0.5-1 tablets (2.5-5 mg total) by mouth daily. Takes 1/2 tablet Monday and Friday and 1 tablet the rest of the week Resume in 7 days after surgery or once blood in urine has resolved. 30 tablet 3   No current facility-administered medications for this visit.    Past Medical History   Diagnosis Date  . Cardiomyopathy     a. Prior EF 30-35%, felt tachycardia induced cardiomyopathyy. b. Improved after cardioversion and restoration of normal sinus rhythm - EF 65-70% by echo 10/13  . Essential hypertension, benign   . Warfarin anticoagulation     For atrial flutter  . H/O amiodarone therapy     Used for atrial flutter after return of flutter after ablation  . Atrial flutter, paroxysmal     a. Hx of TEE with LAA clots. b. s/p ESP/RFCA of atrial flutter 08/2008. b. Recurrence of A-flutter after ablation/atypical, requiring DCCV and amiodaorone.  [name] BPH (benign prostatic hypertrophy)   . Urination, excessive at night   . Shortness of breath for last year    when pt lies down     Past Surgical History  Procedure Laterality Date  . Femoral hernia repair      x2  . Turp vaporization    . Total knee arthroplasty Right 2004  . Umbilical hernia repair    . Transurethral prostatectomy with gyrus instruments N/A 03/12/2013    Procedure: TRANSURETHRAL PROSTATECTOMY WITH GYRUS INSTRUMENTS;  Surgeon: Ailene Rud, MD;  Location: [facility];  Service: Urology;  Laterality: N/A;    History   Social History  . Marital Status: Married    Spouse Name: N/A    Number  of Children: N/A  . Years of Education: N/A   Occupational History  . Not on file.   Social History Main Topics  . Smoking status: Never Smoker   . Smokeless tobacco: Never Used     Comment: tobacco use- no  . Alcohol Use: No  . Drug Use: No  . Sexual Activity: Not on file   Other Topics Concern  . Not on file   Social History Narrative   Retired, married.     BP 108/67  Pulse 62 SpO2 97% Weight 209 lb (94.802 kg) Height 5\' 10"  (1.778 m)    PHYSICAL EXAM General: NAD Neck: No JVD, no thyromegaly. Lungs: Clear to auscultation bilaterally with normal respiratory effort. CV: Nondisplaced PMI. Irregular rhythm, normal rate, normal S1/S2, no S3, no murmur. No pretibial or periankle edema.   Abdomen: Soft, nontender, no hepatosplenomegaly, no distention.  Neurologic: Alert and oriented x 3.  Psych: Normal affect. Skin: Normal. Musculoskeletal: Normal range of motion, no gross deformities. Extremities: No clubbing or cyanosis.   ECG: Most recent ECG reviewed.      ASSESSMENT AND PLAN: 1. Atrial fibrillation/flutter:Rate is well controlled. Continue Coumadin and amiodarone.  2. Cardiomyopathy: Tachycardia mediated, with improvement to normal function, most recent LVEF 65-70% in 03/2012.  3. Essential hypertension:Blood pressure is normal today. Will continue to monitor.  Dispo: f/u 9 months.   Kate Sable, M.D., F.A.C.C.

## 2014-06-10 ENCOUNTER — Ambulatory Visit (INDEPENDENT_AMBULATORY_CARE_PROVIDER_SITE_OTHER): Payer: Medicare Other | Admitting: *Deleted

## 2014-06-10 DIAGNOSIS — Z7901 Long term (current) use of anticoagulants: Secondary | ICD-10-CM

## 2014-06-10 DIAGNOSIS — Z5181 Encounter for therapeutic drug level monitoring: Secondary | ICD-10-CM

## 2014-06-10 DIAGNOSIS — I4892 Unspecified atrial flutter: Secondary | ICD-10-CM

## 2014-06-10 LAB — POCT INR: INR: 2

## 2014-07-22 ENCOUNTER — Ambulatory Visit (INDEPENDENT_AMBULATORY_CARE_PROVIDER_SITE_OTHER): Payer: Medicare Other | Admitting: *Deleted

## 2014-07-22 DIAGNOSIS — I4892 Unspecified atrial flutter: Secondary | ICD-10-CM

## 2014-07-22 DIAGNOSIS — Z5181 Encounter for therapeutic drug level monitoring: Secondary | ICD-10-CM

## 2014-07-22 DIAGNOSIS — Z7901 Long term (current) use of anticoagulants: Secondary | ICD-10-CM

## 2014-07-22 LAB — POCT INR: INR: 2.4

## 2014-07-30 ENCOUNTER — Other Ambulatory Visit: Payer: Self-pay | Admitting: Cardiovascular Disease

## 2014-09-02 ENCOUNTER — Ambulatory Visit (INDEPENDENT_AMBULATORY_CARE_PROVIDER_SITE_OTHER): Payer: Medicare Other | Admitting: *Deleted

## 2014-09-02 DIAGNOSIS — Z5181 Encounter for therapeutic drug level monitoring: Secondary | ICD-10-CM

## 2014-09-02 DIAGNOSIS — I4892 Unspecified atrial flutter: Secondary | ICD-10-CM

## 2014-09-02 DIAGNOSIS — Z7901 Long term (current) use of anticoagulants: Secondary | ICD-10-CM

## 2014-09-02 LAB — POCT INR: INR: 2.6

## 2014-09-13 ENCOUNTER — Other Ambulatory Visit: Payer: Self-pay | Admitting: Cardiovascular Disease

## 2014-10-14 ENCOUNTER — Ambulatory Visit (INDEPENDENT_AMBULATORY_CARE_PROVIDER_SITE_OTHER): Payer: Medicare Other | Admitting: Pharmacist

## 2014-10-14 DIAGNOSIS — I4892 Unspecified atrial flutter: Secondary | ICD-10-CM

## 2014-10-14 DIAGNOSIS — Z7901 Long term (current) use of anticoagulants: Secondary | ICD-10-CM

## 2014-10-14 DIAGNOSIS — Z5181 Encounter for therapeutic drug level monitoring: Secondary | ICD-10-CM | POA: Diagnosis not present

## 2014-10-14 LAB — POCT INR: INR: 2.5

## 2014-10-15 ENCOUNTER — Other Ambulatory Visit: Payer: Self-pay | Admitting: Cardiovascular Disease

## 2014-10-23 IMAGING — CR DG CHEST 2V
2 series · 2 of 2 positions shown · non-contrast
Comparison: 12/23/2012

CLINICAL DATA: Preoperative evaluation for TURP, shortness of
breath when lying down at night, history benign essential
hypertension, cardiomyopathy, paroxysmal atrial flutter

EXAM:
CHEST  2 VIEW

[w chest pa]
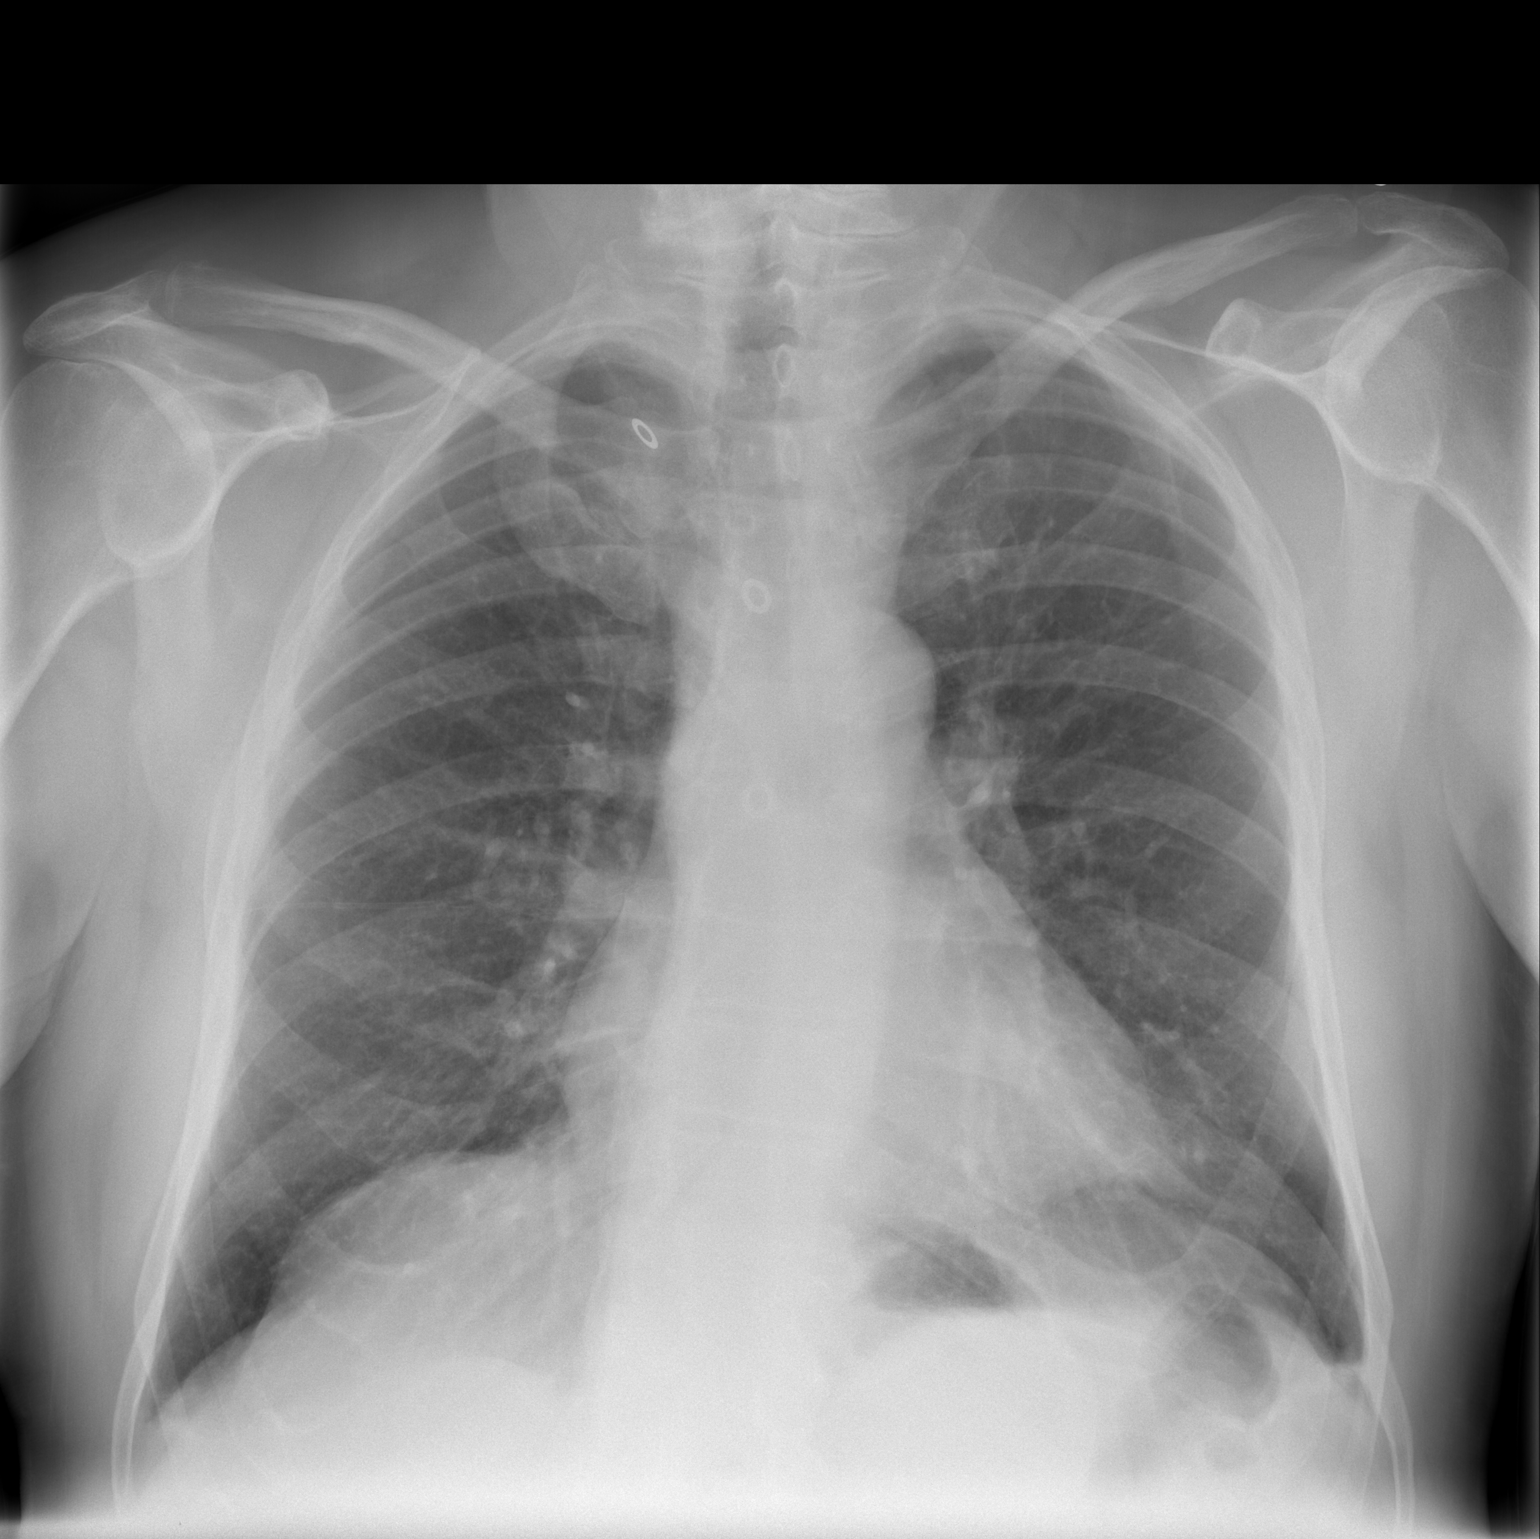

[w chest lat]
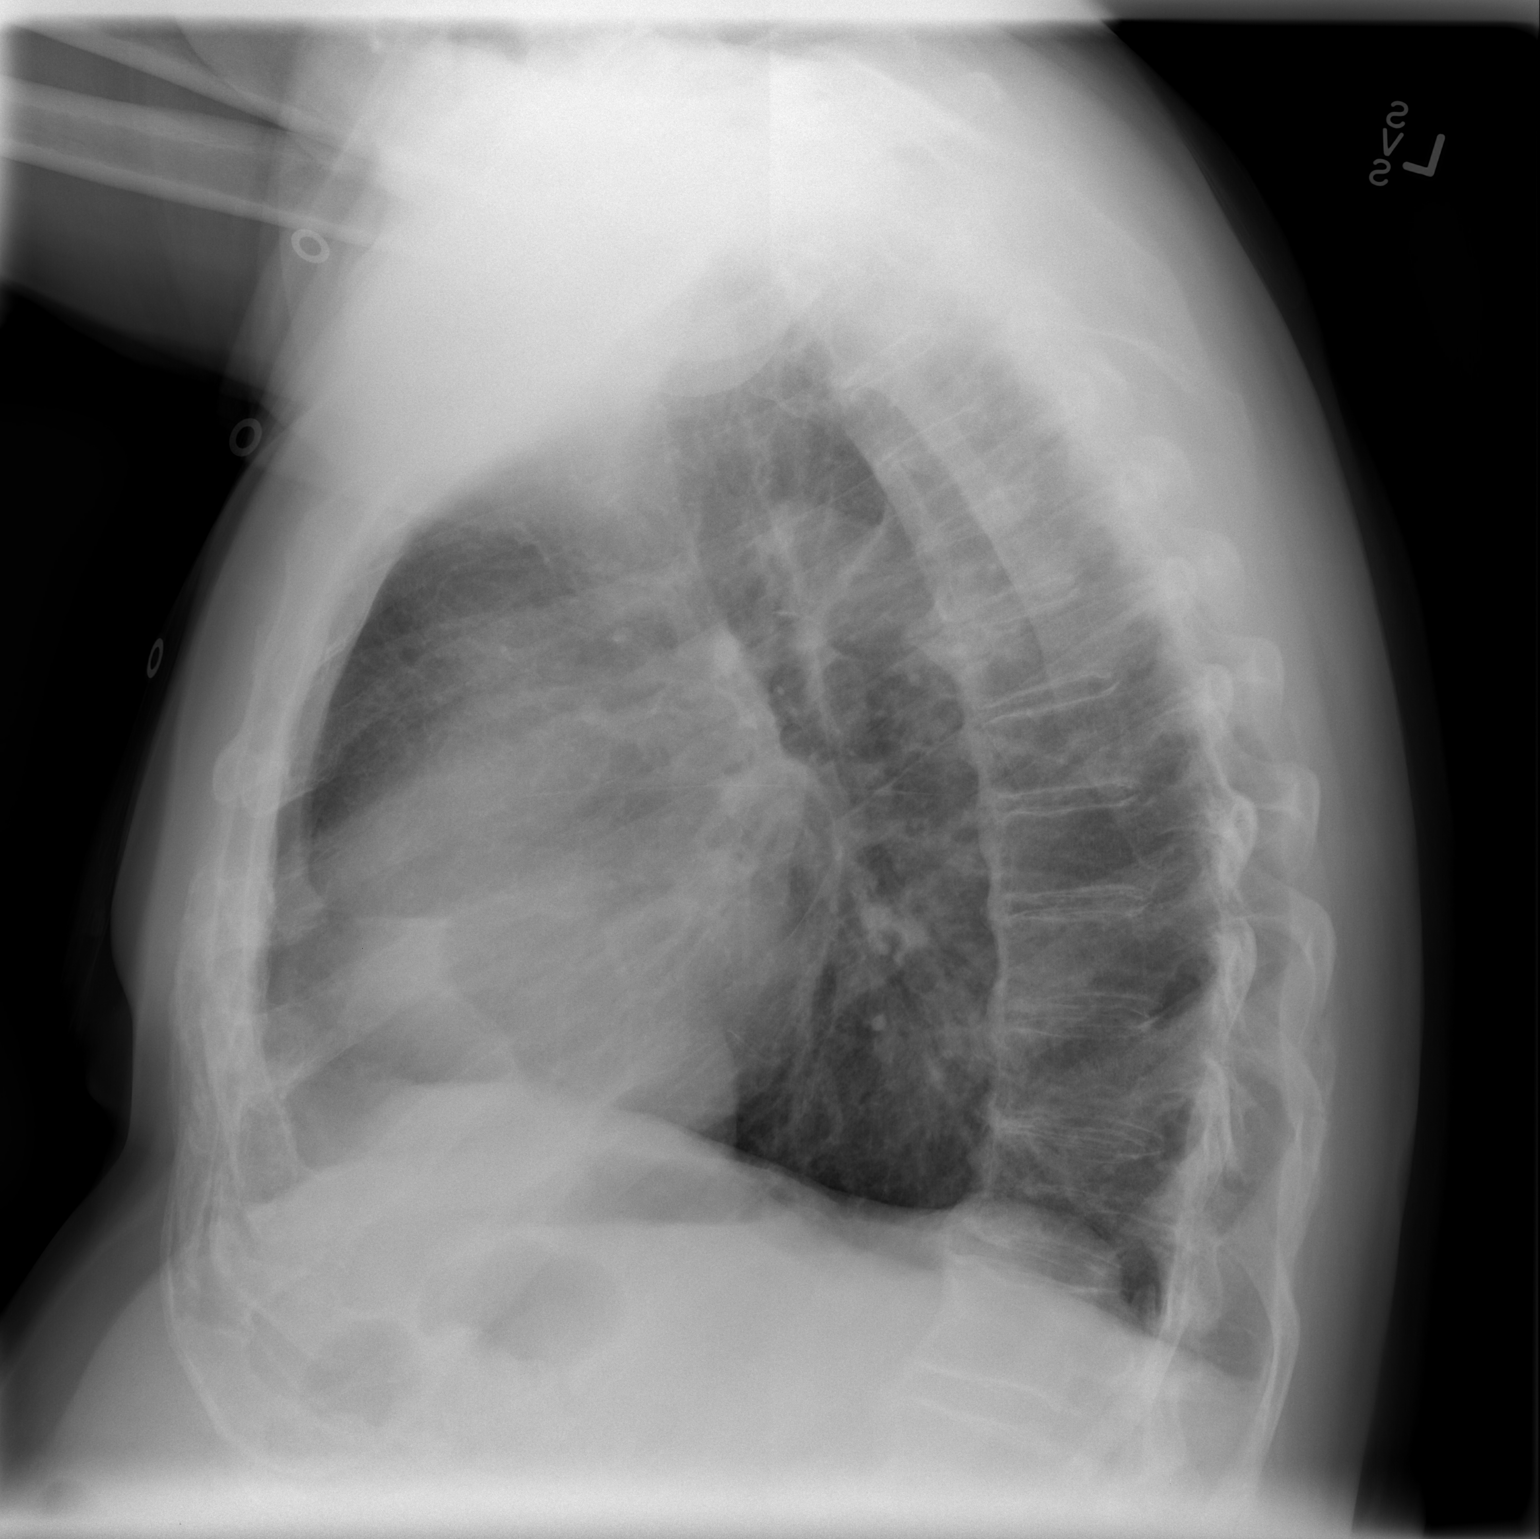

[2 of 2 positions shown; findings below may reference images not displayed]

FINDINGS: Upper-normal size of cardiac silhouette.

Mediastinal contours and pulmonary vascularity normal.

Few clothing artifacts project over chest.

Lungs clear.

No pleural effusion or pneumothorax.

Calcification of the anterior longitudinal ligament of the mid to
inferior thoracic spine question diffuse idiopathic skeletal
hyperostosis.
IMPRESSION: No acute abnormalities.

## 2014-11-25 ENCOUNTER — Ambulatory Visit (INDEPENDENT_AMBULATORY_CARE_PROVIDER_SITE_OTHER): Payer: Medicare Other | Admitting: *Deleted

## 2014-11-25 DIAGNOSIS — Z7901 Long term (current) use of anticoagulants: Secondary | ICD-10-CM

## 2014-11-25 DIAGNOSIS — I4892 Unspecified atrial flutter: Secondary | ICD-10-CM

## 2014-11-25 DIAGNOSIS — Z5181 Encounter for therapeutic drug level monitoring: Secondary | ICD-10-CM

## 2014-11-25 LAB — POCT INR: INR: 2.9

## 2015-01-06 ENCOUNTER — Ambulatory Visit (INDEPENDENT_AMBULATORY_CARE_PROVIDER_SITE_OTHER): Payer: Medicare Other | Admitting: *Deleted

## 2015-01-06 DIAGNOSIS — I4892 Unspecified atrial flutter: Secondary | ICD-10-CM

## 2015-01-06 DIAGNOSIS — Z7901 Long term (current) use of anticoagulants: Secondary | ICD-10-CM | POA: Diagnosis not present

## 2015-01-06 DIAGNOSIS — Z5181 Encounter for therapeutic drug level monitoring: Secondary | ICD-10-CM

## 2015-01-06 LAB — POCT INR: INR: 2.3

## 2015-02-16 ENCOUNTER — Encounter: Payer: Self-pay | Admitting: Cardiovascular Disease

## 2015-02-16 ENCOUNTER — Ambulatory Visit (INDEPENDENT_AMBULATORY_CARE_PROVIDER_SITE_OTHER): Payer: Medicare Other | Admitting: Cardiovascular Disease

## 2015-02-16 VITALS — BP 112/72 | HR 73 | Ht 70.0 in | Wt 218.0 lb

## 2015-02-16 DIAGNOSIS — Z79899 Other long term (current) drug therapy: Secondary | ICD-10-CM | POA: Diagnosis not present

## 2015-02-16 DIAGNOSIS — R0602 Shortness of breath: Secondary | ICD-10-CM

## 2015-02-16 DIAGNOSIS — I10 Essential (primary) hypertension: Secondary | ICD-10-CM | POA: Diagnosis not present

## 2015-02-16 DIAGNOSIS — I482 Chronic atrial fibrillation, unspecified: Secondary | ICD-10-CM

## 2015-02-16 MED ORDER — METOPROLOL SUCCINATE ER 25 MG PO TB24: 25.0000 mg | ORAL_TABLET | Freq: Every day | ORAL | Status: DC

## 2015-02-16 NOTE — Patient Instructions (Addendum)
   Stop Amiodarone.  Begin Toprol XL 25mg  daily - new sent to [facility] today. Continue all other medications.   Your physician wants you to follow up in:  1 year.  You will receive a reminder letter in the mail one-two months in advance.  If you don't receive a letter, please call our office to schedule the follow up appointment

## 2015-02-16 NOTE — Progress Notes (Signed)
Patient ID: [name], male   DOB: Jan 11, 1934, 81 y.o.   MRN: [id number]      SUBJECTIVE: The patient is here to followup for atrial flutter/fibrillation, hypertension, and a cardiomyopathy which has since normalized. He is feeling well and is without complaints. Specifically, he denies chest pain and palpitations. He has had some mild shortness of breath. Says thyroid and liver function is normal per blood tests by PCP.  ECG performed in the office today demonstrates atrial fibrillation with a nonspecific ST segment and T-wave abnormality, heart rate 77 bpm.  Review of Systems: As per "subjective", otherwise negative.  Allergies  Allergen Reactions  . Sulfonamide Derivatives     unknown  . Penicillins Rash    unknown    Current Outpatient Prescriptions  Medication Sig Dispense Refill  . amiodarone (PACERONE) 200 MG tablet TAKE ONE TABLET BY MOUTH IN THE MORNING 30 tablet 6  . calcium-vitamin D (OSCAL WITH D) 500-200 MG-UNIT per tablet Take 1 tablet by mouth daily with breakfast.    . Chromium Picolinate 200 MCG TABS Take 1 tablet by mouth daily.    [name] COUMADIN 5 MG tablet TAKE ONE-HALF (1/2) TABLET BY MOUTH ON MONDAYS AND FRIDAYS, TAKE ONE TABLET ON ALL OTHER DAYS. 30 tablet 3  . Flaxseed, Linseed, (FLAXSEED OIL) 1000 MG CAPS Take 1 capsule by mouth daily.      [name] latanoprost (XALATAN) 0.005 % ophthalmic solution Place 1 drop into both eyes at bedtime.    . Multiple Vitamin (MULTIVITAMIN WITH MINERALS) TABS tablet Take 1 tablet by mouth daily.    . Omega-3 Fatty Acids (FISH OIL) 1000 MG CAPS Take 1 capsule by mouth daily.    [name] Specialty Vitamins Products (ICAPS LUTEIN-ZEAXANTHIN PO) Take 1 tablet by mouth daily.      [name] warfarin (COUMADIN) 5 MG tablet Take 0.5-1 tablets (2.5-5 mg total) by mouth daily. Takes 1/2 tablet Monday and Friday and 1 tablet the rest of the week Resume in 7 days after surgery or once blood in urine has resolved. 30 tablet 3   No current  facility-administered medications for this visit.    Past Medical History  Diagnosis Date  . Cardiomyopathy     a. Prior EF 30-35%, felt tachycardia induced cardiomyopathyy. b. Improved after cardioversion and restoration of normal sinus rhythm - EF 65-70% by echo 10/13  . Essential hypertension, benign   . Warfarin anticoagulation     For atrial flutter  . H/O amiodarone therapy     Used for atrial flutter after return of flutter after ablation  . Atrial flutter, paroxysmal     a. Hx of TEE with LAA clots. b. s/p ESP/RFCA of atrial flutter 08/2008. b. Recurrence of A-flutter after ablation/atypical, requiring DCCV and amiodaorone.  [name] BPH (benign prostatic hypertrophy)   . Urination, excessive at night   . Shortness of breath for last year    when pt lies down     Past Surgical History  Procedure Laterality Date  . Femoral hernia repair      x2  . Turp vaporization    . Total knee arthroplasty Right 2004  . Umbilical hernia repair    . Transurethral prostatectomy with gyrus instruments N/A 03/12/2013    Procedure: TRANSURETHRAL PROSTATECTOMY WITH GYRUS INSTRUMENTS;  Surgeon: Ailene Rud, MD;  Location: [facility];  Service: Urology;  Laterality: N/A;    Social History   Social History  . Marital Status: Married    Spouse Name: N/A  .  Number of Children: N/A  . Years of Education: N/A   Occupational History  . Not on file.   Social History Main Topics  . Smoking status: Never Smoker   . Smokeless tobacco: Never Used     Comment: tobacco use- no  . Alcohol Use: No  . Drug Use: No  . Sexual Activity: Not on file   Other Topics Concern  . Not on file   Social History Narrative   Retired, married.      Filed Vitals:   02/16/15 1249  Height: 5\' 10"  (1.778 m)  Weight: 218 lb (98.884 kg)    PHYSICAL EXAM General: NAD Neck: No JVD, no thyromegaly. Lungs: Clear to auscultation bilaterally with normal respiratory effort. CV: Nondisplaced PMI. Irregular  rhythm, normal rate, normal S1/S2, no S3, no murmur. No pretibial or periankle edema.  Abdomen: Soft, nontender, no distention.  Neurologic: Alert and oriented x 3.  Psych: Normal affect. Skin: Normal. Musculoskeletal: Normal range of motion, no gross deformities. Extremities: No clubbing or cyanosis.    ECG: Most recent ECG reviewed.      ASSESSMENT AND PLAN: 1. Atrial fibrillation/flutter:Symptomatically stable. Rate is well controlled. Continue Coumadin. Will d/c amiodarone as he remains in atrial fibrillation and start Toprol-XL 25 mg daily for rate control.   2. Cardiomyopathy: Tachycardia mediated, with improvement to normal function, most recent LVEF 65-70% in 03/2012.   3. Essential hypertension:Blood pressure is normal today. Will continue to monitor.  Dispo: f/u 1 year.   Kate Sable, M.D., F.A.C.C.

## 2015-02-17 ENCOUNTER — Ambulatory Visit (INDEPENDENT_AMBULATORY_CARE_PROVIDER_SITE_OTHER): Payer: Medicare Other | Admitting: *Deleted

## 2015-02-17 DIAGNOSIS — I4892 Unspecified atrial flutter: Secondary | ICD-10-CM | POA: Diagnosis not present

## 2015-02-17 DIAGNOSIS — Z5181 Encounter for therapeutic drug level monitoring: Secondary | ICD-10-CM

## 2015-02-17 DIAGNOSIS — Z7901 Long term (current) use of anticoagulants: Secondary | ICD-10-CM

## 2015-02-17 LAB — POCT INR: INR: 2.3

## 2015-03-17 ENCOUNTER — Ambulatory Visit (INDEPENDENT_AMBULATORY_CARE_PROVIDER_SITE_OTHER): Payer: Medicare Other | Admitting: *Deleted

## 2015-03-17 DIAGNOSIS — Z7901 Long term (current) use of anticoagulants: Secondary | ICD-10-CM

## 2015-03-17 DIAGNOSIS — I4892 Unspecified atrial flutter: Secondary | ICD-10-CM | POA: Diagnosis not present

## 2015-03-17 DIAGNOSIS — Z5181 Encounter for therapeutic drug level monitoring: Secondary | ICD-10-CM

## 2015-03-17 LAB — POCT INR: INR: 2.3

## 2015-03-18 ENCOUNTER — Other Ambulatory Visit: Payer: Self-pay | Admitting: Cardiovascular Disease

## 2015-05-05 ENCOUNTER — Ambulatory Visit (INDEPENDENT_AMBULATORY_CARE_PROVIDER_SITE_OTHER): Payer: Medicare Other | Admitting: *Deleted

## 2015-05-05 DIAGNOSIS — Z5181 Encounter for therapeutic drug level monitoring: Secondary | ICD-10-CM | POA: Diagnosis not present

## 2015-05-05 DIAGNOSIS — I4892 Unspecified atrial flutter: Secondary | ICD-10-CM | POA: Diagnosis not present

## 2015-05-05 DIAGNOSIS — Z7901 Long term (current) use of anticoagulants: Secondary | ICD-10-CM

## 2015-05-05 LAB — POCT INR: INR: 1.6

## 2015-05-24 ENCOUNTER — Ambulatory Visit (INDEPENDENT_AMBULATORY_CARE_PROVIDER_SITE_OTHER): Payer: Medicare Other | Admitting: *Deleted

## 2015-05-24 DIAGNOSIS — Z7901 Long term (current) use of anticoagulants: Secondary | ICD-10-CM

## 2015-05-24 DIAGNOSIS — I4892 Unspecified atrial flutter: Secondary | ICD-10-CM

## 2015-05-24 DIAGNOSIS — Z5181 Encounter for therapeutic drug level monitoring: Secondary | ICD-10-CM | POA: Diagnosis not present

## 2015-05-24 LAB — POCT INR: INR: 1.2

## 2015-06-02 ENCOUNTER — Ambulatory Visit (INDEPENDENT_AMBULATORY_CARE_PROVIDER_SITE_OTHER): Payer: Medicare Other | Admitting: *Deleted

## 2015-06-02 DIAGNOSIS — I4892 Unspecified atrial flutter: Secondary | ICD-10-CM

## 2015-06-02 DIAGNOSIS — Z7901 Long term (current) use of anticoagulants: Secondary | ICD-10-CM | POA: Diagnosis not present

## 2015-06-02 DIAGNOSIS — Z5181 Encounter for therapeutic drug level monitoring: Secondary | ICD-10-CM | POA: Diagnosis not present

## 2015-06-02 LAB — POCT INR: INR: 1.4

## 2015-06-09 ENCOUNTER — Ambulatory Visit (INDEPENDENT_AMBULATORY_CARE_PROVIDER_SITE_OTHER): Payer: Medicare Other | Admitting: *Deleted

## 2015-06-09 DIAGNOSIS — Z5181 Encounter for therapeutic drug level monitoring: Secondary | ICD-10-CM | POA: Diagnosis not present

## 2015-06-09 DIAGNOSIS — I4892 Unspecified atrial flutter: Secondary | ICD-10-CM | POA: Diagnosis not present

## 2015-06-09 DIAGNOSIS — Z7901 Long term (current) use of anticoagulants: Secondary | ICD-10-CM | POA: Diagnosis not present

## 2015-06-09 LAB — POCT INR: INR: 1.6

## 2015-06-16 ENCOUNTER — Ambulatory Visit (INDEPENDENT_AMBULATORY_CARE_PROVIDER_SITE_OTHER): Payer: Medicare Other | Admitting: *Deleted

## 2015-06-16 DIAGNOSIS — I4892 Unspecified atrial flutter: Secondary | ICD-10-CM | POA: Diagnosis not present

## 2015-06-16 DIAGNOSIS — Z7901 Long term (current) use of anticoagulants: Secondary | ICD-10-CM

## 2015-06-16 DIAGNOSIS — Z5181 Encounter for therapeutic drug level monitoring: Secondary | ICD-10-CM

## 2015-06-16 LAB — POCT INR: INR: 1.8

## 2015-06-30 ENCOUNTER — Ambulatory Visit (INDEPENDENT_AMBULATORY_CARE_PROVIDER_SITE_OTHER): Payer: Medicare Other | Admitting: *Deleted

## 2015-06-30 DIAGNOSIS — I4892 Unspecified atrial flutter: Secondary | ICD-10-CM

## 2015-06-30 DIAGNOSIS — Z7901 Long term (current) use of anticoagulants: Secondary | ICD-10-CM | POA: Diagnosis not present

## 2015-06-30 DIAGNOSIS — Z5181 Encounter for therapeutic drug level monitoring: Secondary | ICD-10-CM | POA: Diagnosis not present

## 2015-06-30 LAB — POCT INR: INR: 1.8

## 2015-07-09 ENCOUNTER — Other Ambulatory Visit: Payer: Self-pay | Admitting: Cardiovascular Disease

## 2015-07-19 ENCOUNTER — Ambulatory Visit (INDEPENDENT_AMBULATORY_CARE_PROVIDER_SITE_OTHER): Payer: Medicare Other | Admitting: *Deleted

## 2015-07-19 DIAGNOSIS — I4892 Unspecified atrial flutter: Secondary | ICD-10-CM

## 2015-07-19 DIAGNOSIS — Z7901 Long term (current) use of anticoagulants: Secondary | ICD-10-CM

## 2015-07-19 DIAGNOSIS — Z5181 Encounter for therapeutic drug level monitoring: Secondary | ICD-10-CM | POA: Diagnosis not present

## 2015-07-19 LAB — POCT INR: INR: 2.7

## 2015-08-18 ENCOUNTER — Ambulatory Visit (INDEPENDENT_AMBULATORY_CARE_PROVIDER_SITE_OTHER): Payer: Medicare Other | Admitting: *Deleted

## 2015-08-18 DIAGNOSIS — I4892 Unspecified atrial flutter: Secondary | ICD-10-CM

## 2015-08-18 DIAGNOSIS — Z5181 Encounter for therapeutic drug level monitoring: Secondary | ICD-10-CM

## 2015-08-18 DIAGNOSIS — Z7901 Long term (current) use of anticoagulants: Secondary | ICD-10-CM

## 2015-08-18 LAB — POCT INR: INR: 4.7

## 2015-09-05 ENCOUNTER — Ambulatory Visit (INDEPENDENT_AMBULATORY_CARE_PROVIDER_SITE_OTHER): Payer: Medicare Other | Admitting: *Deleted

## 2015-09-05 DIAGNOSIS — I4892 Unspecified atrial flutter: Secondary | ICD-10-CM | POA: Diagnosis not present

## 2015-09-05 DIAGNOSIS — Z7901 Long term (current) use of anticoagulants: Secondary | ICD-10-CM

## 2015-09-05 DIAGNOSIS — Z5181 Encounter for therapeutic drug level monitoring: Secondary | ICD-10-CM | POA: Diagnosis not present

## 2015-09-05 LAB — POCT INR: INR: 3.8

## 2015-09-13 ENCOUNTER — Ambulatory Visit (INDEPENDENT_AMBULATORY_CARE_PROVIDER_SITE_OTHER): Payer: Medicare Other | Admitting: *Deleted

## 2015-09-13 DIAGNOSIS — Z7901 Long term (current) use of anticoagulants: Secondary | ICD-10-CM

## 2015-09-13 DIAGNOSIS — I4892 Unspecified atrial flutter: Secondary | ICD-10-CM

## 2015-09-13 DIAGNOSIS — Z5181 Encounter for therapeutic drug level monitoring: Secondary | ICD-10-CM | POA: Diagnosis not present

## 2015-09-13 LAB — POCT INR: INR: 2.8

## 2015-09-17 ENCOUNTER — Other Ambulatory Visit: Payer: Self-pay | Admitting: Cardiovascular Disease

## 2015-09-28 ENCOUNTER — Other Ambulatory Visit: Payer: Self-pay | Admitting: Cardiovascular Disease

## 2015-10-04 ENCOUNTER — Ambulatory Visit (INDEPENDENT_AMBULATORY_CARE_PROVIDER_SITE_OTHER): Payer: Medicare Other | Admitting: *Deleted

## 2015-10-04 DIAGNOSIS — Z5181 Encounter for therapeutic drug level monitoring: Secondary | ICD-10-CM

## 2015-10-04 DIAGNOSIS — I4892 Unspecified atrial flutter: Secondary | ICD-10-CM | POA: Diagnosis not present

## 2015-10-04 DIAGNOSIS — Z7901 Long term (current) use of anticoagulants: Secondary | ICD-10-CM

## 2015-10-04 LAB — POCT INR: INR: 4.3

## 2015-10-18 ENCOUNTER — Ambulatory Visit (INDEPENDENT_AMBULATORY_CARE_PROVIDER_SITE_OTHER): Payer: Medicare Other | Admitting: *Deleted

## 2015-10-18 DIAGNOSIS — Z5181 Encounter for therapeutic drug level monitoring: Secondary | ICD-10-CM | POA: Diagnosis not present

## 2015-10-18 DIAGNOSIS — Z7901 Long term (current) use of anticoagulants: Secondary | ICD-10-CM | POA: Diagnosis not present

## 2015-10-18 DIAGNOSIS — I4892 Unspecified atrial flutter: Secondary | ICD-10-CM

## 2015-10-18 LAB — POCT INR: INR: 1.7

## 2015-11-15 ENCOUNTER — Ambulatory Visit (INDEPENDENT_AMBULATORY_CARE_PROVIDER_SITE_OTHER): Payer: Medicare Other | Admitting: *Deleted

## 2015-11-15 DIAGNOSIS — Z5181 Encounter for therapeutic drug level monitoring: Secondary | ICD-10-CM

## 2015-11-15 DIAGNOSIS — I4892 Unspecified atrial flutter: Secondary | ICD-10-CM

## 2015-11-15 LAB — POCT INR: INR: 2.1

## 2015-12-13 ENCOUNTER — Ambulatory Visit (INDEPENDENT_AMBULATORY_CARE_PROVIDER_SITE_OTHER): Payer: Medicare Other | Admitting: *Deleted

## 2015-12-13 DIAGNOSIS — Z5181 Encounter for therapeutic drug level monitoring: Secondary | ICD-10-CM | POA: Diagnosis not present

## 2015-12-13 DIAGNOSIS — I4892 Unspecified atrial flutter: Secondary | ICD-10-CM | POA: Diagnosis not present

## 2015-12-13 LAB — POCT INR: INR: 2.1

## 2016-01-10 ENCOUNTER — Ambulatory Visit (INDEPENDENT_AMBULATORY_CARE_PROVIDER_SITE_OTHER): Payer: Medicare Other | Admitting: *Deleted

## 2016-01-10 DIAGNOSIS — Z5181 Encounter for therapeutic drug level monitoring: Secondary | ICD-10-CM | POA: Diagnosis not present

## 2016-01-10 DIAGNOSIS — I4892 Unspecified atrial flutter: Secondary | ICD-10-CM

## 2016-01-10 LAB — POCT INR: INR: 1.7

## 2016-01-17 ENCOUNTER — Other Ambulatory Visit: Payer: Self-pay | Admitting: Cardiovascular Disease

## 2016-01-31 ENCOUNTER — Ambulatory Visit (INDEPENDENT_AMBULATORY_CARE_PROVIDER_SITE_OTHER): Payer: Medicare Other | Admitting: *Deleted

## 2016-01-31 DIAGNOSIS — Z5181 Encounter for therapeutic drug level monitoring: Secondary | ICD-10-CM | POA: Diagnosis not present

## 2016-01-31 DIAGNOSIS — I4892 Unspecified atrial flutter: Secondary | ICD-10-CM | POA: Diagnosis not present

## 2016-01-31 LAB — POCT INR: INR: 2.3

## 2016-02-28 ENCOUNTER — Ambulatory Visit (INDEPENDENT_AMBULATORY_CARE_PROVIDER_SITE_OTHER): Payer: Medicare Other | Admitting: *Deleted

## 2016-02-28 DIAGNOSIS — I4892 Unspecified atrial flutter: Secondary | ICD-10-CM

## 2016-02-28 DIAGNOSIS — Z5181 Encounter for therapeutic drug level monitoring: Secondary | ICD-10-CM | POA: Diagnosis not present

## 2016-02-28 LAB — POCT INR: INR: 2.7

## 2016-03-09 ENCOUNTER — Other Ambulatory Visit: Payer: Self-pay | Admitting: Cardiovascular Disease

## 2016-03-27 ENCOUNTER — Ambulatory Visit (INDEPENDENT_AMBULATORY_CARE_PROVIDER_SITE_OTHER): Payer: Medicare Other | Admitting: *Deleted

## 2016-03-27 DIAGNOSIS — I4892 Unspecified atrial flutter: Secondary | ICD-10-CM

## 2016-03-27 DIAGNOSIS — Z5181 Encounter for therapeutic drug level monitoring: Secondary | ICD-10-CM | POA: Diagnosis not present

## 2016-03-27 LAB — POCT INR: INR: 2.6

## 2016-03-27 MED ORDER — WARFARIN SODIUM 5 MG PO TABS: ORAL_TABLET | ORAL | 3 refills | Status: DC

## 2016-03-30 ENCOUNTER — Encounter: Payer: Self-pay | Admitting: Cardiovascular Disease

## 2016-03-30 ENCOUNTER — Ambulatory Visit (INDEPENDENT_AMBULATORY_CARE_PROVIDER_SITE_OTHER): Payer: Medicare Other | Admitting: Cardiovascular Disease

## 2016-03-30 VITALS — BP 106/71 | HR 74 | Ht 70.0 in | Wt 217.0 lb

## 2016-03-30 DIAGNOSIS — I482 Chronic atrial fibrillation, unspecified: Secondary | ICD-10-CM | POA: Insufficient documentation

## 2016-03-30 DIAGNOSIS — I10 Essential (primary) hypertension: Secondary | ICD-10-CM | POA: Diagnosis not present

## 2016-03-30 NOTE — Progress Notes (Signed)
SUBJECTIVE: The patient is here to followup for atrial flutter/fibrillation, hypertension, and a cardiomyopathy which has since normalized.  He is feeling well and is without complaints. Specifically, he denies chest pain and palpitations. He says he does not exercise and sometimes feels fatigued.  ECG performed in the office today demonstrates atrial fibrillation with PVCs, heart rate 70 bpm.    Review of Systems: As per "subjective", otherwise negative.  Allergies  Allergen Reactions  . Penicillins Rash    unknown  . Sulfonamide Derivatives Rash    unknown    Current Outpatient Prescriptions  Medication Sig Dispense Refill  . atorvastatin (LIPITOR) 10 MG tablet Take 1 tablet by mouth daily.    . Flaxseed, Linseed, (FLAXSEED OIL) 1000 MG CAPS Take 1 capsule by mouth daily.      [name] latanoprost (XALATAN) 0.005 % ophthalmic solution Place 1 drop into both eyes at bedtime.    . metoprolol succinate (TOPROL-XL) 25 MG 24 hr tablet TAKE ONE TABLET BY MOUTH DAILY 30 tablet 6  . Multiple Vitamins-Minerals (ICAPS PO) Take 1 tablet by mouth daily.    . Omega-3 Fatty Acids (FISH OIL) 1000 MG CAPS Take 1 capsule by mouth daily.    [name] Specialty Vitamins Products (PROSTATE PO) Take 2 tablets by mouth daily.    [name] warfarin (COUMADIN) 5 MG tablet Take 1 tablet daily except 1 1/2 tablets on Tuesdays and Saturdays 45 tablet 3   No current facility-administered medications for this visit.     Past Medical History:  Diagnosis Date  . Atrial flutter, paroxysmal ([place])    a. Hx of TEE with LAA clots. b. s/p ESP/RFCA of atrial flutter 08/2008. b. Recurrence of A-flutter after ablation/atypical, requiring DCCV and amiodaorone.  [name] BPH (benign prostatic hypertrophy)   . Cardiomyopathy ([place])    a. Prior EF 30-35%, felt tachycardia induced cardiomyopathyy. b. Improved after cardioversion and restoration of normal sinus rhythm - EF 65-70% by echo 10/13  . Essential hypertension, benign   . H/O  amiodarone therapy    Used for atrial flutter after return of flutter after ablation  . Shortness of breath for last year   when pt lies down   . Urination, excessive at night   . Warfarin anticoagulation    For atrial flutter    Past Surgical History:  Procedure Laterality Date  . FEMORAL HERNIA REPAIR     x2  . TOTAL KNEE ARTHROPLASTY Right 2004  . TRANSURETHRAL PROSTATECTOMY WITH GYRUS INSTRUMENTS N/A 03/12/2013   Procedure: TRANSURETHRAL PROSTATECTOMY WITH GYRUS INSTRUMENTS;  Surgeon: Ailene Rud, MD;  Location: [facility];  Service: Urology;  Laterality: N/A;  . TURP VAPORIZATION    . UMBILICAL HERNIA REPAIR      Social History   Social History  . Marital status: Widowed    Spouse name: N/A  . Number of children: N/A  . Years of education: N/A   Occupational History  . Not on file.   Social History Main Topics  . Smoking status: Never Smoker  . Smokeless tobacco: Never Used     Comment: tobacco use- no  . Alcohol use No  . Drug use: No  . Sexual activity: Not on file   Other Topics Concern  . Not on file   Social History Narrative   Retired, married.      Vitals:   03/30/16 1302  BP: 106/71  Pulse: 74  Weight: 217 lb (98.4 kg)  Height: 5\' 10"  (1.778 m)  PHYSICAL EXAM General: NAD HEENT: Normal. Neck: No JVD, no thyromegaly. Lungs: Clear to auscultation bilaterally with normal respiratory effort. CV: Nondisplaced PMI. Irregular rhythm, normal rate, normal S1/S2, no S3, no murmur. No pretibial or periankle edema. No carotid bruits. Abdomen: Soft, nontender, no distention.  Neurologic: Alert and oriented.  Psych: Normal affect. Skin: Normal. Musculoskeletal: No gross deformities.    ECG: Most recent ECG reviewed.      ASSESSMENT AND PLAN: 1. Atrial fibrillation/flutter:Symptomatically stable. Rate is well controlled. Continue warfarin and Toprol-XL 25 mg daily for rate control.   2. Cardiomyopathy: Tachycardia mediated, with  improvement to normal function, most recent LVEF 65-70% in 03/2012.   3. Essential hypertension:Blood pressure is low normal today. Will continue to monitor.  Dispo: f/u 1 year.   Kate Sable, M.D., F.A.C.C.

## 2016-03-30 NOTE — Patient Instructions (Signed)

## 2016-04-26 ENCOUNTER — Other Ambulatory Visit: Payer: Self-pay | Admitting: Cardiovascular Disease

## 2016-05-08 ENCOUNTER — Ambulatory Visit (INDEPENDENT_AMBULATORY_CARE_PROVIDER_SITE_OTHER): Payer: Medicare Other | Admitting: *Deleted

## 2016-05-08 DIAGNOSIS — I4892 Unspecified atrial flutter: Secondary | ICD-10-CM | POA: Diagnosis not present

## 2016-05-08 DIAGNOSIS — Z5181 Encounter for therapeutic drug level monitoring: Secondary | ICD-10-CM

## 2016-05-08 LAB — POCT INR: INR: 2.3

## 2016-06-26 ENCOUNTER — Ambulatory Visit (INDEPENDENT_AMBULATORY_CARE_PROVIDER_SITE_OTHER): Payer: Medicare Other | Admitting: *Deleted

## 2016-06-26 DIAGNOSIS — Z5181 Encounter for therapeutic drug level monitoring: Secondary | ICD-10-CM | POA: Diagnosis not present

## 2016-06-26 DIAGNOSIS — I4892 Unspecified atrial flutter: Secondary | ICD-10-CM

## 2016-06-26 LAB — POCT INR: INR: 1.9

## 2016-07-31 ENCOUNTER — Ambulatory Visit (INDEPENDENT_AMBULATORY_CARE_PROVIDER_SITE_OTHER): Payer: Medicare Other | Admitting: *Deleted

## 2016-07-31 DIAGNOSIS — Z5181 Encounter for therapeutic drug level monitoring: Secondary | ICD-10-CM

## 2016-07-31 DIAGNOSIS — I4892 Unspecified atrial flutter: Secondary | ICD-10-CM | POA: Diagnosis not present

## 2016-07-31 LAB — POCT INR: INR: 2.6

## 2016-08-20 ENCOUNTER — Other Ambulatory Visit: Payer: Self-pay | Admitting: Cardiovascular Disease

## 2016-08-28 ENCOUNTER — Ambulatory Visit (INDEPENDENT_AMBULATORY_CARE_PROVIDER_SITE_OTHER): Payer: Medicare Other | Admitting: *Deleted

## 2016-08-28 DIAGNOSIS — I4892 Unspecified atrial flutter: Secondary | ICD-10-CM

## 2016-08-28 DIAGNOSIS — Z5181 Encounter for therapeutic drug level monitoring: Secondary | ICD-10-CM

## 2016-08-28 LAB — POCT INR: INR: 2.3

## 2016-09-03 ENCOUNTER — Other Ambulatory Visit: Payer: Self-pay | Admitting: Cardiovascular Disease

## 2016-10-02 ENCOUNTER — Ambulatory Visit (INDEPENDENT_AMBULATORY_CARE_PROVIDER_SITE_OTHER): Payer: Medicare Other | Admitting: *Deleted

## 2016-10-02 DIAGNOSIS — I4892 Unspecified atrial flutter: Secondary | ICD-10-CM

## 2016-10-02 DIAGNOSIS — Z5181 Encounter for therapeutic drug level monitoring: Secondary | ICD-10-CM

## 2016-10-02 LAB — POCT INR: INR: 2.6

## 2016-11-13 ENCOUNTER — Ambulatory Visit (INDEPENDENT_AMBULATORY_CARE_PROVIDER_SITE_OTHER): Payer: Medicare Other | Admitting: *Deleted

## 2016-11-13 DIAGNOSIS — Z5181 Encounter for therapeutic drug level monitoring: Secondary | ICD-10-CM | POA: Diagnosis not present

## 2016-11-13 DIAGNOSIS — I4892 Unspecified atrial flutter: Secondary | ICD-10-CM

## 2016-11-13 LAB — POCT INR: INR: 2.5

## 2016-12-21 ENCOUNTER — Other Ambulatory Visit: Payer: Self-pay | Admitting: Cardiovascular Disease

## 2017-01-01 ENCOUNTER — Ambulatory Visit (INDEPENDENT_AMBULATORY_CARE_PROVIDER_SITE_OTHER): Payer: Medicare Other | Admitting: *Deleted

## 2017-01-01 DIAGNOSIS — I4892 Unspecified atrial flutter: Secondary | ICD-10-CM | POA: Diagnosis not present

## 2017-01-01 DIAGNOSIS — Z5181 Encounter for therapeutic drug level monitoring: Secondary | ICD-10-CM

## 2017-01-01 LAB — POCT INR: INR: 3.6

## 2017-01-22 ENCOUNTER — Ambulatory Visit (INDEPENDENT_AMBULATORY_CARE_PROVIDER_SITE_OTHER): Payer: Medicare Other | Admitting: *Deleted

## 2017-01-22 DIAGNOSIS — I4892 Unspecified atrial flutter: Secondary | ICD-10-CM | POA: Diagnosis not present

## 2017-01-22 DIAGNOSIS — Z5181 Encounter for therapeutic drug level monitoring: Secondary | ICD-10-CM | POA: Diagnosis not present

## 2017-01-22 LAB — POCT INR: INR: 2.4

## 2017-02-21 ENCOUNTER — Ambulatory Visit (INDEPENDENT_AMBULATORY_CARE_PROVIDER_SITE_OTHER): Payer: Medicare Other | Admitting: *Deleted

## 2017-02-21 DIAGNOSIS — Z5181 Encounter for therapeutic drug level monitoring: Secondary | ICD-10-CM

## 2017-02-21 DIAGNOSIS — I4892 Unspecified atrial flutter: Secondary | ICD-10-CM | POA: Diagnosis not present

## 2017-02-21 LAB — POCT INR: INR: 2.7

## 2017-04-06 ENCOUNTER — Other Ambulatory Visit: Payer: Self-pay | Admitting: Cardiovascular Disease

## 2017-04-11 ENCOUNTER — Encounter: Payer: Self-pay | Admitting: Cardiovascular Disease

## 2017-04-11 ENCOUNTER — Ambulatory Visit (INDEPENDENT_AMBULATORY_CARE_PROVIDER_SITE_OTHER): Payer: Medicare Other | Admitting: Cardiovascular Disease

## 2017-04-11 VITALS — BP 128/74 | HR 78 | Ht 70.0 in | Wt 223.0 lb

## 2017-04-11 DIAGNOSIS — I4892 Unspecified atrial flutter: Secondary | ICD-10-CM | POA: Diagnosis not present

## 2017-04-11 DIAGNOSIS — R5383 Other fatigue: Secondary | ICD-10-CM | POA: Diagnosis not present

## 2017-04-11 DIAGNOSIS — I10 Essential (primary) hypertension: Secondary | ICD-10-CM

## 2017-04-11 DIAGNOSIS — I482 Chronic atrial fibrillation, unspecified: Secondary | ICD-10-CM

## 2017-04-11 NOTE — Progress Notes (Signed)
SUBJECTIVE: The patient is here to followup for atrial flutter/fibrillation, hypertension, and a cardiomyopathy which has since normalized.  ECG performed in the office today which I ordered and personally interpreted demonstrated atrial flutter with variable conduction, 80 bpm.  He denies chest pain and shortness of breath. He said he fatigues very easily and this has been ongoing for the past year. He had some blood work done ordered by his PCP last week. I have requested these results. He also complains about bilateral finger numbness. He said he sits around and eats quite a bit of the time and does no walking. He rakes leaves when he has to. He denies hematochezia melena. He has a good appetite.       Review of Systems: As per "subjective", otherwise negative.  Allergies  Allergen Reactions  . Penicillins Rash    unknown  . Sulfonamide Derivatives Rash    unknown    Current Outpatient Prescriptions  Medication Sig Dispense Refill  . alfuzosin (UROXATRAL) 10 MG 24 hr tablet Take 10 mg by mouth daily with breakfast.    . atorvastatin (LIPITOR) 10 MG tablet Take 1 tablet by mouth daily.    . Flaxseed, Linseed, (FLAXSEED OIL) 1000 MG CAPS Take 1 capsule by mouth daily.      [name] latanoprost (XALATAN) 0.005 % ophthalmic solution Place 1 drop into both eyes at bedtime.    . metoprolol succinate (TOPROL-XL) 25 MG 24 hr tablet TAKE ONE TABLET BY MOUTH DAILY 30 tablet 0  . Multiple Vitamins-Minerals (ICAPS PO) Take 1 tablet by mouth daily.    . Omega-3 Fatty Acids (FISH OIL) 1000 MG CAPS Take 1 capsule by mouth daily.    [name] Specialty Vitamins Products (PROSTATE PO) Take 2 tablets by mouth daily.    . vitamin B-12 (CYANOCOBALAMIN) 1000 MCG tablet Take 1,000 mcg by mouth daily.    [name] warfarin (COUMADIN) 5 MG tablet Take 1 tablet daily except 1 1/2 tablets on Tuesdays and Saturdays 45 tablet 3   No current facility-administered medications for this visit.     Past Medical History:    Diagnosis Date  . Atrial flutter, paroxysmal ([place])    a. Hx of TEE with LAA clots. b. s/p ESP/RFCA of atrial flutter 08/2008. b. Recurrence of A-flutter after ablation/atypical, requiring DCCV and amiodaorone.  [name] BPH (benign prostatic hypertrophy)   . Cardiomyopathy ([place])    a. Prior EF 30-35%, felt tachycardia induced cardiomyopathyy. b. Improved after cardioversion and restoration of normal sinus rhythm - EF 65-70% by echo 10/13  . Essential hypertension, benign   . H/O amiodarone therapy    Used for atrial flutter after return of flutter after ablation  . Shortness of breath for last year   when pt lies down   . Urination, excessive at night   . Warfarin anticoagulation    For atrial flutter    Past Surgical History:  Procedure Laterality Date  . FEMORAL HERNIA REPAIR     x2  . TOTAL KNEE ARTHROPLASTY Right 2004  . TRANSURETHRAL PROSTATECTOMY WITH GYRUS INSTRUMENTS N/A 03/12/2013   Procedure: TRANSURETHRAL PROSTATECTOMY WITH GYRUS INSTRUMENTS;  Surgeon: Ailene Rud, MD;  Location: [facility];  Service: Urology;  Laterality: N/A;  . TURP VAPORIZATION    . UMBILICAL HERNIA REPAIR      Social History   Social History  . Marital status: Widowed    Spouse name: N/A  . Number of children: N/A  . Years of education: N/A  Occupational History  . Not on file.   Social History Main Topics  . Smoking status: Never Smoker  . Smokeless tobacco: Never Used     Comment: tobacco use- no  . Alcohol use No  . Drug use: No  . Sexual activity: Not on file   Other Topics Concern  . Not on file   Social History Narrative   Retired, married.      Vitals:   04/11/17 1244  BP: 128/74  Pulse: 78  SpO2: 96%  Weight: 223 lb (101.2 kg)  Height: 5\' 10"  (1.778 m)    Wt Readings from Last 3 Encounters:  04/11/17 223 lb (101.2 kg)  03/30/16 217 lb (98.4 kg)  02/16/15 218 lb (98.9 kg)     PHYSICAL EXAM General: NAD HEENT: Normal. Neck: No JVD, no thyromegaly. Lungs:  Clear to auscultation bilaterally with normal respiratory effort. CV: Regular rate and irregular rhythm, normal S1/S2, no S3, no murmur. No pretibial or periankle edema.  No carotid bruit.   Abdomen: Soft, nontender, no distention.  Neurologic: Alert and oriented.  Psych: Normal affect. Skin: Normal. Musculoskeletal: No gross deformities.    ECG: Most recent ECG reviewed.   Labs: Lab Results  Component Value Date/Time   K 3.5 03/13/2013 04:45 AM   BUN 13 03/13/2013 04:45 AM   CREATININE 1.05 03/13/2013 04:45 AM   HGB 15.1 03/13/2013 04:45 AM     Lipids: No results found for: LDLCALC, LDLDIRECT, CHOL, TRIG, HDL     ASSESSMENT AND PLAN:  1. Atrial fibrillation/flutter:Symptomatically stable. Rate is well controlled. Continue warfarin and Toprol-XL 25 mg daily for rate control.   2. Cardiomyopathy: Tachycardia mediated, with improvement to normal function, most recent LVEF 65-70% in 03/2012.   3. Essential hypertension:Blood pressure is normal. No changes to therapy.  4. Fatigue: I will request copies of recent blood work. If he has not had TSH, vitamin B-12, and vitamin D levels checked, I may consider this. I would not rule out the possibility of a stress test.    Disposition: Follow up 1 year.   Kate Sable, M.D., F.A.C.C.

## 2017-04-11 NOTE — Patient Instructions (Signed)

## 2017-04-16 ENCOUNTER — Ambulatory Visit (INDEPENDENT_AMBULATORY_CARE_PROVIDER_SITE_OTHER): Payer: Medicare Other | Admitting: *Deleted

## 2017-04-16 DIAGNOSIS — Z5181 Encounter for therapeutic drug level monitoring: Secondary | ICD-10-CM | POA: Diagnosis not present

## 2017-04-16 DIAGNOSIS — I4892 Unspecified atrial flutter: Secondary | ICD-10-CM | POA: Diagnosis not present

## 2017-04-16 LAB — POCT INR: INR: 2.2

## 2017-04-17 ENCOUNTER — Encounter: Payer: Self-pay | Admitting: *Deleted

## 2017-05-30 ENCOUNTER — Other Ambulatory Visit: Payer: Self-pay | Admitting: Cardiovascular Disease

## 2017-06-27 ENCOUNTER — Ambulatory Visit (INDEPENDENT_AMBULATORY_CARE_PROVIDER_SITE_OTHER): Payer: Medicare Other | Admitting: *Deleted

## 2017-06-27 DIAGNOSIS — I4892 Unspecified atrial flutter: Secondary | ICD-10-CM | POA: Diagnosis not present

## 2017-06-27 DIAGNOSIS — Z5181 Encounter for therapeutic drug level monitoring: Secondary | ICD-10-CM | POA: Diagnosis not present

## 2017-06-27 LAB — POCT INR: INR: 2.3

## 2017-06-27 NOTE — Patient Instructions (Signed)
Continue coumadin 1 tablet daily except 1 1/2 tablets on Tuesdays and Saturdays Recheck in 6 weeks 

## 2017-07-12 ENCOUNTER — Other Ambulatory Visit: Payer: Self-pay | Admitting: Cardiovascular Disease

## 2017-07-26 ENCOUNTER — Ambulatory Visit: Payer: Medicare Other | Admitting: Urology

## 2017-07-26 DIAGNOSIS — R351 Nocturia: Secondary | ICD-10-CM | POA: Diagnosis not present

## 2017-07-26 DIAGNOSIS — R310 Gross hematuria: Secondary | ICD-10-CM

## 2017-08-08 ENCOUNTER — Ambulatory Visit (INDEPENDENT_AMBULATORY_CARE_PROVIDER_SITE_OTHER): Payer: Medicare Other | Admitting: *Deleted

## 2017-08-08 DIAGNOSIS — I4892 Unspecified atrial flutter: Secondary | ICD-10-CM

## 2017-08-08 DIAGNOSIS — Z5181 Encounter for therapeutic drug level monitoring: Secondary | ICD-10-CM | POA: Diagnosis not present

## 2017-08-08 LAB — POCT INR: INR: 1.8

## 2017-08-08 NOTE — Patient Instructions (Signed)
Take coumadin 2 tablets tonight then resume 1 tablet daily except 1 1/2 tablets on Tuesdays and Saturdays Recheck in 4 weeks 

## 2017-08-14 ENCOUNTER — Other Ambulatory Visit: Payer: Self-pay | Admitting: Urology

## 2017-08-15 ENCOUNTER — Encounter: Payer: Self-pay | Admitting: *Deleted

## 2017-08-15 NOTE — Progress Notes (Signed)
Pt scheduled for TURBT on 08/26/17 by Dr Alyson Ingles / [facility].  Pt may hold coumadin 5 days before procedure.  He will take last dose of coumadin on 08/20/17 and resume coumadin after procedure per Dr Noland Fordyce instruction.  Repeat INR check 7 days after procedure. Clearance form faxed back to Dr Noland Fordyce office and pt called with instructions.

## 2017-08-20 ENCOUNTER — Telehealth: Payer: Self-pay

## 2017-08-20 NOTE — Patient Instructions (Signed)
[name]  08/20/2017     @PREFPERIOPPHARMACY @   Your procedure is scheduled on  08/26/2017   Report to [facility] at  830  A.M.  Call this number if you have problems the morning of surgery:  [phone]   Remember:  Do not eat food or drink liquids after midnight.  Take these medicines the morning of surgery with A SIP OF WATER  Metoprolol. Follow instructions from Dr Alyson Ingles concerning coumadin.   Do not wear jewelry, make-up or nail polish.  Do not wear lotions, powders, or perfumes, or deodorant.  Do not shave 48 hours prior to surgery.  Men may shave face and neck.  Do not bring valuables to the hospital.  [facility] is not responsible for any belongings or valuables.  Contacts, dentures or bridgework may not be worn into surgery.  Leave your suitcase in the car.  After surgery it may be brought to your room.  For patients admitted to the hospital, discharge time will be determined by your treatment team.  Patients discharged the day of surgery will not be allowed to drive home.   Name and phone number of your driver:   family Special instructions:  None  Please read over the following fact sheets that you were given. Anesthesia Post-op Instructions and Care and Recovery After Surgery       Transurethral Resection of Bladder Tumor Transurethral resection of a bladder tumor is the removal (resection) of a cancerous growth (tumor) on the inside wall of the bladder. The bladder is the organ that holds urine. The tumor is removed through the tube that carries urine out of the body (urethra). In a transurethral resection, a thin telescope with a light, a tiny camera, and an electric cutting edge (resectoscope) is passed through the urethra. In men, the opening of the urethra is at the end of the penis. In women, it is just above the vaginal opening. Tell a health care provider about:  Any allergies you have.  All medicines you are  taking, including vitamins, herbs, eye drops, creams, and over-the-counter medicines.  Any problems you or family members have had with anesthetic medicines.  Any blood disorders you have.  Any surgeries you have had.  Any medical conditions you have.  Any recent urinary tract infections you have had.  Whether you are pregnant or may be pregnant. What are the risks? Generally, this is a safe procedure. However, problems may occur, including:  Infection.  Bleeding.  Allergic reactions to medicines.  Damage to other structures or organs, such as: ? The urethra. ? The tubes that drain urine from the kidneys into the bladder (ureters).  Pain and burning during urination.  Difficulty urinating due to partial blockage of the urethra.  Inability to urinate (urinary retention).  What happens before the procedure?  Follow instructions from your health care provider about eating and drinking restrictions.  Ask your health care provider about: ? Changing or stopping your regular medicines. This is especially important if you are taking diabetes medicines or blood thinners. ? Taking medicines such as aspirin and ibuprofen. These medicines can thin your blood. Do not take these medicines before your procedure if your health care provider instructs you not to.  You may have a physical exam.  You may have tests, including: ? Blood tests. ? Urine tests. ? Electrocardiogram (ECG). This test measures the electrical activity of  the heart.  You may be given antibiotic medicine to help prevent infection.  Ask your health care provider how your surgical site will be marked or identified.  Plan to have someone take you home after the procedure. What happens during the procedure?  To reduce your risk of infection: ? Your health care team will wash or sanitize their hands. ? Your skin will be washed with soap.  An IV tube will be inserted into one of your veins.  You will be given  one or more of the following: ? A medicine to help you relax (sedative). ? A medicine to make you fall asleep (general anesthetic). ? A medicine that is injected into your spine to numb the area below and slightly above the injection site (spinal anesthetic).  Your legs will be placed in foot rests (stirrups) so that your legs are apart and your knees are bent.  The resectoscope will be passed through your urethra and into your bladder.  The part of your bladder that is affected by the tumor will be resected using the cutting edge of the resectoscope.  The resectoscope will be removed.  A thin, flexible tube (catheter) will be passed through your urethra and into your bladder. The catheter will drain urine into a bag outside of your body. ? Fluid may be passed through the catheter to keep the catheter open. The procedure may vary among health care providers and hospitals. What happens after the procedure?  Your blood pressure, heart rate, breathing rate, and blood oxygen level will be monitored often until the medicines you were given have worn off.  You may continue to receive fluids and medicines through an IV tube.  You will have some pain. Pain medicine will be available to help you.  You will have a catheter draining your urine. ? You will have blood in your urine. Your catheter may be kept in until your urine is clear. ? Your urinary drainage will be monitored. If necessary, your bladder may be rinsed out (irrigated) by passing fluid through your catheter.  You will be encouraged to walk around as soon as possible.  You may have to wear compression stockings. These stockings help to prevent blood clots and reduce swelling in your legs.  Do not drive for 24 hours if you received a sedative. This information is not intended to replace advice given to you by your health care provider. Make sure you discuss any questions you have with your health care provider. Document Released:  04/07/2009 Document Revised: 02/12/2016 Document Reviewed: 03/03/2015 Elsevier Interactive Patient Education  2018 [place].  Transurethral Resection of Bladder Tumor, Care After Refer to this sheet in the next few weeks. These instructions provide you with information about caring for yourself after your procedure. Your health care provider may also give you more specific instructions. Your treatment has been planned according to current medical practices, but problems sometimes occur. Call your health care provider if you have any problems or questions after your procedure. What can I expect after the procedure? After the procedure, it is common to have:  A small amount of blood in your urine for up to 2 weeks.  Soreness or mild discomfort from your catheter. After your catheter is removed, you may have mild soreness, especially when urinating.  Pain in your lower abdomen.  Follow these instructions at home:  Medicines  Take over-the-counter and prescription medicines only as told by your health care provider.  Do not drive or operate  heavy machinery while taking prescription pain medicine.  Do not drive for 24 hours if you received a sedative.  If you were prescribed antibiotic medicine, take it as told by your health care provider. Do not stop taking the antibiotic even if you start to feel better. Activity  Return to your normal activities as told by your health care provider. Ask your health care provider what activities are safe for you.  Do not lift anything that is heavier than 10 lb (4.5 kg) for as long as told by your health care provider.  Avoid intense physical activity for as long as told by your health care provider.  Walk at least one time every day. This helps to prevent blood clots. You may increase your physical activity gradually as you start to feel better. General instructions  Do not drink alcohol for as long as told by your health care provider. This  is especially important if you are taking prescription pain medicines.  Do not take baths, swim, or use a hot tub until your health care provider approves.  If you have a catheter, follow instructions from your health care provider about caring for your catheter and your drainage bag.  Drink enough fluid to keep your urine clear or pale yellow.  Wear compression stockings as told by your health care provider. These stockings help to prevent blood clots and reduce swelling in your legs.  Keep all follow-up visits as told by your health care provider. This is important. Contact a health care provider if:  You have pain that gets worse or does not improve with medicine.  You have blood in your urine for more than 2 weeks.  You have cloudy or bad-smelling urine.  You become constipated. Signs of constipation may include having: ? Fewer than three bowel movements in a week. ? Difficulty having a bowel movement. ? Stools that are dry, hard, or larger than normal.  You have a fever. Get help right away if:  You have: ? Severe pain. ? Bright-red blood in your urine. ? Blood clots in your urine. ? A lot of blood in your urine.  Your catheter has been removed and you are not able to urinate.  You have a catheter in place and the catheter is not draining urine. This information is not intended to replace advice given to you by your health care provider. Make sure you discuss any questions you have with your health care provider. Document Released: 05/23/2015 Document Revised: 02/12/2016 Document Reviewed: 03/03/2015 Elsevier Interactive Patient Education  2018 [employer].  Ureteroscopy Ureteroscopy is a procedure to check for and treat problems inside part of the urinary tract. In this procedure, a thin, tube-shaped instrument with a light at the end (ureteroscope) is used to look at the inside of the kidneys and the ureters, which are the tubes that carry urine from the kidneys to  the bladder. The ureteroscope is inserted into one or both of the ureters. You may need this procedure if you have frequent urinary tract infections (UTIs), blood in your urine, or a stone in one of your ureters. A ureteroscopy can be done to find the cause of urine blockage in a ureter and to evaluate other abnormalities inside the ureters or kidneys. If stones are found, they can be removed during the procedure. Polyps, abnormal tissue, and some types of tumors can also be removed or treated. The ureteroscope may also have a tool to remove tissue to be checked for disease under  a microscope (biopsy). Tell a health care provider about:  Any allergies you have.  All medicines you are taking, including vitamins, herbs, eye drops, creams, and over-the-counter medicines.  Any problems you or family members have had with anesthetic medicines.  Any blood disorders you have.  Any surgeries you have had.  Any medical conditions you have.  Whether you are pregnant or may be pregnant. What are the risks? Generally, this is a safe procedure. However, problems may occur, including:  Bleeding.  Infection.  Allergic reactions to medicines.  Scarring that narrows the ureter (stricture).  Creating a hole in the ureter (perforation).  What happens before the procedure? Staying hydrated Follow instructions from your health care provider about hydration, which may include:  Up to 2 hours before the procedure - you may continue to drink clear liquids, such as water, clear fruit juice, black coffee, and plain tea.  Eating and drinking restrictions Follow instructions from your health care provider about eating and drinking, which may include:  8 hours before the procedure - stop eating heavy meals or foods such as meat, fried foods, or fatty foods.  6 hours before the procedure - stop eating light meals or foods, such as toast or cereal.  6 hours before the procedure - stop drinking milk or  drinks that contain milk.  2 hours before the procedure - stop drinking clear liquids.  Medicines  Ask your health care provider about: ? Changing or stopping your regular medicines. This is especially important if you are taking diabetes medicines or blood thinners. ? Taking medicines such as aspirin and ibuprofen. These medicines can thin your blood. Do not take these medicines before your procedure if your health care provider instructs you not to.  You may be given antibiotic medicine to help prevent infection. General instructions  You may have a urine sample taken to check for infection.  Plan to have someone take you home from the hospital or clinic. What happens during the procedure?  To reduce your risk of infection: ? Your health care team will wash or sanitize their hands. ? Your skin will be washed with soap.  An IV tube will be inserted into one of your veins.  You will be given one of the following: ? A medicine to help you relax (sedative). ? A medicine to make you fall asleep (general anesthetic). ? A medicine that is injected into your spine to numb the area below and slightly above the injection site (spinal anesthetic).  To lower your risk of infection, you may be given an antibiotic medicine by an injection or through the IV tube.  The opening from which you urinate (urethra) will be cleaned with a germ-killing solution.  The ureteroscope will be passed through your urethra into your bladder.  A salt-water solution will flow through the ureteroscope to fill your bladder. This will help the health care provider see the openings of your ureters more clearly.  Then, the ureteroscope will be passed into your ureter. ? If a growth is found, a piece of it may be removed so it can be examined under a microscope (biopsy). ? If a stone is found, it may be removed through the ureteroscope, or the stone may be broken up using a laser, shock waves, or electrical  energy. ? In some cases, if the ureter is too small, a tube may be inserted that keeps the ureter open (ureteral stent). The stent may be left in place for 1 or 2  weeks to keep the ureter open, and then the ureteroscopy procedure will be performed.  The scope will be removed, and your bladder will be emptied. The procedure may vary among health care providers and hospitals. What happens after the procedure?  Your blood pressure, heart rate, breathing rate, and blood oxygen level will be monitored until the medicines you were given have worn off.  You may be asked to urinate.  Donot drive for 24 hours if you were given a sedative. This information is not intended to replace advice given to you by your health care provider. Make sure you discuss any questions you have with your health care provider. Document Released: 06/16/2013 Document Revised: 03/27/2016 Document Reviewed: 03/23/2016 Elsevier Interactive Patient Education  2018 [employer].  Ureteroscopy, Care After This sheet gives you information about how to care for yourself after your procedure. Your health care provider may also give you more specific instructions. If you have problems or questions, contact your health care provider. What can I expect after the procedure? After the procedure, it is common to have:  A burning sensation when you urinate.  Blood in your urine.  Mild discomfort in the bladder area or kidney area when urinating.  Needing to urinate more often or urgently.  Follow these instructions at home: Medicines  Take over-the-counter and prescription medicines only as told by your health care provider.  If you were prescribed an antibiotic medicine, take it as told by your health care provider. Do not stop taking the antibiotic even if you start to feel better. General instructions   Donot drive for 24 hours if you were given a medicine to help you relax (sedative) during your procedure.  To  relieve burning, try taking a warm bath or holding a warm washcloth over your groin.  Drink enough fluid to keep your urine clear or pale yellow. ? Drink two 8-ounce glasses of water every hour for the first 2 hours after you get home. ? Continue to drink water often at home.  You can eat what you usually do.  Keep all follow-up visits as told by your health care provider. This is important. ? If you had a tube placed to keep urine flowing (ureteral stent), ask your health care provider when you need to return to have it removed. Contact a health care provider if:  You have chills or a fever.  You have burning pain for longer than 24 hours after the procedure.  You have blood in your urine for longer than 24 hours after the procedure. Get help right away if:  You have large amounts of blood in your urine.  You have blood clots in your urine.  You have very bad pain.  You have chest pain or trouble breathing.  You are unable to urinate and you have the feeling of a full bladder. This information is not intended to replace advice given to you by your health care provider. Make sure you discuss any questions you have with your health care provider. Document Released: 06/16/2013 Document Revised: 03/27/2016 Document Reviewed: 03/23/2016 Elsevier Interactive Patient Education  2018 [employer].  Cystoscopy, Care After Refer to this sheet in the next few weeks. These instructions provide you with information about caring for yourself after your procedure. Your health care provider may also give you more specific instructions. Your treatment has been planned according to current medical practices, but problems sometimes occur. Call your health care provider if you have any problems or questions after  your procedure. What can I expect after the procedure? After the procedure, it is common to have:  Mild pain when you urinate. Pain should stop within a few minutes after you urinate.  This may last for up to 1 week.  A small amount of blood in your urine for several days.  Feeling like you need to urinate but producing only a small amount of urine.  Follow these instructions at home:  Medicines  Take over-the-counter and prescription medicines only as told by your health care provider.  If you were prescribed an antibiotic medicine, take it as told by your health care provider. Do not stop taking the antibiotic even if you start to feel better. General instructions   Return to your normal activities as told by your health care provider. Ask your health care provider what activities are safe for you.  Do not drive for 24 hours if you received a sedative.  Watch for any blood in your urine. If the amount of blood in your urine increases, call your health care provider.  Follow instructions from your health care provider about eating or drinking restrictions.  If a tissue sample was removed for testing (biopsy) during your procedure, it is your responsibility to get your test results. Ask your health care provider or the department performing the test when your results will be ready.  Drink enough fluid to keep your urine clear or pale yellow.  Keep all follow-up visits as told by your health care provider. This is important. Contact a health care provider if:  You have pain that gets worse or does not get better with medicine, especially pain when you urinate.  You have difficulty urinating. Get help right away if:  You have more blood in your urine.  You have blood clots in your urine.  You have abdominal pain.  You have a fever or chills.  You are unable to urinate. This information is not intended to replace advice given to you by your health care provider. Make sure you discuss any questions you have with your health care provider. Document Released: 12/29/2004 Document Revised: 11/17/2015 Document Reviewed: 04/28/2015 Elsevier Interactive Patient  Education  [employer].  Cystoscopy Cystoscopy is a procedure that is used to help diagnose and sometimes treat conditions that affect that lower urinary tract. The lower urinary tract includes the bladder and the tube that drains urine from the bladder out of the body (urethra). Cystoscopy is performed with a thin, tube-shaped instrument with a light and camera at the end (cystoscope). The cystoscope may be hard (rigid) or flexible, depending on the goal of the procedure.The cystoscope is inserted through the urethra, into the bladder. Cystoscopy may be recommended if you have:  Urinary tractinfections that keep coming back (recurring).  Blood in the urine (hematuria).  Loss of bladder control (urinary incontinence) or an overactive bladder.  Unusual cells found in a urine sample.  A blockage in the urethra.  Painful urination.  An abnormality in the bladder found during an intravenous pyelogram (IVP) or CT scan.  Cystoscopy may also be done to remove a sample of tissue to be examined under a microscope (biopsy). Tell a health care provider about:  Any allergies you have.  All medicines you are taking, including vitamins, herbs, eye drops, creams, and over-the-counter medicines.  Any problems you or family members have had with anesthetic medicines.  Any blood disorders you have.  Any surgeries you have had.  Any medical conditions you have.  Whether you are pregnant or may be pregnant. What are the risks? Generally, this is a safe procedure. However, problems may occur, including:  Infection.  Bleeding.  Allergic reactions to medicines.  Damage to other structures or organs.  What happens before the procedure?  Ask your health care provider about: ? Changing or stopping your regular medicines. This is especially important if you are taking diabetes medicines or blood thinners. ? Taking medicines such as aspirin and ibuprofen. These medicines can thin your  blood. Do not take these medicines before your procedure if your health care provider instructs you not to.  Follow instructions from your health care provider about eating or drinking restrictions.  You may be given antibiotic medicine to help prevent infection.  You may have an exam or testing, such as X-rays of the bladder, urethra, or kidneys.  You may have urine tests to check for signs of infection.  Plan to have someone take you home after the procedure. What happens during the procedure?  To reduce your risk of infection,your health care team will wash or sanitize their hands.  You will be given one or more of the following: ? A medicine to help you relax (sedative). ? A medicine to numb the area (local anesthetic).  The area around the opening of your urethra will be cleaned.  The cystoscope will be passed through your urethra into your bladder.  Germ-free (sterile)fluid will flow through the cystoscope to fill your bladder. The fluid will stretch your bladder so that your surgeon can clearly examine your bladder walls.  The cystoscope will be removed and your bladder will be emptied. The procedure may vary among health care providers and hospitals. What happens after the procedure?  You may have some soreness or pain in your abdomen and urethra. Medicines will be available to help you.  You may have some blood in your urine.  Do not drive for 24 hours if you received a sedative. This information is not intended to replace advice given to you by your health care provider. Make sure you discuss any questions you have with your health care provider. Document Released: 06/08/2000 Document Revised: 10/20/2015 Document Reviewed: 04/28/2015 Elsevier Interactive Patient Education  2018 [place] Anesthesia, Adult General anesthesia is the use of medicines to make a person "go to sleep" (be unconscious) for a medical procedure. General anesthesia is often  recommended when a procedure:  Is long.  Requires you to be still or in an unusual position.  Is major and can cause you to lose blood.  Is impossible to do without general anesthesia.  The medicines used for general anesthesia are called general anesthetics. In addition to making you sleep, the medicines:  Prevent pain.  Control your blood pressure.  Relax your muscles.  Tell a health care provider about:  Any allergies you have.  All medicines you are taking, including vitamins, herbs, eye drops, creams, and over-the-counter medicines.  Any problems you or family members have had with anesthetic medicines.  Types of anesthetics you have had in the past.  Any bleeding disorders you have.  Any surgeries you have had.  Any medical conditions you have.  Any history of heart or lung conditions, such as heart failure, sleep apnea, or chronic obstructive pulmonary disease (COPD).  Whether you are pregnant or may be pregnant.  Whether you use tobacco, alcohol, marijuana, or street drugs.  Any history of Armed forces logistics/support/administrative officer.  Any history of depression or anxiety. What are  the risks? Generally, this is a safe procedure. However, problems may occur, including:  Allergic reaction to anesthetics.  Lung and heart problems.  Inhaling food or liquids from your stomach into your lungs (aspiration).  Injury to nerves.  Waking up during your procedure and being unable to move (rare).  Extreme agitation or a state of mental confusion (delirium) when you wake up from the anesthetic.  Air in the bloodstream, which can lead to stroke.  These problems are more likely to develop if you are having a major surgery or if you have an advanced medical condition. You can prevent some of these complications by answering all of your health care provider's questions thoroughly and by following all pre-procedure instructions. General anesthesia can cause side effects, including:  Nausea or  vomiting  A sore throat from the breathing tube.  Feeling cold or shivery.  Feeling tired, washed out, or achy.  Sleepiness or drowsiness.  Confusion or agitation.  What happens before the procedure? Staying hydrated Follow instructions from your health care provider about hydration, which may include:  Up to 2 hours before the procedure - you may continue to drink clear liquids, such as water, clear fruit juice, black coffee, and plain tea.  Eating and drinking restrictions Follow instructions from your health care provider about eating and drinking, which may include:  8 hours before the procedure - stop eating heavy meals or foods such as meat, fried foods, or fatty foods.  6 hours before the procedure - stop eating light meals or foods, such as toast or cereal.  6 hours before the procedure - stop drinking milk or drinks that contain milk.  2 hours before the procedure - stop drinking clear liquids.  Medicines  Ask your health care provider about: ? Changing or stopping your regular medicines. This is especially important if you are taking diabetes medicines or blood thinners. ? Taking medicines such as aspirin and ibuprofen. These medicines can thin your blood. Do not take these medicines before your procedure if your health care provider instructs you not to. ? Taking new dietary supplements or medicines. Do not take these during the week before your procedure unless your health care provider approves them.  If you are told to take a medicine or to continue taking a medicine on the day of the procedure, take the medicine with sips of water. General instructions   Ask if you will be going home the same day, the following day, or after a longer hospital stay. ? Plan to have someone take you home. ? Plan to have someone stay with you for the first 24 hours after you leave the hospital or clinic.  For 3-6 weeks before the procedure, try not to use any tobacco products,  such as cigarettes, chewing tobacco, and e-cigarettes.  You may brush your teeth on the morning of the procedure, but make sure to spit out the toothpaste. What happens during the procedure?  You will be given anesthetics through a mask and through an IV tube in one of your veins.  You may receive medicine to help you relax (sedative).  As soon as you are asleep, a breathing tube may be used to help you breathe.  An anesthesia specialist will stay with you throughout the procedure. He or she will help keep you comfortable and safe by continuing to give you medicines and adjusting the amount of medicine that you get. He or she will also watch your blood pressure, pulse, and oxygen levels  to make sure that the anesthetics do not cause any problems.  If a breathing tube was used to help you breathe, it will be removed before you wake up. The procedure may vary among health care providers and hospitals. What happens after the procedure?  You will wake up, often slowly, after the procedure is complete, usually in a recovery area.  Your blood pressure, heart rate, breathing rate, and blood oxygen level will be monitored until the medicines you were given have worn off.  You may be given medicine to help you calm down if you feel anxious or agitated.  If you will be going home the same day, your health care provider may check to make sure you can stand, drink, and urinate.  Your health care providers will treat your pain and side effects before you go home.  Do not drive for 24 hours if you received a sedative.  You may: ? Feel nauseous and vomit. ? Have a sore throat. ? Have mental slowness. ? Feel cold or shivery. ? Feel sleepy. ? Feel tired. ? Feel sore or achy, even in parts of your body where you did not have surgery. This information is not intended to replace advice given to you by your health care provider. Make sure you discuss any questions you have with your health care  provider. Document Released: 09/18/2007 Document Revised: 11/22/2015 Document Reviewed: 05/26/2015 Elsevier Interactive Patient Education  2018 [place] Anesthesia, Adult, Care After These instructions provide you with information about caring for yourself after your procedure. Your health care provider may also give you more specific instructions. Your treatment has been planned according to current medical practices, but problems sometimes occur. Call your health care provider if you have any problems or questions after your procedure. What can I expect after the procedure? After the procedure, it is common to have:  Vomiting.  A sore throat.  Mental slowness.  It is common to feel:  Nauseous.  Cold or shivery.  Sleepy.  Tired.  Sore or achy, even in parts of your body where you did not have surgery.  Follow these instructions at home: For at least 24 hours after the procedure:  Do not: ? Participate in activities where you could fall or become injured. ? Drive. ? Use heavy machinery. ? Drink alcohol. ? Take sleeping pills or medicines that cause drowsiness. ? Make important decisions or sign legal documents. ? Take care of children on your own.  Rest. Eating and drinking  If you vomit, drink water, juice, or soup when you can drink without vomiting.  Drink enough fluid to keep your urine clear or pale yellow.  Make sure you have little or no nausea before eating solid foods.  Follow the diet recommended by your health care provider. General instructions  Have a responsible adult stay with you until you are awake and alert.  Return to your normal activities as told by your health care provider. Ask your health care provider what activities are safe for you.  Take over-the-counter and prescription medicines only as told by your health care provider.  If you smoke, do not smoke without supervision.  Keep all follow-up visits as told by your  health care provider. This is important. Contact a health care provider if:  You continue to have nausea or vomiting at home, and medicines are not helpful.  You cannot drink fluids or start eating again.  You cannot urinate after 8-12 hours.  You develop a skin  rash.  You have fever.  You have increasing redness at the site of your procedure. Get help right away if:  You have difficulty breathing.  You have chest pain.  You have unexpected bleeding.  You feel that you are having a life-threatening or urgent problem. This information is not intended to replace advice given to you by your health care provider. Make sure you discuss any questions you have with your health care provider. Document Released: 09/17/2000 Document Revised: 11/14/2015 Document Reviewed: 05/26/2015 Elsevier Interactive Patient Education  [employer].

## 2017-08-20 NOTE — Telephone Encounter (Signed)
   [facility] Pre-operative Risk Assessment    Request for surgical clearance:  1. What type of surgery is being performed? Cysto, bilateral retrograde pyeligram right ureterscopy, possible tumor ablation transurethral resection of bladder tumor   2. When is this surgery scheduled? tbd   3. What type of clearance is required (medical clearance vs. Pharmacy clearance to hold med vs. Both)? both  4. Are there any medications that need to be held prior to surgery and how long?coumadin 5 days   5. Practice name and name of physician performing surgery? [facility]  6. What is your office phone and fax number? [phone]   7. Anesthesia type (None, local, MAC, general) ? unknown   [name] 08/20/2017, 5:18 PM  _________________________________________________________________   (provider comments below)

## 2017-08-22 ENCOUNTER — Encounter (HOSPITAL_COMMUNITY)
Admission: RE | Admit: 2017-08-22 | Discharge: 2017-08-22 | Disposition: A | Discharge status: Home / Self Care | Payer: Medicare Other | Source: Ambulatory Visit | Attending: Urology | Admitting: Urology

## 2017-08-22 ENCOUNTER — Other Ambulatory Visit: Payer: Self-pay

## 2017-08-22 ENCOUNTER — Encounter (HOSPITAL_COMMUNITY): Payer: Self-pay

## 2017-08-22 DIAGNOSIS — Z01812 Encounter for preprocedural laboratory examination: Secondary | ICD-10-CM | POA: Insufficient documentation

## 2017-08-22 LAB — CBC WITH DIFFERENTIAL/PLATELET
BASOS ABS: 0 10*3/uL (ref 0.0–0.1)
BASOS PCT: 0 %
EOS ABS: 0.2 10*3/uL (ref 0.0–0.7)
EOS PCT: 3 %
HCT: 42.3 % (ref 39.0–52.0)
Hemoglobin: 13.6 g/dL (ref 13.0–17.0)
Lymphocytes Relative: 19 %
Lymphs Abs: 1.6 10*3/uL (ref 0.7–4.0)
MCH: 30.6 pg (ref 26.0–34.0)
MCHC: 32.2 g/dL (ref 30.0–36.0)
MCV: 95.3 fL (ref 78.0–100.0)
Monocytes Absolute: 0.9 10*3/uL (ref 0.1–1.0)
Monocytes Relative: 11 %
Neutro Abs: 5.7 10*3/uL (ref 1.7–7.7)
Neutrophils Relative %: 67 %
PLATELETS: 179 10*3/uL (ref 150–400)
RBC: 4.44 MIL/uL (ref 4.22–5.81)
RDW: 14.2 % (ref 11.5–15.5)
WBC: 8.4 10*3/uL (ref 4.0–10.5)

## 2017-08-22 LAB — BASIC METABOLIC PANEL
ANION GAP: 9 (ref 5–15)
BUN: 24 mg/dL — ABNORMAL HIGH (ref 6–20)
CALCIUM: 8.9 mg/dL (ref 8.9–10.3)
CO2: 23 mmol/L (ref 22–32)
Chloride: 104 mmol/L (ref 101–111)
Creatinine, Ser: 1.31 mg/dL — ABNORMAL HIGH (ref 0.61–1.24)
GFR, EST AFRICAN AMERICAN: 57 mL/min — AB (ref >60)
GFR, EST NON AFRICAN AMERICAN: 49 mL/min — AB (ref >60)
GLUCOSE: 123 mg/dL — AB (ref 65–99)
Potassium: 3.8 mmol/L (ref 3.5–5.1)
SODIUM: 136 mmol/L (ref 135–145)

## 2017-08-22 NOTE — Telephone Encounter (Signed)
   Primary Cardiologist:Suresh Bronson Ing, MD  Chart reviewed as part of pre-operative protocol coverage. Left Voice mail to call between pre-op hours. Will route to pharmacy to review anticoagulation.    [place], [place]  08/22/2017, 2:22 PM

## 2017-08-23 NOTE — Telephone Encounter (Signed)
   Primary Cardiologist: Kate Sable, MD  Chart reviewed as part of pre-operative protocol coverage. Patient was contacted 08/23/2017 in reference to pre-operative risk assessment for pending surgery as outlined below.  [name] was last seen on 04/11/2018 by Dr. Bronson Ing.  Since that day, [name] has done well. Per patient, he began to hold coumadin on 08/20/2017  Therefore, based on ACC/AHA guidelines, the patient would be at acceptable risk for the planned procedure without further cardiovascular testing.   I will route this recommendation to the requesting party via Epic fax function and remove from pre-op pool.  Please call with questions.  [place], [place] 08/23/2017, 2:42 PM

## 2017-08-23 NOTE — Telephone Encounter (Signed)
Pt takes Coumadin for afib/aflutter with CHADS2VASc score of 3 (age x2, HTN). Ok to hold warfarin for 5 days prior to procedure.

## 2017-08-26 ENCOUNTER — Ambulatory Visit (HOSPITAL_COMMUNITY)
Admission: RE | Admit: 2017-08-26 | Discharge: 2017-08-26 | Disposition: A | Discharge status: Home / Self Care | Payer: Medicare Other | Source: Ambulatory Visit | Attending: Urology | Admitting: Urology

## 2017-08-26 ENCOUNTER — Ambulatory Visit (HOSPITAL_COMMUNITY): Payer: Medicare Other | Admitting: Anesthesiology

## 2017-08-26 ENCOUNTER — Encounter (HOSPITAL_COMMUNITY)
Admission: RE | Disposition: A | Discharge status: Home / Self Care | Payer: Self-pay | Source: Ambulatory Visit | Attending: Urology

## 2017-08-26 ENCOUNTER — Ambulatory Visit (HOSPITAL_COMMUNITY): Payer: Medicare Other

## 2017-08-26 ENCOUNTER — Encounter (HOSPITAL_COMMUNITY): Payer: Self-pay

## 2017-08-26 DIAGNOSIS — I110 Hypertensive heart disease with heart failure: Secondary | ICD-10-CM | POA: Insufficient documentation

## 2017-08-26 DIAGNOSIS — Z882 Allergy status to sulfonamides status: Secondary | ICD-10-CM | POA: Diagnosis not present

## 2017-08-26 DIAGNOSIS — N135 Crossing vessel and stricture of ureter without hydronephrosis: Secondary | ICD-10-CM | POA: Insufficient documentation

## 2017-08-26 DIAGNOSIS — I4892 Unspecified atrial flutter: Secondary | ICD-10-CM | POA: Insufficient documentation

## 2017-08-26 DIAGNOSIS — Z7901 Long term (current) use of anticoagulants: Secondary | ICD-10-CM | POA: Insufficient documentation

## 2017-08-26 DIAGNOSIS — I4891 Unspecified atrial fibrillation: Secondary | ICD-10-CM | POA: Insufficient documentation

## 2017-08-26 DIAGNOSIS — R0602 Shortness of breath: Secondary | ICD-10-CM | POA: Insufficient documentation

## 2017-08-26 DIAGNOSIS — I509 Heart failure, unspecified: Secondary | ICD-10-CM | POA: Diagnosis not present

## 2017-08-26 DIAGNOSIS — N131 Hydronephrosis with ureteral stricture, not elsewhere classified: Secondary | ICD-10-CM | POA: Diagnosis not present

## 2017-08-26 DIAGNOSIS — I429 Cardiomyopathy, unspecified: Secondary | ICD-10-CM | POA: Insufficient documentation

## 2017-08-26 DIAGNOSIS — N1330 Unspecified hydronephrosis: Secondary | ICD-10-CM | POA: Diagnosis present

## 2017-08-26 DIAGNOSIS — Z880 Allergy status to penicillin: Secondary | ICD-10-CM | POA: Diagnosis not present

## 2017-08-26 DIAGNOSIS — Z96651 Presence of right artificial knee joint: Secondary | ICD-10-CM | POA: Diagnosis not present

## 2017-08-26 HISTORY — PX: CYSTOSCOPY/RETROGRADE/URETEROSCOPY: SHX5316

## 2017-08-26 SURGERY — CYSTOSCOPY/RETROGRADE/URETEROSCOPY
Anesthesia: General | Laterality: Right

## 2017-08-26 MED ORDER — STERILE WATER FOR IRRIGATION IR SOLN
Status: DC | PRN
  Administered 2017-08-26: 3000 mL
  Administered 2017-08-26: 1000 mL

## 2017-08-26 MED ORDER — MIDAZOLAM HCL 5 MG/5ML IJ SOLN
INTRAMUSCULAR | Status: DC | PRN
  Administered 2017-08-26: 1 mg via INTRAVENOUS

## 2017-08-26 MED ORDER — CEFAZOLIN SODIUM-DEXTROSE 2-4 GM/100ML-% IV SOLN
2.0000 g | INTRAVENOUS | Status: AC
  Administered 2017-08-26: 2 g via INTRAVENOUS
  Filled 2017-08-26: qty 100

## 2017-08-26 MED ORDER — MIDAZOLAM HCL 2 MG/2ML IJ SOLN
INTRAMUSCULAR | Status: AC
  Filled 2017-08-26: qty 2

## 2017-08-26 MED ORDER — PROPOFOL 10 MG/ML IV BOLUS
INTRAVENOUS | Status: DC | PRN
  Administered 2017-08-26: 100 mg via INTRAVENOUS

## 2017-08-26 MED ORDER — FENTANYL CITRATE (PF) 100 MCG/2ML IJ SOLN
INTRAMUSCULAR | Status: DC | PRN
  Administered 2017-08-26: 25 ug via INTRAVENOUS

## 2017-08-26 MED ORDER — FENTANYL CITRATE (PF) 100 MCG/2ML IJ SOLN: 25.0000 ug | INTRAMUSCULAR | Status: DC | PRN

## 2017-08-26 MED ORDER — DIATRIZOATE MEGLUMINE 30 % UR SOLN
URETHRAL | Status: DC | PRN
  Administered 2017-08-26: 9 mL via URETHRAL

## 2017-08-26 MED ORDER — MIDAZOLAM HCL 2 MG/2ML IJ SOLN
1.0000 mg | INTRAMUSCULAR | Status: AC
  Administered 2017-08-26: 2 mg via INTRAVENOUS

## 2017-08-26 MED ORDER — SODIUM CHLORIDE 0.9 % IR SOLN
Status: DC | PRN
  Administered 2017-08-26: 3000 mL

## 2017-08-26 MED ORDER — LACTATED RINGERS IV SOLN
INTRAVENOUS | Status: DC
  Administered 2017-08-26: 10:00:00 via INTRAVENOUS

## 2017-08-26 MED ORDER — EPHEDRINE SULFATE 50 MG/ML IJ SOLN
INTRAMUSCULAR | Status: DC | PRN
  Administered 2017-08-26: 5 mg via INTRAVENOUS

## 2017-08-26 MED ORDER — FENTANYL CITRATE (PF) 100 MCG/2ML IJ SOLN
INTRAMUSCULAR | Status: AC
  Filled 2017-08-26: qty 2

## 2017-08-26 MED ORDER — TRAMADOL HCL 50 MG PO TABS: 50.0000 mg | ORAL_TABLET | Freq: Four times a day (QID) | ORAL | 0 refills | Status: DC | PRN

## 2017-08-26 MED ORDER — DIATRIZOATE MEGLUMINE 30 % UR SOLN
URETHRAL | Status: AC
  Filled 2017-08-26: qty 100

## 2017-08-26 SURGICAL SUPPLY — 36 items
BAG DRAIN URO TABLE W/ADPT NS (DRAPE) ×4 IMPLANT
BAG DRN 8 ADPR NS SKTRN CSTL (DRAPE) ×2
BAG HAMPER (MISCELLANEOUS) ×4 IMPLANT
BAG URINE DRAINAGE (UROLOGICAL SUPPLIES) ×4 IMPLANT
CATH FOLEY 3WAY 30CC 22F (CATHETERS) IMPLANT
CATH FOLEY LATEX FREE 22FR (CATHETERS)
CATH FOLEY LF 22FR (CATHETERS) IMPLANT
CATH INTERMIT  6FR 70CM (CATHETERS) ×4 IMPLANT
CLOTH BEACON ORANGE TIMEOUT ST (SAFETY) ×4 IMPLANT
ELECT LOOP 22F BIPOLAR SML (ELECTROSURGICAL) ×4
ELECT REM PT RETURN 9FT ADLT (ELECTROSURGICAL) ×4
ELECTRODE LOOP 22F BIPOLAR SML (ELECTROSURGICAL) ×2 IMPLANT
ELECTRODE REM PT RTRN 9FT ADLT (ELECTROSURGICAL) ×2 IMPLANT
EXTRACTOR STONE NITINOL NGAGE (UROLOGICAL SUPPLIES) IMPLANT
GLOVE BIO SURGEON STRL SZ8 (GLOVE) ×4 IMPLANT
GLOVE BIOGEL PI IND STRL 7.0 (GLOVE) ×3 IMPLANT
GLOVE BIOGEL PI INDICATOR 7.0 (GLOVE) ×4
GLOVE ECLIPSE 6.5 STRL STRAW (GLOVE) ×3 IMPLANT
GOWN STRL REUS W/ TWL LRG LVL3 (GOWN DISPOSABLE) ×2 IMPLANT
GOWN STRL REUS W/ TWL XL LVL3 (GOWN DISPOSABLE) ×2 IMPLANT
GOWN STRL REUS W/TWL LRG LVL3 (GOWN DISPOSABLE) ×4
GOWN STRL REUS W/TWL XL LVL3 (GOWN DISPOSABLE) ×8 IMPLANT
GUIDEWIRE STR DUAL SENSOR (WIRE) ×4 IMPLANT
GUIDEWIRE STR ZIPWIRE 035X150 (MISCELLANEOUS) ×4 IMPLANT
IV NS IRRIG 3000ML ARTHROMATIC (IV SOLUTION) ×5 IMPLANT
KIT ROOM TURNOVER AP CYSTO (KITS) ×4 IMPLANT
MANIFOLD NEPTUNE II (INSTRUMENTS) ×4 IMPLANT
PACK CYSTO (CUSTOM PROCEDURE TRAY) ×4 IMPLANT
PAD ARMBOARD 7.5X6 YLW CONV (MISCELLANEOUS) ×4 IMPLANT
PLUG CATH AND CAP STER (CATHETERS) ×4 IMPLANT
STENT URET 6FRX26 CONTOUR (STENTS) ×3 IMPLANT
SYR 30ML LL (SYRINGE) ×4 IMPLANT
SYRINGE IRR TOOMEY STRL 70CC (SYRINGE) IMPLANT
TOWEL OR 17X26 4PK STRL BLUE (TOWEL DISPOSABLE) ×4 IMPLANT
WATER STERILE IRR 1000ML POUR (IV SOLUTION) ×3 IMPLANT
WATER STERILE IRR 3000ML UROMA (IV SOLUTION) ×3 IMPLANT

## 2017-08-26 NOTE — Discharge Instructions (Signed)
Cystoscopy, Care After Refer to this sheet in the next few weeks. These instructions provide you with information about caring for yourself after your procedure. Your health care provider may also give you more specific instructions. Your treatment has been planned according to current medical practices, but problems sometimes occur. Call your health care provider if you have any problems or questions after your procedure. What can I expect after the procedure? After the procedure, it is common to have:  Mild pain when you urinate. Pain should stop within a few minutes after you urinate. This may last for up to 1 week.  A small amount of blood in your urine for several days.  Feeling like you need to urinate but producing only a small amount of urine.  Follow these instructions at home:  Medicines  Take over-the-counter and prescription medicines only as told by your health care provider.  If you were prescribed an antibiotic medicine, take it as told by your health care provider. Do not stop taking the antibiotic even if you start to feel better. General instructions   Return to your normal activities as told by your health care provider. Ask your health care provider what activities are safe for you.  Do not drive for 24 hours if you received a sedative.  Watch for any blood in your urine. If the amount of blood in your urine increases, call your health care provider.  Follow instructions from your health care provider about eating or drinking restrictions.  If a tissue sample was removed for testing (biopsy) during your procedure, it is your responsibility to get your test results. Ask your health care provider or the department performing the test when your results will be ready.  Drink enough fluid to keep your urine clear or pale yellow.  Keep all follow-up visits as told by your health care provider. This is important. Contact a health care provider if:  You have pain that  gets worse or does not get better with medicine, especially pain when you urinate.  You have difficulty urinating. Get help right away if:  You have more blood in your urine.  You have blood clots in your urine.  You have abdominal pain.  You have a fever or chills.  You are unable to urinate. This information is not intended to replace advice given to you by your health care provider. Make sure you discuss any questions you have with your health care provider. Document Released: 12/29/2004 Document Revised: 11/17/2015 Document Reviewed: 04/28/2015 Elsevier Interactive Patient Education  2018 [place]. Ureteral Stent Implantation, Care After Refer to this sheet in the next few weeks. These instructions provide you with information about caring for yourself after your procedure. Your health care provider may also give you more specific instructions. Your treatment has been planned according to current medical practices, but problems sometimes occur. Call your health care provider if you have any problems or questions after your procedure. What can I expect after the procedure? After the procedure, it is common to have:  Nausea.  Mild pain when you urinate. You may feel this pain in your lower back or lower abdomen. Pain should stop within a few minutes after you urinate. This may last for up to 1 week.  A small amount of blood in your urine for several days.  Follow these instructions at home:  Medicines  Take over-the-counter and prescription medicines only as told by your health care provider.  If you were prescribed an antibiotic  medicine, take it as told by your health care provider. Do not stop taking the antibiotic even if you start to feel better.  Do not drive for 24 hours if you received a sedative.  Do not drive or operate heavy machinery while taking prescription pain medicines. Activity  Return to your normal activities as told by your health care provider.  Ask your health care provider what activities are safe for you.  Do not lift anything that is heavier than 10 lb (4.5 kg). Follow this limit for 1 week after your procedure, or for as long as told by your health care provider. General instructions  Watch for any blood in your urine. Call your health care provider if the amount of blood in your urine increases.  If you have a catheter: ? Follow instructions from your health care provider about taking care of your catheter and collection bag. ? Do not take baths, swim, or use a hot tub until your health care provider approves.  Drink enough fluid to keep your urine clear or pale yellow.  Keep all follow-up visits as told by your health care provider. This is important. Contact a health care provider if:  You have pain that gets worse or does not get better with medicine, especially pain when you urinate.  You have difficulty urinating.  You feel nauseous or you vomit repeatedly during a period of more than 2 days after the procedure. Get help right away if:  Your urine is dark red or has blood clots in it.  You are leaking urine (have incontinence).  The end of the stent comes out of your urethra.  You cannot urinate.  You have sudden, sharp, or severe pain in your abdomen or lower back.  You have a fever. This information is not intended to replace advice given to you by your health care provider. Make sure you discuss any questions you have with your health care provider. Document Released: 02/11/2013 Document Revised: 11/17/2015 Document Reviewed: 12/24/2014 Elsevier Interactive Patient Education  [employer].

## 2017-08-26 NOTE — H&P (Signed)
Urology Admission H&P  Chief Complaint: hematuria and right flank pain  History of Present Illness: [name] is a 84yo with a hx of gross hematuria who was found to have a bladder tumor on cystoscopy. He has right ureteral obstruction from his tumor. Mild right flank pain. No LUTS  Past Medical History:  Diagnosis Date  . Atrial flutter, paroxysmal ([place])    a. Hx of TEE with LAA clots. b. s/p ESP/RFCA of atrial flutter 08/2008. b. Recurrence of A-flutter after ablation/atypical, requiring DCCV and amiodaorone.  [name] BPH (benign prostatic hypertrophy)   . Cardiomyopathy ([place])    a. Prior EF 30-35%, felt tachycardia induced cardiomyopathyy. b. Improved after cardioversion and restoration of normal sinus rhythm - EF 65-70% by echo 10/13  . Essential hypertension, benign   . H/O amiodarone therapy    Used for atrial flutter after return of flutter after ablation  . Shortness of breath for last year   when pt lies down   . Urination, excessive at night   . Warfarin anticoagulation    For atrial flutter   Past Surgical History:  Procedure Laterality Date  . FEMORAL HERNIA REPAIR     x2  . TOTAL KNEE ARTHROPLASTY Right 2004  . TRANSURETHRAL PROSTATECTOMY WITH GYRUS INSTRUMENTS N/A 03/12/2013   Procedure: TRANSURETHRAL PROSTATECTOMY WITH GYRUS INSTRUMENTS;  Surgeon: Ailene Rud, MD;  Location: [facility];  Service: Urology;  Laterality: N/A;  . TURP VAPORIZATION    . UMBILICAL HERNIA REPAIR      Home Medications:  Current Facility-Administered Medications  Medication Dose Route Frequency Provider Last Rate Last Dose  . ceFAZolin (ANCEF) IVPB 2g/100 mL premix  2 g Intravenous 30 min Pre-Op Marquesha Robideau, Candee Furbish, MD      . lactated ringers infusion   Intravenous Continuous Lerry Liner, MD 75 mL/hr at 08/26/17 1003     Allergies:  Allergies  Allergen Reactions  . Penicillins Rash    Has patient had a PCN reaction causing immediate rash, facial/tongue/throat swelling, SOB or  lightheadedness with hypotension: Yes Has patient had a PCN reaction causing severe rash involving mucus membranes or skin necrosis: No Has patient had a PCN reaction that required hospitalization: No Has patient had a PCN reaction occurring within the last 10 years: No If all of the above answers are "NO", then may proceed with Cephalosporin use.   . Sulfonamide Derivatives Rash    History reviewed. No pertinent family history. Social History:  reports that  has never smoked. he has never used smokeless tobacco. He reports that he does not drink alcohol or use drugs.  Review of Systems  Genitourinary: Positive for hematuria.  All other systems reviewed and are negative.   Physical Exam:  Vital signs in last 24 hours: Temp:  [98.1 F (36.7 C)] 98.1 F (36.7 C) (03/04 0855) Pulse Rate:  [85] 85 (03/04 0855) Resp:  [10-23] 10 (03/04 1000) BP: (119-164)/(66-97) 142/88 (03/04 1000) SpO2:  [93 %-97 %] 96 % (03/04 1000) Physical Exam  Constitutional: He is oriented to person, place, and time. He appears well-developed and well-nourished.  HENT:  Head: Normocephalic and atraumatic.  Eyes: EOM are normal. Pupils are equal, round, and reactive to light.  Neck: Normal range of motion. No thyromegaly present.  Cardiovascular: Normal rate and regular rhythm.  Respiratory: Effort normal. No respiratory distress.  GI: Soft. He exhibits no distension.  Musculoskeletal: Normal range of motion. He exhibits no edema.  Neurological: He is alert and oriented to person, place, and  time.  Skin: Skin is warm and dry.  Psychiatric: He has a normal mood and affect. His behavior is normal. Judgment and thought content normal.    Laboratory Data:  No results found for this or any previous visit (from the past 24 hour(s)). No results found for this or any previous visit (from the past 240 hour(s)). Creatinine: Recent Labs    08/22/17 1330  CREATININE 1.31*   Baseline Creatinine:  1.3  Impression/Assessment:  83yo with bladder tumor and right hydronephrosis  Plan:  The risks/benefits/alternatives to TURBT, bilateral retrogrades and possible right ureteral stent placement was explained to the patient and he understands and wishes to proceed with surgery  Nicolette Bang 08/26/2017, 10:24 AM

## 2017-08-26 NOTE — Op Note (Signed)
Preoperative diagnosis: Right hydronephrosis, bladder mass  Postoperative diagnosis: right distal ureteral stricture  Procedure: 1 cystoscopy 2.  right retrograde pyelography 3.  Intraoperative fluoroscopy, under one hour, with interpretation 4.  Right diagnostic ureteroscopy 5. Right 6x26 JJ ureteral stent placement  Attending: Nicolette Bang  Anesthesia: General  Estimated blood loss: None  Drains: rigth 6x26 JJ ureteral stent  Specimens: none  Antibiotics: ancef  Findings: Normal right retrograde pyelogram. No tumors or stone visualized in the bladder. 4 wide bore distal ureteral strictures.  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  After discussing treatment options, she decided proceed with right diagnostic ureteroscopy  Procedure her in detail: The patient was brought to the operating room and a brief timeout was done to ensure correct patient, correct procedure, correct site.  General anesthesia was administered patient was placed in dorsal lithotomy position.  Her genitalia was then prepped and draped in usual sterile fashion.  A rigid 70 French cystoscope was passed in the urethra and the bladder.  Bladder was inspected free masses or lesions.  the right ureteral orifices were in the normal orthotopic locations.  a 6 french ureteral catheter was then instilled into the right ureter orifice.  a gentle retrograde was obtained and findings noted above.  we then placed a zip wire through the ureteral catheter and advanced up to the renal pelvis.  we then removed the cystoscope and cannulated the right ureteral orifice with a semirigid ureteroscope.  we then performed ureteroscopy up to the level of the UPJ. No stone or tumor was encountered. We dis encounter multiple large bore ureteral strictures. After ureteroscopy we elected to place a stent over the original zip wire. A 6x26 JJ ureteral stent was  advanced up the zipwire and to the renal pelvis. The wire was removed and a good coil was noted in the renal pelvis under fluoroscopy and the bladder undr direct vision.  the bladder was then drained and this concluded the procedure which was well tolerated by patient.  Complications: None  Condition: Stable, extubated, transferred to PACU  Plan: Pt is to followup in 1 week for stent removal

## 2017-08-26 NOTE — Transfer of Care (Signed)
Immediate Anesthesia Transfer of Care Note  Patient: [name]  Procedure(s) Performed: CYSTOSCOPY/ RIGHT RETROGRADE PYELOGRAM/ RIGHT URETEROSCOPY WITH STENT PLACEMENT (Right Ureter)  Patient Location: PACU  Anesthesia Type:General  Level of Consciousness: awake and patient cooperative  Airway & Oxygen Therapy: Patient Spontanous Breathing and Patient connected to face mask oxygen  Post-op Assessment: Report given to RN, Post -op Vital signs reviewed and stable and Patient moving all extremities  Post vital signs: Reviewed and stable  Last Vitals:  Vitals:   08/26/17 1040 08/26/17 1045  BP: 139/86 131/84  Pulse:    Resp: 13 14  Temp:    SpO2:      Last Pain:  Vitals:   08/26/17 0855  TempSrc: Oral      Patients Stated Pain Goal: 6 (51/10/21 1173)  Complications: No apparent anesthesia complications

## 2017-08-26 NOTE — Anesthesia Postprocedure Evaluation (Signed)
Anesthesia Post Note  Patient: [name]  Procedure(s) Performed: CYSTOSCOPY/ RIGHT RETROGRADE PYELOGRAM/ RIGHT URETEROSCOPY WITH STENT PLACEMENT (Right Ureter)  Patient location during evaluation: PACU Anesthesia Type: General Level of consciousness: awake and alert and patient cooperative Pain management: pain level controlled Vital Signs Assessment: post-procedure vital signs reviewed and stable Respiratory status: spontaneous breathing, nonlabored ventilation and respiratory function stable Cardiovascular status: blood pressure returned to baseline Postop Assessment: no apparent nausea or vomiting Anesthetic complications: no     Last Vitals:  Vitals:   08/26/17 1040 08/26/17 1045  BP: 139/86 131/84  Pulse:    Resp: 13 14  Temp:    SpO2:      Last Pain:  Vitals:   08/26/17 0855  TempSrc: Oral                 Josue Kass J

## 2017-08-26 NOTE — Anesthesia Preprocedure Evaluation (Addendum)
Anesthesia Evaluation  Patient identified by MRN, date of birth, ID band Patient awake    Reviewed: Allergy & Precautions, NPO status , Patient's Chart, lab work & pertinent test results  Airway Mallampati: III  TM Distance: >3 FB Neck ROM: Full    Dental  (+) Teeth Intact   Pulmonary shortness of breath and with exertion,    breath sounds clear to auscultation       Cardiovascular hypertension, Pt. on medications + CAD, +CHF, + Orthopnea and + DOE  + dysrhythmias Atrial Fibrillation  Rhythm:Regular Rate:Normal     Neuro/Psych negative neurological ROS     GI/Hepatic negative GI ROS, Neg liver ROS,   Endo/Other  negative endocrine ROS  Renal/GU      Musculoskeletal   Abdominal   Peds  Hematology   Anesthesia Other Findings   Reproductive/Obstetrics                             Anesthesia Physical Anesthesia Plan  ASA: III  Anesthesia Plan: General   Post-op Pain Management:    Induction: Intravenous  PONV Risk Score and Plan:   Airway Management Planned: LMA  Additional Equipment:   Intra-op Plan:   Post-operative Plan: Extubation in OR  Informed Consent: I have reviewed the patients History and Physical, chart, labs and discussed the procedure including the risks, benefits and alternatives for the proposed anesthesia with the patient or authorized representative who has indicated his/her understanding and acceptance.     Plan Discussed with:   Anesthesia Plan Comments:         Anesthesia Quick Evaluation

## 2017-08-27 ENCOUNTER — Encounter (HOSPITAL_COMMUNITY): Payer: Self-pay | Admitting: Urology

## 2017-09-04 ENCOUNTER — Ambulatory Visit: Payer: Medicare Other | Admitting: Urology

## 2017-09-06 ENCOUNTER — Ambulatory Visit: Payer: Medicare Other | Admitting: Urology

## 2017-09-06 DIAGNOSIS — N130 Hydronephrosis with ureteropelvic junction obstruction: Secondary | ICD-10-CM

## 2017-09-06 DIAGNOSIS — N135 Crossing vessel and stricture of ureter without hydronephrosis: Secondary | ICD-10-CM

## 2017-09-26 ENCOUNTER — Ambulatory Visit (INDEPENDENT_AMBULATORY_CARE_PROVIDER_SITE_OTHER): Payer: Medicare Other | Admitting: *Deleted

## 2017-09-26 DIAGNOSIS — Z5181 Encounter for therapeutic drug level monitoring: Secondary | ICD-10-CM | POA: Diagnosis not present

## 2017-09-26 DIAGNOSIS — I4892 Unspecified atrial flutter: Secondary | ICD-10-CM

## 2017-09-26 LAB — POCT INR: INR: 2.5

## 2017-09-26 NOTE — Patient Instructions (Signed)
Continue coumadin 1 tablet daily except 1 1/2 tablets on Tuesdays and Saturdays Recheck in 4 weeks 

## 2017-10-08 ENCOUNTER — Other Ambulatory Visit: Payer: Self-pay | Admitting: Cardiovascular Disease

## 2017-10-09 ENCOUNTER — Other Ambulatory Visit: Payer: Self-pay | Admitting: Urology

## 2017-10-09 ENCOUNTER — Ambulatory Visit: Payer: Medicare Other | Admitting: Urology

## 2017-10-09 DIAGNOSIS — N135 Crossing vessel and stricture of ureter without hydronephrosis: Secondary | ICD-10-CM

## 2017-10-09 DIAGNOSIS — N130 Hydronephrosis with ureteropelvic junction obstruction: Secondary | ICD-10-CM

## 2017-10-16 ENCOUNTER — Ambulatory Visit (HOSPITAL_COMMUNITY): Admission: RE | Admit: 2017-10-16 | Payer: Medicare Other | Source: Ambulatory Visit

## 2017-10-23 ENCOUNTER — Ambulatory Visit (HOSPITAL_COMMUNITY)
Admission: RE | Admit: 2017-10-23 | Discharge: 2017-10-23 | Disposition: A | Discharge status: Home / Self Care | Payer: Medicare Other | Source: Ambulatory Visit | Attending: Urology | Admitting: Urology

## 2017-10-23 ENCOUNTER — Ambulatory Visit: Payer: Medicare Other | Admitting: Urology

## 2017-10-23 DIAGNOSIS — N131 Hydronephrosis with ureteral stricture, not elsewhere classified: Secondary | ICD-10-CM | POA: Insufficient documentation

## 2017-10-23 DIAGNOSIS — N281 Cyst of kidney, acquired: Secondary | ICD-10-CM | POA: Diagnosis not present

## 2017-10-23 DIAGNOSIS — R351 Nocturia: Secondary | ICD-10-CM | POA: Diagnosis not present

## 2017-10-23 DIAGNOSIS — N135 Crossing vessel and stricture of ureter without hydronephrosis: Secondary | ICD-10-CM

## 2017-10-23 DIAGNOSIS — N130 Hydronephrosis with ureteropelvic junction obstruction: Secondary | ICD-10-CM

## 2017-10-24 ENCOUNTER — Ambulatory Visit (INDEPENDENT_AMBULATORY_CARE_PROVIDER_SITE_OTHER): Payer: Medicare Other | Admitting: *Deleted

## 2017-10-24 DIAGNOSIS — I4892 Unspecified atrial flutter: Secondary | ICD-10-CM | POA: Diagnosis not present

## 2017-10-24 DIAGNOSIS — Z5181 Encounter for therapeutic drug level monitoring: Secondary | ICD-10-CM

## 2017-10-24 LAB — POCT INR: INR: 2.1

## 2017-10-24 NOTE — Patient Instructions (Signed)
Continue coumadin 1 tablet daily except 1 1/2 tablets on Tuesdays and Saturdays Recheck in 4 weeks 

## 2017-12-04 ENCOUNTER — Ambulatory Visit: Payer: Medicare Other | Admitting: Urology

## 2017-12-04 DIAGNOSIS — N3941 Urge incontinence: Secondary | ICD-10-CM | POA: Diagnosis not present

## 2017-12-04 DIAGNOSIS — R351 Nocturia: Secondary | ICD-10-CM

## 2017-12-04 DIAGNOSIS — N131 Hydronephrosis with ureteral stricture, not elsewhere classified: Secondary | ICD-10-CM

## 2017-12-19 ENCOUNTER — Ambulatory Visit: Payer: Medicare Other | Admitting: Cardiovascular Disease

## 2017-12-19 ENCOUNTER — Encounter: Payer: Self-pay | Admitting: Cardiovascular Disease

## 2017-12-19 ENCOUNTER — Ambulatory Visit (INDEPENDENT_AMBULATORY_CARE_PROVIDER_SITE_OTHER): Payer: Medicare Other | Admitting: *Deleted

## 2017-12-19 VITALS — BP 118/68 | HR 88 | Ht 70.0 in | Wt 214.0 lb

## 2017-12-19 DIAGNOSIS — I10 Essential (primary) hypertension: Secondary | ICD-10-CM

## 2017-12-19 DIAGNOSIS — R42 Dizziness and giddiness: Secondary | ICD-10-CM | POA: Diagnosis not present

## 2017-12-19 DIAGNOSIS — I4892 Unspecified atrial flutter: Secondary | ICD-10-CM | POA: Diagnosis not present

## 2017-12-19 DIAGNOSIS — Z5181 Encounter for therapeutic drug level monitoring: Secondary | ICD-10-CM

## 2017-12-19 DIAGNOSIS — I4891 Unspecified atrial fibrillation: Secondary | ICD-10-CM | POA: Diagnosis not present

## 2017-12-19 DIAGNOSIS — R55 Syncope and collapse: Secondary | ICD-10-CM | POA: Diagnosis not present

## 2017-12-19 LAB — POCT INR: INR: 2.1 (ref 2.0–3.0)

## 2017-12-19 NOTE — Patient Instructions (Signed)
Continue coumadin 1 tablet daily except 1 1/2 tablets on Tuesdays and Saturdays Recheck in 6 weeks 

## 2017-12-19 NOTE — Patient Instructions (Addendum)
Your physician wants you to follow-up in: 3 months with [place]     Your physician recommends that you continue on your current medications as directed. Please refer to the Current Medication list given to you today.    If you need a refill on your cardiac medications before your next appointment, please call your pharmacy.     No lab work or tests today     Your INR is 2.1 lisa reid  Will call you for instructions     Thank you for choosing [facility] !

## 2017-12-19 NOTE — Progress Notes (Signed)
SUBJECTIVE: The patient presents for follow-up of atrial flutter/fibrillation, hypertension, and a cardiomyopathy which has since normalized.  I reviewed labs: BUN 24 and creatinine 1.31 on 08/22/2017.  CBC was normal.  He underwent right ureteral stent placement on 08/26/2017 for a stricture.  I ordered and personally reviewed the ECG today which showed rate controlled coarse atrial fibrillation.  He denies chest pain and shortness of breath.  He said the other evening he was out to dinner and became dizzy and passed out for a transient period of time.  He denies antecedent palpitations, chest pain, shortness of breath, and headaches.  He denies visual changes.  He was recently started on mirabegron on and this correlates with the onset of dizziness.   Review of Systems: As per "subjective", otherwise negative.  Allergies  Allergen Reactions  . Penicillins Rash    Has patient had a PCN reaction causing immediate rash, facial/tongue/throat swelling, SOB or lightheadedness with hypotension: Yes Has patient had a PCN reaction causing severe rash involving mucus membranes or skin necrosis: No Has patient had a PCN reaction that required hospitalization: No Has patient had a PCN reaction occurring within the last 10 years: No If all of the above answers are "NO", then may proceed with Cephalosporin use.   . Sulfonamide Derivatives Rash    Current Outpatient Medications  Medication Sig Dispense Refill  . alfuzosin (UROXATRAL) 10 MG 24 hr tablet Take 10 mg by mouth at bedtime.     [name] alfuzosin (UROXATRAL) 10 MG 24 hr tablet Take by mouth.    [name] atorvastatin (LIPITOR) 10 MG tablet Take 10 mg by mouth daily.     . Flaxseed, Linseed, (FLAXSEED OIL) 1000 MG CAPS Take 1,000 mg by mouth daily.     . hydrocortisone cream 1 % Apply 1 application topically daily as needed for itching.    . latanoprost (XALATAN) 0.005 % ophthalmic solution Place 1 drop into both eyes once a week.     .  metoprolol succinate (TOPROL-XL) 25 MG 24 hr tablet TAKE ONE TABLET BY MOUTH DAILY 90 tablet 3  . MYRBETRIQ 50 MG TB24 tablet     . Omega-3 Fatty Acids (FISH OIL) 1000 MG CAPS Take 1,000 mg by mouth daily.     . Phenol (CHLORASEPTIC MT) Use as directed 1 spray in the mouth or throat daily as needed (throat pain).    [name] Specialty Vitamins Products (PROSTATE PO) Take 1 tablet by mouth daily.     . traMADol (ULTRAM) 50 MG tablet Take 1 tablet (50 mg total) by mouth every 6 (six) hours as needed. 30 tablet 0  . warfarin (COUMADIN) 5 MG tablet TAKE ONE TABLET BY MOUTH DAILY EXCEPT TAKE 1 AND 1/2 TABLETS ON TUESDAY AND SATURDAY. 45 tablet 4   No current facility-administered medications for this visit.     Past Medical History:  Diagnosis Date  . Atrial flutter, paroxysmal ([place])    a. Hx of TEE with LAA clots. b. s/p ESP/RFCA of atrial flutter 08/2008. b. Recurrence of A-flutter after ablation/atypical, requiring DCCV and amiodaorone.  [name] BPH (benign prostatic hypertrophy)   . Cardiomyopathy ([place])    a. Prior EF 30-35%, felt tachycardia induced cardiomyopathyy. b. Improved after cardioversion and restoration of normal sinus rhythm - EF 65-70% by echo 10/13  . Essential hypertension, benign   . H/O amiodarone therapy    Used for atrial flutter after return of flutter after ablation  . Shortness of breath for last  year   when pt lies down   . Urination, excessive at night   . Warfarin anticoagulation    For atrial flutter    Past Surgical History:  Procedure Laterality Date  . CYSTOSCOPY/RETROGRADE/URETEROSCOPY Right 08/26/2017   Procedure: CYSTOSCOPY/ RIGHT RETROGRADE PYELOGRAM/ RIGHT URETEROSCOPY WITH RIGHT URETERAL STENT PLACEMENT;  Surgeon: Cleon Gustin, MD;  Location: [facility];  Service: Urology;  Laterality: Right;  . FEMORAL HERNIA REPAIR     x2  . TOTAL KNEE ARTHROPLASTY Right 2004  . TRANSURETHRAL PROSTATECTOMY WITH GYRUS INSTRUMENTS N/A 03/12/2013   Procedure: TRANSURETHRAL  PROSTATECTOMY WITH GYRUS INSTRUMENTS;  Surgeon: Ailene Rud, MD;  Location: [facility];  Service: Urology;  Laterality: N/A;  . TURP VAPORIZATION    . UMBILICAL HERNIA REPAIR      Social History   Socioeconomic History  . Marital status: Widowed    Spouse name: Not on file  . Number of children: Not on file  . Years of education: Not on file  . Highest education level: Not on file  Occupational History  . Not on file  Social Needs  . Financial resource strain: Not on file  . Food insecurity:    Worry: Not on file    Inability: Not on file  . Transportation needs:    Medical: Not on file    Non-medical: Not on file  Tobacco Use  . Smoking status: Never Smoker  . Smokeless tobacco: Never Used  . Tobacco comment: tobacco use- no  Substance and Sexual Activity  . Alcohol use: No    Alcohol/week: 0.0 oz  . Drug use: No  . Sexual activity: Not on file  Lifestyle  . Physical activity:    Days per week: Not on file    Minutes per session: Not on file  . Stress: Not on file  Relationships  . Social connections:    Talks on phone: Not on file    Gets together: Not on file    Attends religious service: Not on file    Active member of club or organization: Not on file    Attends meetings of clubs or organizations: Not on file    Relationship status: Not on file  . Intimate partner violence:    Fear of current or ex partner: Not on file    Emotionally abused: Not on file    Physically abused: Not on file    Forced sexual activity: Not on file  Other Topics Concern  . Not on file  Social History Narrative   Retired, married.      Vitals:   12/19/17 0959  BP: 118/68  Pulse: 88  SpO2: 96%  Weight: 214 lb (97.1 kg)  Height: 5\' 10"  (1.778 m)    Wt Readings from Last 3 Encounters:  12/19/17 214 lb (97.1 kg)  08/22/17 216 lb (98 kg)  04/11/17 223 lb (101.2 kg)     PHYSICAL EXAM General: NAD HEENT: Normal. Neck: No JVD, no thyromegaly. Lungs: Clear to  auscultation bilaterally with normal respiratory effort. CV: Regular rate and irregular rhythm, normal S1/S2, no S3, no murmur. No pretibial or periankle edema.  No carotid bruit.   Abdomen: Soft, nontender, no distention.  Neurologic: Alert and oriented.  Psych: Normal affect. Skin: Normal. Musculoskeletal: No gross deformities.    ECG: Reviewed above under Subjective   Labs: Lab Results  Component Value Date/Time   K 3.8 08/22/2017 01:30 PM   BUN 24 (H) 08/22/2017 01:30 PM   CREATININE 1.31 (  H) 08/22/2017 01:30 PM   HGB 13.6 08/22/2017 01:30 PM     Lipids: No results found for: LDLCALC, LDLDIRECT, CHOL, TRIG, HDL     ASSESSMENT AND PLAN: 1.  Permanent atrial fibrillation/flutter: Symptomatically stable.  Heart rate is controlled.  Continue warfarin and Toprol XL 25 mg daily for rate control.  I will check an INR today.  It was 2.1 on 10/24/2017.  2.  Cardiomyopathy: Tachycardia mediated, with improvement and normalization of function.  Most recent LVEF 65 to 70% in October 2013.  3.  Hypertension: Blood pressure is normal.  No changes to therapy.  4.  Dizziness and syncope: I doubt this is cardiac mediated.  He began mirabegron which coincides with the onset of dizziness which likely led to syncope.  I told him to stop taking this medication and to inform urology.  If he has recurrent dizziness and syncope while off of this medication, I would obtain a 30-day event monitor.   Disposition: Follow up 3 months   Kate Sable, M.D., F.A.C.C.

## 2018-01-30 ENCOUNTER — Ambulatory Visit (INDEPENDENT_AMBULATORY_CARE_PROVIDER_SITE_OTHER): Payer: Medicare Other | Admitting: *Deleted

## 2018-01-30 DIAGNOSIS — Z5181 Encounter for therapeutic drug level monitoring: Secondary | ICD-10-CM | POA: Diagnosis not present

## 2018-01-30 DIAGNOSIS — I4892 Unspecified atrial flutter: Secondary | ICD-10-CM

## 2018-01-30 DIAGNOSIS — I482 Chronic atrial fibrillation, unspecified: Secondary | ICD-10-CM

## 2018-01-30 LAB — POCT INR: INR: 2 (ref 2.0–3.0)

## 2018-01-30 NOTE — Patient Instructions (Signed)
Take coumadin extra 1/2 tablet today then resume 1 tablet daily except 1 1/2 tablets on Tuesdays and Saturdays Recheck in 6 weeks

## 2018-03-20 ENCOUNTER — Ambulatory Visit (INDEPENDENT_AMBULATORY_CARE_PROVIDER_SITE_OTHER): Payer: Medicare Other | Admitting: *Deleted

## 2018-03-20 DIAGNOSIS — Z5181 Encounter for therapeutic drug level monitoring: Secondary | ICD-10-CM | POA: Diagnosis not present

## 2018-03-20 DIAGNOSIS — I4892 Unspecified atrial flutter: Secondary | ICD-10-CM | POA: Diagnosis not present

## 2018-03-20 LAB — POCT INR: INR: 1.8 — AB (ref 2.0–3.0)

## 2018-03-20 NOTE — Patient Instructions (Signed)
Take coumadin 2 tablets tonight then resume 1 tablet daily except 1 1/2 tablets on Tuesdays and Saturdays Recheck in 3 weeks

## 2018-03-25 ENCOUNTER — Telehealth: Payer: Self-pay

## 2018-03-25 ENCOUNTER — Other Ambulatory Visit (HOSPITAL_COMMUNITY)
Admission: RE | Admit: 2018-03-25 | Discharge: 2018-03-25 | Disposition: A | Discharge status: Home / Self Care | Payer: Medicare Other | Source: Ambulatory Visit | Attending: Cardiovascular Disease | Admitting: Cardiovascular Disease

## 2018-03-25 ENCOUNTER — Ambulatory Visit: Payer: Medicare Other | Admitting: Cardiovascular Disease

## 2018-03-25 ENCOUNTER — Encounter: Payer: Self-pay | Admitting: Cardiovascular Disease

## 2018-03-25 VITALS — BP 118/66 | HR 62 | Ht 71.0 in | Wt 221.0 lb

## 2018-03-25 DIAGNOSIS — R531 Weakness: Secondary | ICD-10-CM | POA: Insufficient documentation

## 2018-03-25 DIAGNOSIS — I10 Essential (primary) hypertension: Secondary | ICD-10-CM | POA: Diagnosis not present

## 2018-03-25 DIAGNOSIS — R5383 Other fatigue: Secondary | ICD-10-CM

## 2018-03-25 DIAGNOSIS — I482 Chronic atrial fibrillation, unspecified: Secondary | ICD-10-CM | POA: Diagnosis not present

## 2018-03-25 DIAGNOSIS — Z5181 Encounter for therapeutic drug level monitoring: Secondary | ICD-10-CM

## 2018-03-25 DIAGNOSIS — R42 Dizziness and giddiness: Secondary | ICD-10-CM

## 2018-03-25 LAB — COMPREHENSIVE METABOLIC PANEL
ALBUMIN: 3.7 g/dL (ref 3.5–5.0)
ALK PHOS: 56 U/L (ref 38–126)
ALT: 15 U/L (ref 0–44)
ANION GAP: 7 (ref 5–15)
AST: 20 U/L (ref 15–41)
BUN: 25 mg/dL — ABNORMAL HIGH (ref 8–23)
CALCIUM: 9 mg/dL (ref 8.9–10.3)
CHLORIDE: 106 mmol/L (ref 98–111)
CO2: 27 mmol/L (ref 22–32)
CREATININE: 1.2 mg/dL (ref 0.61–1.24)
GFR calc Af Amer: >60 mL/min (ref >60)
GFR calc non Af Amer: 55 mL/min — ABNORMAL LOW (ref >60)
GLUCOSE: 153 mg/dL — AB (ref 70–99)
Potassium: 4.2 mmol/L (ref 3.5–5.1)
Sodium: 140 mmol/L (ref 135–145)
Total Bilirubin: 1 mg/dL (ref 0.3–1.2)
Total Protein: 7.1 g/dL (ref 6.5–8.1)

## 2018-03-25 LAB — CBC WITH DIFFERENTIAL/PLATELET
Basophils Absolute: 0 10*3/uL (ref 0.0–0.1)
Basophils Relative: 0 %
Eosinophils Absolute: 0.2 10*3/uL (ref 0.0–0.7)
Eosinophils Relative: 2 %
HEMATOCRIT: 45.9 % (ref 39.0–52.0)
HEMOGLOBIN: 15.2 g/dL (ref 13.0–17.0)
LYMPHS ABS: 1.6 10*3/uL (ref 0.7–4.0)
Lymphocytes Relative: 17 %
MCH: 31.5 pg (ref 26.0–34.0)
MCHC: 33.1 g/dL (ref 30.0–36.0)
MCV: 95 fL (ref 78.0–100.0)
MONO ABS: 0.7 10*3/uL (ref 0.1–1.0)
MONOS PCT: 8 %
NEUTROS ABS: 6.8 10*3/uL (ref 1.7–7.7)
Neutrophils Relative %: 73 %
Platelets: 153 10*3/uL (ref 150–400)
RBC: 4.83 MIL/uL (ref 4.22–5.81)
RDW: 14 % (ref 11.5–15.5)
WBC: 9.4 10*3/uL (ref 4.0–10.5)

## 2018-03-25 LAB — TSH: TSH: 2.046 u[IU]/mL (ref 0.350–4.500)

## 2018-03-25 LAB — VITAMIN B12: VITAMIN B 12: 745 pg/mL (ref 180–914)

## 2018-03-25 NOTE — Telephone Encounter (Signed)
Called pt. No answer. Left message for pt to return call.  

## 2018-03-25 NOTE — Patient Instructions (Signed)
Medication Instructions:  Your physician recommends that you continue on your current medications as directed. Please refer to the Current Medication list given to you today. Start Taking Multivitamin   Labwork: Your physician recommends that you return for lab work in: Today    Testing/Procedures: NONE   Follow-Up: Your physician wants you to follow-up in: 1 Year. You will receive a reminder letter in the mail two months in advance. If you don't receive a letter, please call our office to schedule the follow-up appointment.   Any Other Special Instructions Will Be Listed Below (If Applicable).     If you need a refill on your cardiac medications before your next appointment, please call your pharmacy.  Thank you for choosing [facility]!

## 2018-03-25 NOTE — Telephone Encounter (Signed)
-----   Message from Herminio Commons, MD sent at 03/25/2018 11:16 AM EDT ----- No evidence of infection or anemia.  I will await additional blood test results.

## 2018-03-25 NOTE — Progress Notes (Signed)
SUBJECTIVE: The patient presents for follow-up of permanent atrial fibrillation and flutter with a history of tachycardia mediated cardiomyopathy.  He walks 1 mile daily with his wife.  He denies exertional chest pain and shortness of breath.  He seldom has dizziness.  He denies syncope.  He complains of bilateral hand numbness.  He said he feels weak which is unusual for him.  He denies orthopnea, leg swelling, and paroxysmal nocturnal dyspnea.    Review of Systems: As per "subjective", otherwise negative.  Allergies  Allergen Reactions  . Penicillins Rash    Has patient had a PCN reaction causing immediate rash, facial/tongue/throat swelling, SOB or lightheadedness with hypotension: Yes Has patient had a PCN reaction causing severe rash involving mucus membranes or skin necrosis: No Has patient had a PCN reaction that required hospitalization: No Has patient had a PCN reaction occurring within the last 10 years: No If all of the above answers are "NO", then may proceed with Cephalosporin use.   . Sulfonamide Derivatives Rash    Current Outpatient Medications  Medication Sig Dispense Refill  . alfuzosin (UROXATRAL) 10 MG 24 hr tablet Take 10 mg by mouth at bedtime.     [name] atorvastatin (LIPITOR) 10 MG tablet Take 10 mg by mouth daily.     . hydrocortisone cream 1 % Apply 1 application topically daily as needed for itching.    . latanoprost (XALATAN) 0.005 % ophthalmic solution Place 1 drop into both eyes once a week.     . metoprolol succinate (TOPROL-XL) 25 MG 24 hr tablet TAKE ONE TABLET BY MOUTH DAILY 90 tablet 3  . Multiple Vitamins-Minerals (ICAPS AREDS 2) CAPS Take by mouth.    . Omega-3 Fatty Acids (FISH OIL) 1000 MG CAPS Take 1,000 mg by mouth daily.     [name] warfarin (COUMADIN) 5 MG tablet TAKE ONE TABLET BY MOUTH DAILY EXCEPT TAKE 1 AND 1/2 TABLETS ON TUESDAY AND SATURDAY. 45 tablet 4   No current facility-administered medications for this visit.     Past Medical  History:  Diagnosis Date  . Atrial flutter, paroxysmal ([place])    a. Hx of TEE with LAA clots. b. s/p ESP/RFCA of atrial flutter 08/2008. b. Recurrence of A-flutter after ablation/atypical, requiring DCCV and amiodaorone.  [name] BPH (benign prostatic hypertrophy)   . Cardiomyopathy ([place])    a. Prior EF 30-35%, felt tachycardia induced cardiomyopathyy. b. Improved after cardioversion and restoration of normal sinus rhythm - EF 65-70% by echo 10/13  . Essential hypertension, benign   . H/O amiodarone therapy    Used for atrial flutter after return of flutter after ablation  . Shortness of breath for last year   when pt lies down   . Urination, excessive at night   . Warfarin anticoagulation    For atrial flutter    Past Surgical History:  Procedure Laterality Date  . CYSTOSCOPY/RETROGRADE/URETEROSCOPY Right 08/26/2017   Procedure: CYSTOSCOPY/ RIGHT RETROGRADE PYELOGRAM/ RIGHT URETEROSCOPY WITH RIGHT URETERAL STENT PLACEMENT;  Surgeon: Cleon Gustin, MD;  Location: [facility];  Service: Urology;  Laterality: Right;  . FEMORAL HERNIA REPAIR     x2  . TOTAL KNEE ARTHROPLASTY Right 2004  . TRANSURETHRAL PROSTATECTOMY WITH GYRUS INSTRUMENTS N/A 03/12/2013   Procedure: TRANSURETHRAL PROSTATECTOMY WITH GYRUS INSTRUMENTS;  Surgeon: Ailene Rud, MD;  Location: [facility];  Service: Urology;  Laterality: N/A;  . TURP VAPORIZATION    . UMBILICAL HERNIA REPAIR      Social History  Socioeconomic History  . Marital status: Widowed    Spouse name: Not on file  . Number of children: Not on file  . Years of education: Not on file  . Highest education level: Not on file  Occupational History  . Not on file  Social Needs  . Financial resource strain: Not on file  . Food insecurity:    Worry: Not on file    Inability: Not on file  . Transportation needs:    Medical: Not on file    Non-medical: Not on file  Tobacco Use  . Smoking status: Never Smoker  . Smokeless tobacco: Never Used  .  Tobacco comment: tobacco use- no  Substance and Sexual Activity  . Alcohol use: No    Alcohol/week: 0.0 standard drinks  . Drug use: No  . Sexual activity: Not on file  Lifestyle  . Physical activity:    Days per week: Not on file    Minutes per session: Not on file  . Stress: Not on file  Relationships  . Social connections:    Talks on phone: Not on file    Gets together: Not on file    Attends religious service: Not on file    Active member of club or organization: Not on file    Attends meetings of clubs or organizations: Not on file    Relationship status: Not on file  . Intimate partner violence:    Fear of current or ex partner: Not on file    Emotionally abused: Not on file    Physically abused: Not on file    Forced sexual activity: Not on file  Other Topics Concern  . Not on file  Social History Narrative   Retired, married.      Vitals:   03/25/18 1014  BP: 118/66  Pulse: 62  SpO2: 96%  Weight: 221 lb (100.2 kg)  Height: 5\' 11"  (1.803 m)    Wt Readings from Last 3 Encounters:  03/25/18 221 lb (100.2 kg)  12/19/17 214 lb (97.1 kg)  08/22/17 216 lb (98 kg)     PHYSICAL EXAM General: NAD HEENT: Normal. Neck: No JVD, no thyromegaly. Lungs: Clear to auscultation bilaterally with normal respiratory effort. CV: Regular rate and irregular rhythm, normal S1/S2, no S3, no murmur. No pretibial or periankle edema.    Abdomen: Soft, nontender, no distention.  Neurologic: Alert and oriented.  Psych: Normal affect. Skin: Normal. Musculoskeletal: No gross deformities.    ECG: Reviewed above under Subjective   Labs: Lab Results  Component Value Date/Time   K 3.8 08/22/2017 01:30 PM   BUN 24 (H) 08/22/2017 01:30 PM   CREATININE 1.31 (H) 08/22/2017 01:30 PM   HGB 13.6 08/22/2017 01:30 PM     Lipids: No results found for: LDLCALC, LDLDIRECT, CHOL, TRIG, HDL     ASSESSMENT AND PLAN:  1.  Permanent atrial fibrillation/flutter: Symptomatically  stable.  Heart rate is controlled.  Continue warfarin and Toprol XL 25 mg daily for rate control.  INR slightly subtherapeutic on 03/20/2018, 1.8.  2.  Cardiomyopathy: Tachycardia mediated, with improvement and normalization of function.  Most recent LVEF 65 to 70% in October 2013.  No symptoms suggestive of heart failure.  3.  Hypertension: Blood pressure is normal.  No changes to therapy.  4.  Dizziness and syncope: He seldom has dizziness and has had no further episodes of syncope.  5.  Generalized weakness and fatigue: I encouraged him to start taking a multivitamin daily.  I  will check a conference of metabolic panel, CBC, vitamin B12 level, vitamin D level, and TSH.   Disposition: Follow up 1 year with me.  I encouraged him to make an appointment with his PCP in the near future.   Kate Sable, M.D., F.A.C.C.

## 2018-03-26 LAB — VITAMIN D 25 HYDROXY (VIT D DEFICIENCY, FRACTURES): Vit D, 25-Hydroxy: 28.3 ng/mL — ABNORMAL LOW (ref 30.0–100.0)

## 2018-05-19 ENCOUNTER — Other Ambulatory Visit: Payer: Self-pay | Admitting: Cardiovascular Disease

## 2018-06-30 ENCOUNTER — Other Ambulatory Visit: Payer: Self-pay | Admitting: Cardiovascular Disease

## 2018-07-08 ENCOUNTER — Ambulatory Visit (INDEPENDENT_AMBULATORY_CARE_PROVIDER_SITE_OTHER): Payer: Medicare Other | Admitting: Pharmacist

## 2018-07-08 DIAGNOSIS — I4892 Unspecified atrial flutter: Secondary | ICD-10-CM | POA: Diagnosis not present

## 2018-07-08 DIAGNOSIS — Z5181 Encounter for therapeutic drug level monitoring: Secondary | ICD-10-CM

## 2018-07-08 LAB — POCT INR: INR: 3 (ref 2.0–3.0)

## 2018-07-08 NOTE — Patient Instructions (Signed)
Description   Continue 1 tablet daily except 1 1/2 tablets on Tuesdays and Saturdays Recheck in 4 weeks

## 2018-08-05 ENCOUNTER — Ambulatory Visit (INDEPENDENT_AMBULATORY_CARE_PROVIDER_SITE_OTHER): Payer: Medicare Other | Admitting: Pharmacist

## 2018-08-05 DIAGNOSIS — Z5181 Encounter for therapeutic drug level monitoring: Secondary | ICD-10-CM

## 2018-08-05 DIAGNOSIS — I4892 Unspecified atrial flutter: Secondary | ICD-10-CM | POA: Diagnosis not present

## 2018-08-05 LAB — POCT INR: INR: 2.8 (ref 2.0–3.0)

## 2018-08-05 NOTE — Patient Instructions (Signed)
Description   Continue 1 tablet daily except 1 1/2 tablets on Tuesdays and Saturdays Recheck in 4 weeks

## 2018-09-02 ENCOUNTER — Ambulatory Visit (INDEPENDENT_AMBULATORY_CARE_PROVIDER_SITE_OTHER): Payer: Medicare Other | Admitting: *Deleted

## 2018-09-02 DIAGNOSIS — I4892 Unspecified atrial flutter: Secondary | ICD-10-CM | POA: Diagnosis not present

## 2018-09-02 DIAGNOSIS — Z5181 Encounter for therapeutic drug level monitoring: Secondary | ICD-10-CM | POA: Diagnosis not present

## 2018-09-02 LAB — POCT INR: INR: 3.2 — AB (ref 2.0–3.0)

## 2018-09-02 NOTE — Patient Instructions (Signed)
Take coumadin 1/2 tablet tonight then resume 1 tablet daily except 1 1/2 tablets on Tuesdays and Saturdays Recheck in 4 weeks

## 2018-09-29 ENCOUNTER — Telehealth: Payer: Self-pay | Admitting: Cardiovascular Disease

## 2018-09-29 NOTE — Telephone Encounter (Signed)
° ° ° °  COVID-19 Pre-Screening Questions: ° °• Do you currently have a fever? No °•  °• Have you recently travelled on a cruise, internationally, or to [place], [place], [place], [place], [place], or [place], [place] ([employer]) ? No °•  °• Have you been in contact with someone that is currently pending confirmation of Covid19 testing or has been confirmed to have the Covid19 virus? No °•  °• Are you currently experiencing fatigue or cough? No  ° ° °   ° ° ° ° ° °

## 2018-09-30 ENCOUNTER — Other Ambulatory Visit: Payer: Self-pay

## 2018-09-30 ENCOUNTER — Ambulatory Visit (INDEPENDENT_AMBULATORY_CARE_PROVIDER_SITE_OTHER): Payer: Medicare Other | Admitting: *Deleted

## 2018-09-30 DIAGNOSIS — I4892 Unspecified atrial flutter: Secondary | ICD-10-CM

## 2018-09-30 DIAGNOSIS — Z5181 Encounter for therapeutic drug level monitoring: Secondary | ICD-10-CM

## 2018-09-30 LAB — POCT INR: INR: 2.2 (ref 2.0–3.0)

## 2018-09-30 NOTE — Patient Instructions (Signed)
Continue coumadin 1 tablet daily except 1 1/2 tablets on Tuesdays and Saturdays Recheck in 4 weeks 

## 2018-10-27 ENCOUNTER — Telehealth: Payer: Self-pay | Admitting: *Deleted

## 2018-10-27 NOTE — Telephone Encounter (Signed)

## 2018-10-28 ENCOUNTER — Ambulatory Visit (INDEPENDENT_AMBULATORY_CARE_PROVIDER_SITE_OTHER): Payer: Medicare Other | Admitting: *Deleted

## 2018-10-28 ENCOUNTER — Other Ambulatory Visit: Payer: Self-pay

## 2018-10-28 DIAGNOSIS — Z5181 Encounter for therapeutic drug level monitoring: Secondary | ICD-10-CM | POA: Diagnosis not present

## 2018-10-28 DIAGNOSIS — I4892 Unspecified atrial flutter: Secondary | ICD-10-CM

## 2018-10-28 LAB — POCT INR: INR: 2.1 (ref 2.0–3.0)

## 2018-10-28 NOTE — Patient Instructions (Signed)
Continue coumadin 1 tablet daily except 1 1/2 tablets on Tuesdays and Saturdays Recheck in 6 weeks 

## 2018-11-24 ENCOUNTER — Telehealth: Payer: Self-pay | Admitting: Student

## 2018-11-24 NOTE — Telephone Encounter (Signed)
Virtual Visit Pre-Appointment Phone Call  "(Name), I am calling you today to discuss your upcoming appointment. We are currently trying to limit exposure to the virus that causes COVID-19 by seeing patients at home rather than in the office."  1. "What is the BEST phone number to call the day of the visit?" -[phone]   2. Do you have or have access to (through a family member/friend) a smartphone with video capability that we can use for your visit?" a. If yes - list this number in appt notes as cell (if different from BEST phone #) and list the appointment type as a VIDEO visit in appointment notes b. If no - list the appointment type as a PHONE visit in appointment notes  3. Confirm consent - "In the setting of the current Covid19 crisis, you are scheduled for a (phone or video) visit with your provider on (date) at (time).  Just as we do with many in-office visits, in order for you to participate in this visit, we must obtain consent.  If you'd like, I can send this to your mychart (if signed up) or email for you to review.  Otherwise, I can obtain your verbal consent now.  All virtual visits are billed to your insurance company just like a normal visit would be.  By agreeing to a virtual visit, we'd like you to understand that the technology does not allow for your provider to perform an examination, and thus may limit your provider's ability to fully assess your condition. If your provider identifies any concerns that need to be evaluated in person, we will make arrangements to do so.  Finally, though the technology is pretty good, we cannot assure that it will always work on either your or our end, and in the setting of a video visit, we may have to convert it to a phone-only visit.  In either situation, we cannot ensure that we have a secure connection.  Are you willing to proceed?" STAFF: Did the patient verbally acknowledge consent to telehealth visit? Document YES/NO here:  YES  4. Advise patient to be prepared - "Two hours prior to your appointment, go ahead and check your blood pressure, pulse, oxygen saturation, and your weight (if you have the equipment to check those) and write them all down. When your visit starts, your provider will ask you for this information. If you have an Apple Watch or Kardia device, please plan to have heart rate information ready on the day of your appointment. Please have a pen and paper handy nearby the day of the visit as well."  5. Give patient instructions for MyChart download to smartphone OR Doximity/Doxy.me as below if video visit (depending on what platform provider is using)  6. Inform patient they will receive a phone call 15 minutes prior to their appointment time (may be from unknown caller ID) so they should be prepared to answer    TELEPHONE CALL NOTE  [name] has been deemed a candidate for a follow-up tele-health visit to limit community exposure during the Covid-19 pandemic. I spoke with the patient via phone to ensure availability of phone/video source, confirm preferred email & phone number, and discuss instructions and expectations.  I reminded [name] to be prepared with any vital sign and/or heart rhythm information that could potentially be obtained via home monitoring, at the time of his visit. I reminded [name] to expect a phone call prior to his visit.  [name] 11/24/2018 9:28 AM   INSTRUCTIONS FOR DOWNLOADING THE MYCHART APP TO SMARTPHONE  - The patient must first make sure to have activated MyChart and know their login information - If Apple, go to [employer] and type in MyChart in the search bar and download the app. If Android, ask patient to go to [employer] and type in [place] in the search bar and download the app. The app is free but as with any other app downloads, their phone may require them to verify saved payment information or Apple/Android password.  -  The patient will need to then log into the app with their MyChart username and password, and select [facility] as their healthcare provider to link the account. When it is time for your visit, go to the MyChart app, find appointments, and click Begin Video Visit. Be sure to Select Allow for your device to access the Microphone and Camera for your visit. You will then be connected, and your provider will be with you shortly.  **If they have any issues connecting, or need assistance please contact MyChart service desk [phone])**  **If using a computer, in order to ensure the best quality for their visit they will need to use either of the following Internet Browsers: [employer], or Google Chrome**  IF USING DOXIMITY or DOXY.ME - The patient will receive a link just prior to their visit by text.     FULL LENGTH CONSENT FOR TELE-HEALTH VISIT   I hereby voluntarily request, consent and authorize [facility] and its employed or contracted physicians, physician assistants, nurse practitioners or other licensed health care professionals (the Practitioner), to provide me with telemedicine health care services (the Services") as deemed necessary by the treating Practitioner. I acknowledge and consent to receive the Services by the Practitioner via telemedicine. I understand that the telemedicine visit will involve communicating with the Practitioner through live audiovisual communication technology and the disclosure of certain medical information by electronic transmission. I acknowledge that I have been given the opportunity to request an in-person assessment or other available alternative prior to the telemedicine visit and am voluntarily participating in the telemedicine visit.  I understand that I have the right to withhold or withdraw my consent to the use of telemedicine in the course of my care at any time, without affecting my right to future care or treatment, and that the  Practitioner or I may terminate the telemedicine visit at any time. I understand that I have the right to inspect all information obtained and/or recorded in the course of the telemedicine visit and may receive copies of available information for a reasonable fee.  I understand that some of the potential risks of receiving the Services via telemedicine include:   Delay or interruption in medical evaluation due to technological equipment failure or disruption;  Information transmitted may not be sufficient (e.g. poor resolution of images) to allow for appropriate medical decision making by the Practitioner; and/or   In rare instances, security protocols could fail, causing a breach of personal health information.  Furthermore, I acknowledge that it is my responsibility to provide information about my medical history, conditions and care that is complete and accurate to the best of my ability. I acknowledge that Practitioner's advice, recommendations, and/or decision may be based on factors not within their control, such as incomplete or inaccurate data provided by me or distortions of diagnostic images or specimens that may result from electronic transmissions. I understand that the practice  of medicine is not an Chief Strategy Officer and that Practitioner makes no warranties or guarantees regarding treatment outcomes. I acknowledge that I will receive a copy of this consent concurrently upon execution via email to the email address I last provided but may also request a printed copy by calling the office of [facility].    I understand that my insurance will be billed for this visit.   I have read or had this consent read to me.  I understand the contents of this consent, which adequately explains the benefits and risks of the Services being provided via telemedicine.   I have been provided ample opportunity to ask questions regarding this consent and the Services and have had my questions answered to my  satisfaction.  I give my informed consent for the services to be provided through the use of telemedicine in my medical care  By participating in this telemedicine visit I agree to the above.

## 2018-11-28 ENCOUNTER — Telehealth (INDEPENDENT_AMBULATORY_CARE_PROVIDER_SITE_OTHER): Payer: Medicare Other | Admitting: Student

## 2018-11-28 ENCOUNTER — Encounter: Payer: Self-pay | Admitting: Student

## 2018-11-28 VITALS — Ht 71.0 in | Wt 215.0 lb

## 2018-11-28 DIAGNOSIS — I4821 Permanent atrial fibrillation: Secondary | ICD-10-CM

## 2018-11-28 DIAGNOSIS — R5383 Other fatigue: Secondary | ICD-10-CM

## 2018-11-28 DIAGNOSIS — Z8679 Personal history of other diseases of the circulatory system: Secondary | ICD-10-CM

## 2018-11-28 DIAGNOSIS — R42 Dizziness and giddiness: Secondary | ICD-10-CM

## 2018-11-28 DIAGNOSIS — Z7189 Other specified counseling: Secondary | ICD-10-CM

## 2018-11-28 NOTE — Progress Notes (Signed)
Virtual Visit via Telephone Note   This visit type was conducted due to national recommendations for restrictions regarding the COVID-19 Pandemic (e.g. social distancing) in an effort to limit this patient's exposure and mitigate transmission in our community.  Due to his co-morbid illnesses, this patient is at least at moderate risk for complications without adequate follow up.  This format is felt to be most appropriate for this patient at this time.  The patient did not have access to video technology/had technical difficulties with video requiring transitioning to audio format only (telephone).  All issues noted in this document were discussed and addressed.  No physical exam could be performed with this format.  Please refer to the patient's chart for his  consent to telehealth for [facility].   Date:  11/28/2018   ID:  [name], DOB [birth year], MRN [id number]  Patient Location: Home Provider Location: Office  PCP:  Glenda Chroman, MD  Cardiologist:  Kate Sable, MD  Electrophysiologist:  None   Evaluation Performed:  Follow-Up Visit  Chief Complaint: Weakness and Fatigue  History of Present Illness:    [name] is a 86 y.o. male with with past medical history of permanent atrial fibrillation/flutter, history of tachycardia-mediated cardiomyopathy (EF previously 30 to 35%, improved to 65 to 70% by repeat echocardiogram in 2013), HTN, and prior syncope who presents for a telehealth visit for evaluation of worsening dizziness.  His last visit with Dr. Bronson Ing was in 03/2018 and he reported walking a mile each day without any chest pain or dyspnea on exertion. He did report having intermittent episodes of dizziness along with bilateral hand numbness. He was continued on his current medication regimen at that time including Toprol-XL 25 mg daily and Coumadin for anticoagulation. Repeat labs were obtained for further evaluation of his weakness. TSH and B12 were WNL.  Vitamin D was low and it was recommended he start OTC supplementation. Electrolytes were WNL and Hgb was stable at 15.2.  In talking with the patient today, he reports having more frequent episodes of dizziness and fatigue since his last office visit. This typically occurs when he is up walking and resolves when sitting down to rest. He denies any associated chest pain or palpitations when the episodes occur.  No recent dyspnea on exertion, orthopnea, PND, or lower extremity edema.  He has a blood pressure cuff but has not checked his blood pressure or pulse when this occurs. He had lab work by Dr. Bronson Ing in 03/2018 and reports having blood work by his PCP in the interim which was unrevealing for an etiology of his symptoms.  The patient does not have symptoms concerning for COVID-19 infection (fever, chills, cough, or new shortness of breath).    Past Medical History:  Diagnosis Date  . Atrial flutter, paroxysmal ([place])    a. Hx of TEE with LAA clots. b. s/p ESP/RFCA of atrial flutter 08/2008. b. Recurrence of A-flutter after ablation/atypical, requiring DCCV and amiodaorone.  [name] BPH (benign prostatic hypertrophy)   . Cardiomyopathy ([place])    a. Prior EF 30-35%, felt tachycardia induced cardiomyopathyy. b. Improved after cardioversion and restoration of normal sinus rhythm - EF 65-70% by echo 10/13  . Essential hypertension, benign   . H/O amiodarone therapy    Used for atrial flutter after return of flutter after ablation  . Shortness of breath for last year   when pt lies down   . Urination, excessive at night   . Warfarin anticoagulation  For atrial flutter   Past Surgical History:  Procedure Laterality Date  . CYSTOSCOPY/RETROGRADE/URETEROSCOPY Right 08/26/2017   Procedure: CYSTOSCOPY/ RIGHT RETROGRADE PYELOGRAM/ RIGHT URETEROSCOPY WITH RIGHT URETERAL STENT PLACEMENT;  Surgeon: Cleon Gustin, MD;  Location: [facility];  Service: Urology;  Laterality: Right;  . FEMORAL HERNIA  REPAIR     x2  . TOTAL KNEE ARTHROPLASTY Right 2004  . TRANSURETHRAL PROSTATECTOMY WITH GYRUS INSTRUMENTS N/A 03/12/2013   Procedure: TRANSURETHRAL PROSTATECTOMY WITH GYRUS INSTRUMENTS;  Surgeon: Ailene Rud, MD;  Location: [facility];  Service: Urology;  Laterality: N/A;  . TURP VAPORIZATION    . UMBILICAL HERNIA REPAIR       Current Meds  Medication Sig  . alfuzosin (UROXATRAL) 10 MG 24 hr tablet Take 10 mg by mouth at bedtime.   [name] atorvastatin (LIPITOR) 10 MG tablet Take 10 mg by mouth daily.   . hydrocortisone cream 1 % Apply 1 application topically daily as needed for itching.  . latanoprost (XALATAN) 0.005 % ophthalmic solution Place 1 drop into both eyes once a week.   . metoprolol succinate (TOPROL-XL) 25 MG 24 hr tablet TAKE ONE TABLET BY MOUTH DAILY  . Multiple Vitamins-Minerals (ICAPS AREDS 2) CAPS Take by mouth.  . Omega-3 Fatty Acids (FISH OIL) 1000 MG CAPS Take 1,000 mg by mouth daily.   [name] warfarin (COUMADIN) 5 MG tablet TAKE ONE TABLET BY MOUTH DAILY EXCEPT TAKE 1 AND 1/2 TABLETS ON TUESDAY AND SATURDAY.     Allergies:   Penicillins and Sulfonamide derivatives   Social History   Tobacco Use  . Smoking status: Never Smoker  . Smokeless tobacco: Never Used  . Tobacco comment: tobacco use- no  Substance Use Topics  . Alcohol use: No    Alcohol/week: 0.0 standard drinks  . Drug use: No     Family Hx: The patient's family history includes Dementia in his father and mother; Diabetes in his mother and paternal grandmother.  ROS:   Please see the history of present illness.     All other systems reviewed and are negative.   Prior CV studies:   The following studies were reviewed today:  Echocardiogram: 2013: EF 65-70% by review of notes but unable to load actual report.   Labs/Other Tests and Data Reviewed:    EKG:  An ECG dated 12/19/2017 was personally reviewed today and demonstrated:  Rate-controlled atrial fibrillation, HR 86.  Recent Labs:  03/25/2018: ALT 15; BUN 25; Creatinine, Ser 1.20; Hemoglobin 15.2; Platelets 153; Potassium 4.2; Sodium 140; TSH 2.046   Recent Lipid Panel No results found for: CHOL, TRIG, HDL, CHOLHDL, LDLCALC, LDLDIRECT  Wt Readings from Last 3 Encounters:  11/28/18 215 lb (97.5 kg)  03/25/18 221 lb (100.2 kg)  12/19/17 214 lb (97.1 kg)     Objective:    Vital Signs:  Ht 5\' 11"  (1.803 m)   Wt 215 lb (97.5 kg)   BMI 29.99 kg/m    General: Pleasant male sounding in NAD Psych: Normal affect. Neuro: Alert and oriented X 3. Lungs:  Resp regular and unlabored while talking on the phone.    ASSESSMENT & PLAN:    1. Permanent Atrial Fibrillation - he denies any recent palpitations. He has a BP cuff but does not check his HR or BP regularly. I encouraged him to check this a few times per week and report back. Continue Toprol-XL 25mg  daily.  - he denies any evidence of active bleeding. Remains on Coumadin for anticoagulation. INR 2.1 on most  recent check last month.   2. Dizziness/Fatigue - this has been an issue for the patient for the past 6+ months but has continued to progress. Recent lab work has been unrevealing per his report.  - given his known atrial fibrillation and history of cardiomyopathy, will obtain an echocardiogram for initial evaluation. I encouraged him to keep a record of his HR and BP. If echo unrevealing, could consider a 2-week monitor to assess for any significant pauses or episodes of atrial fibrillation with RVR.   3. History of Cardiomyopathy - EF previously 30 to 35%, improved to 65 to 70% by repeat echocardiogram in 2013 with no repeat imaging since. Given his above symptoms, will obtain repeat imaging. He denies any orthopnea, PND, edema, or dyspnea which would be concerning for an acute CHF exacerbation.   4. COVID-19 Education - The signs and symptoms of COVID-19 were discussed with the patient and how to seek care for testing (follow up with PCP or arrange E-visit).   The importance of social distancing was discussed today.  Time:   Today, I have spent 19 minutes with the patient with telehealth technology discussing the above problems.     Medication Adjustments/Labs and Tests Ordered: Current medicines are reviewed at length with the patient today.  Concerns regarding medicines are outlined above.   Tests Ordered: Orders Placed This Encounter  Procedures  . ECHOCARDIOGRAM COMPLETE    Medication Changes: No orders of the defined types were placed in this encounter.   Disposition:  Follow up with Dr. Bronson Ing in 3 months.   Signed, [name], PA-C  11/28/2018 4:18 PM    [facility]

## 2018-11-28 NOTE — Patient Instructions (Signed)
Medication Instructions: Your physician recommends that you continue on your current medications as directed. Please refer to the Current Medication list given to you today.   Labwork: none  Procedures/Testing: Your physician has requested that you have an echocardiogram at [facility]. Echocardiography is a painless test that uses sound waves to create images of your heart. It provides your doctor with information about the size and shape of your heart and how well your heart's chambers and valves are working. This procedure takes approximately one hour. There are no restrictions for this procedure.    Follow-Up: 3 months with Dr.Koneswaran in the [facility] office  Any Additional Special Instructions Will Be Listed Below (If Applicable).   Keep BP and heart rate recordings a few times a week, call back with results  If you need a refill on your cardiac medications before your next appointment, please call your pharmacy.     Thank you for choosing [facility] !

## 2018-12-03 ENCOUNTER — Ambulatory Visit (HOSPITAL_COMMUNITY)
Admission: RE | Admit: 2018-12-03 | Discharge: 2018-12-03 | Disposition: A | Discharge status: Home / Self Care | Payer: Medicare Other | Source: Ambulatory Visit | Attending: Student | Admitting: Student

## 2018-12-03 ENCOUNTER — Other Ambulatory Visit: Payer: Self-pay

## 2018-12-03 DIAGNOSIS — R42 Dizziness and giddiness: Secondary | ICD-10-CM

## 2018-12-03 DIAGNOSIS — R5383 Other fatigue: Secondary | ICD-10-CM | POA: Insufficient documentation

## 2018-12-03 DIAGNOSIS — I4821 Permanent atrial fibrillation: Secondary | ICD-10-CM | POA: Diagnosis present

## 2018-12-03 NOTE — Progress Notes (Signed)
*  PRELIMINARY RESULTS* Echocardiogram 2D Echocardiogram has been performed.  [name] 12/03/2018, 11:36 AM

## 2018-12-04 ENCOUNTER — Telehealth: Payer: Self-pay | Admitting: *Deleted

## 2018-12-04 DIAGNOSIS — R42 Dizziness and giddiness: Secondary | ICD-10-CM

## 2018-12-04 DIAGNOSIS — I482 Chronic atrial fibrillation, unspecified: Secondary | ICD-10-CM

## 2018-12-04 NOTE — Telephone Encounter (Signed)
Order placed pt notified

## 2018-12-04 NOTE — Telephone Encounter (Signed)
-----   Message from [name], [place] sent at 12/04/2018 11:54 AM EDT ----- Please let the patient know that his echocardiogram showed a preserved EF of 60 to 65% with no regional wall motion abnormalities.  No significant valve abnormalities.  Nothing to account for his recent episodes of dizziness and fatigue. If patient in agreement, would recommend a 2-week cardiac event monitor as discussed at his office visit to assess for any significant pauses or episodes of atrial fibrillation with RVR. Associations would be dizziness and atrial fibrillation. Please forward a copy of his echo results to Glenda Chroman, MD.

## 2018-12-09 ENCOUNTER — Ambulatory Visit (INDEPENDENT_AMBULATORY_CARE_PROVIDER_SITE_OTHER): Payer: Medicare Other | Admitting: *Deleted

## 2018-12-09 ENCOUNTER — Other Ambulatory Visit: Payer: Self-pay | Admitting: Cardiovascular Disease

## 2018-12-09 DIAGNOSIS — I482 Chronic atrial fibrillation, unspecified: Secondary | ICD-10-CM | POA: Diagnosis not present

## 2018-12-09 DIAGNOSIS — Z5181 Encounter for therapeutic drug level monitoring: Secondary | ICD-10-CM | POA: Diagnosis not present

## 2018-12-09 LAB — POCT INR: INR: 2.4 (ref 2.0–3.0)

## 2018-12-09 NOTE — Patient Instructions (Signed)
Continue coumadin 1 tablet daily except 1 1/2 tablets on Tuesdays and Saturdays Recheck in 6 weeks 

## 2018-12-15 ENCOUNTER — Other Ambulatory Visit: Payer: Self-pay

## 2018-12-15 ENCOUNTER — Ambulatory Visit (INDEPENDENT_AMBULATORY_CARE_PROVIDER_SITE_OTHER): Payer: Medicare Other

## 2018-12-15 DIAGNOSIS — I482 Chronic atrial fibrillation, unspecified: Secondary | ICD-10-CM | POA: Diagnosis not present

## 2018-12-15 DIAGNOSIS — R42 Dizziness and giddiness: Secondary | ICD-10-CM

## 2018-12-29 ENCOUNTER — Telehealth: Payer: Self-pay | Admitting: *Deleted

## 2018-12-29 MED ORDER — METOPROLOL SUCCINATE ER 25 MG PO TB24: 12.5000 mg | ORAL_TABLET | Freq: Every day | ORAL | 1 refills | Status: DC

## 2018-12-29 NOTE — Telephone Encounter (Signed)
Per Dr Bronson Ing review of BP log (sent for scanning) pt is to decrease Toprol XL 12.5 mg daily - pt made aware and updated medication list

## 2019-01-09 ENCOUNTER — Telehealth: Payer: Self-pay | Admitting: *Deleted

## 2019-01-09 NOTE — Telephone Encounter (Signed)
Called patient with test results. No answer. Left message to call back.  

## 2019-01-09 NOTE — Telephone Encounter (Signed)
-----   Message from [name], [place] sent at 01/08/2019 10:42 AM EDT ----- Please let the patient know his event monitor showed frequent PVC's and occasional episodes of tachycardia.  I reviewed this with Dr. Bronson Ing as he just recently reduced his Toprol-XL dosing due to hypotension and he recommended to continue his current medication regimen for now. Would encourage him to continue to follow his HR and BP. Please forward a copy of results to Glenda Chroman, MD.

## 2019-01-12 NOTE — Telephone Encounter (Signed)
Returned call for results 

## 2019-01-12 NOTE — Telephone Encounter (Signed)
Results given to patient,copied pcp 

## 2019-01-27 ENCOUNTER — Other Ambulatory Visit: Payer: Self-pay

## 2019-01-27 ENCOUNTER — Ambulatory Visit (INDEPENDENT_AMBULATORY_CARE_PROVIDER_SITE_OTHER): Payer: Medicare Other | Admitting: *Deleted

## 2019-01-27 DIAGNOSIS — Z5181 Encounter for therapeutic drug level monitoring: Secondary | ICD-10-CM

## 2019-01-27 DIAGNOSIS — I482 Chronic atrial fibrillation, unspecified: Secondary | ICD-10-CM | POA: Diagnosis not present

## 2019-01-27 LAB — POCT INR: INR: 2 (ref 2.0–3.0)

## 2019-01-27 NOTE — Patient Instructions (Signed)
Continue coumadin 1 tablet daily except 1 1/2 tablets on Tuesdays and Saturdays Recheck in 6 weeks 

## 2019-02-24 ENCOUNTER — Telehealth: Payer: Self-pay | Admitting: Pharmacist

## 2019-02-24 NOTE — Telephone Encounter (Signed)
Called pt to discuss changing to [place] therapy due to better safety and efficacy data, especially in the setting of COVID pandemic to help reduce in-office visits. Pt is interested in taking a DOAC but unfortunately cost is prohibitive at this time. Can revisit again in the future once DOACs go generic.

## 2019-03-04 ENCOUNTER — Encounter: Payer: Self-pay | Admitting: *Deleted

## 2019-03-04 ENCOUNTER — Other Ambulatory Visit: Payer: Self-pay

## 2019-03-04 ENCOUNTER — Telehealth: Payer: Self-pay | Admitting: Cardiovascular Disease

## 2019-03-04 ENCOUNTER — Ambulatory Visit (INDEPENDENT_AMBULATORY_CARE_PROVIDER_SITE_OTHER): Payer: Medicare Other | Admitting: Cardiovascular Disease

## 2019-03-04 ENCOUNTER — Ambulatory Visit (INDEPENDENT_AMBULATORY_CARE_PROVIDER_SITE_OTHER): Payer: Medicare Other | Admitting: *Deleted

## 2019-03-04 ENCOUNTER — Encounter: Payer: Self-pay | Admitting: Cardiovascular Disease

## 2019-03-04 VITALS — BP 138/64 | HR 59 | Temp 97.1°F | Ht 71.0 in | Wt 210.0 lb

## 2019-03-04 DIAGNOSIS — R0609 Other forms of dyspnea: Secondary | ICD-10-CM | POA: Diagnosis not present

## 2019-03-04 DIAGNOSIS — I482 Chronic atrial fibrillation, unspecified: Secondary | ICD-10-CM | POA: Diagnosis not present

## 2019-03-04 DIAGNOSIS — Z5181 Encounter for therapeutic drug level monitoring: Secondary | ICD-10-CM

## 2019-03-04 DIAGNOSIS — I7121 Aneurysm of the ascending aorta, without rupture: Secondary | ICD-10-CM

## 2019-03-04 DIAGNOSIS — I712 Thoracic aortic aneurysm, without rupture: Secondary | ICD-10-CM

## 2019-03-04 DIAGNOSIS — I4821 Permanent atrial fibrillation: Secondary | ICD-10-CM

## 2019-03-04 DIAGNOSIS — Z8679 Personal history of other diseases of the circulatory system: Secondary | ICD-10-CM

## 2019-03-04 DIAGNOSIS — Z01812 Encounter for preprocedural laboratory examination: Secondary | ICD-10-CM

## 2019-03-04 LAB — POCT INR: INR: 2.4 (ref 2.0–3.0)

## 2019-03-04 NOTE — Telephone Encounter (Signed)
°  Precert needed for: Lexiscan    Location: [facility]    Date: Sept 16, 2020

## 2019-03-04 NOTE — Patient Instructions (Signed)
Continue coumadin 1 tablet daily except 1 1/2 tablets on Tuesdays and Saturdays Recheck in 6 weeks 

## 2019-03-04 NOTE — Progress Notes (Signed)
SUBJECTIVE: The patient presents for follow-up of permanent atrial fibrillation as well as complaints of dizziness and fatigue.  He underwent an echocardiogram on 12/03/2018 which demonstrated normal left ventricular systolic function, LVEF 60 to 65%.  There was severe asymmetric LVH.  There was moderate dilatation of the ascending aorta measuring 44 mm.  Event monitoring demonstrated frequent PVCs and occasional episodes of tachycardia.  He had atrial fibrillation and flutter with an average heart rate of 75 bpm.  There was one 9 beat run and one 11 beat run of nonsustained ventricular tachycardia.  There was an isolated 3.7-second pause at 2:51 PM.  He spoke with one of our pharmacists about [place] therapy but unfortunately cost is prohibitive at this time.  He says he has vertigo.  This primarily occurs when going from the sitting to standing position.  He denies exertional chest pain.  He said he tries to walk most days of the week.  He describes exertional dyspnea when taking his trash can out to the end of the driveway.  He said this is unusual for him but has been going on for the past few years.   Review of Systems: As per "subjective", otherwise negative.  Allergies  Allergen Reactions  . Penicillins Rash    Has patient had a PCN reaction causing immediate rash, facial/tongue/throat swelling, SOB or lightheadedness with hypotension: Yes Has patient had a PCN reaction causing severe rash involving mucus membranes or skin necrosis: No Has patient had a PCN reaction that required hospitalization: No Has patient had a PCN reaction occurring within the last 10 years: No If all of the above answers are "NO", then may proceed with Cephalosporin use.   . Sulfonamide Derivatives Rash    Current Outpatient Medications  Medication Sig Dispense Refill  . alfuzosin (UROXATRAL) 10 MG 24 hr tablet Take 10 mg by mouth at bedtime.     [name] atorvastatin (LIPITOR) 10 MG tablet Take 10 mg by  mouth daily.     . hydrocortisone cream 1 % Apply 1 application topically daily as needed for itching.    . latanoprost (XALATAN) 0.005 % ophthalmic solution Place 1 drop into both eyes once a week.     . metoprolol succinate (TOPROL-XL) 25 MG 24 hr tablet Take 0.5 tablets (12.5 mg total) by mouth daily. 45 tablet 1  . Multiple Vitamins-Minerals (ICAPS AREDS 2) CAPS Take by mouth.    . Omega-3 Fatty Acids (FISH OIL) 1000 MG CAPS Take 1,000 mg by mouth daily.     [name] warfarin (COUMADIN) 5 MG tablet TAKE ONE TABLET BY MOUTH DAILY EXCEPT TAKE 1 AND 1/2 TABLETS ON TUESDAY AND SATURDAY. 45 tablet 4   No current facility-administered medications for this visit.     Past Medical History:  Diagnosis Date  . Atrial flutter, paroxysmal ([place])    a. Hx of TEE with LAA clots. b. s/p ESP/RFCA of atrial flutter 08/2008. b. Recurrence of A-flutter after ablation/atypical, requiring DCCV and amiodaorone.  [name] BPH (benign prostatic hypertrophy)   . Cardiomyopathy ([place])    a. Prior EF 30-35%, felt tachycardia induced cardiomyopathyy. b. Improved after cardioversion and restoration of normal sinus rhythm - EF 65-70% by echo 10/13  . Essential hypertension, benign   . H/O amiodarone therapy    Used for atrial flutter after return of flutter after ablation  . Shortness of breath for last year   when pt lies down   . Urination, excessive at night   .  Warfarin anticoagulation    For atrial flutter    Past Surgical History:  Procedure Laterality Date  . CYSTOSCOPY/RETROGRADE/URETEROSCOPY Right 08/26/2017   Procedure: CYSTOSCOPY/ RIGHT RETROGRADE PYELOGRAM/ RIGHT URETEROSCOPY WITH RIGHT URETERAL STENT PLACEMENT;  Surgeon: Cleon Gustin, MD;  Location: [facility];  Service: Urology;  Laterality: Right;  . FEMORAL HERNIA REPAIR     x2  . TOTAL KNEE ARTHROPLASTY Right 2004  . TRANSURETHRAL PROSTATECTOMY WITH GYRUS INSTRUMENTS N/A 03/12/2013   Procedure: TRANSURETHRAL PROSTATECTOMY WITH GYRUS INSTRUMENTS;  Surgeon:  Ailene Rud, MD;  Location: [facility];  Service: Urology;  Laterality: N/A;  . TURP VAPORIZATION    . UMBILICAL HERNIA REPAIR      Social History   Socioeconomic History  . Marital status: Widowed    Spouse name: Not on file  . Number of children: Not on file  . Years of education: Not on file  . Highest education level: Not on file  Occupational History  . Not on file  Social Needs  . Financial resource strain: Not on file  . Food insecurity    Worry: Not on file    Inability: Not on file  . Transportation needs    Medical: Not on file    Non-medical: Not on file  Tobacco Use  . Smoking status: Never Smoker  . Smokeless tobacco: Never Used  . Tobacco comment: tobacco use- no  Substance and Sexual Activity  . Alcohol use: No    Alcohol/week: 0.0 standard drinks  . Drug use: No  . Sexual activity: Not on file  Lifestyle  . Physical activity    Days per week: Not on file    Minutes per session: Not on file  . Stress: Not on file  Relationships  . Social Herbalist on phone: Not on file    Gets together: Not on file    Attends religious service: Not on file    Active member of club or organization: Not on file    Attends meetings of clubs or organizations: Not on file    Relationship status: Not on file  . Intimate partner violence    Fear of current or ex partner: Not on file    Emotionally abused: Not on file    Physically abused: Not on file    Forced sexual activity: Not on file  Other Topics Concern  . Not on file  Social History Narrative   Retired, married.      Vitals:   03/04/19 1052  BP: 138/64  Pulse: (!) 59  Temp: (!) 97.1 F (36.2 C)  SpO2: 96%  Weight: 210 lb (95.3 kg)  Height: 5\' 11"  (1.803 m)    Wt Readings from Last 3 Encounters:  03/04/19 210 lb (95.3 kg)  11/28/18 215 lb (97.5 kg)  03/25/18 221 lb (100.2 kg)     PHYSICAL EXAM General: NAD HEENT: Normal. Neck: No JVD, no thyromegaly. Lungs: Clear to  auscultation bilaterally with normal respiratory effort. CV: Regular rate and irregular rhythm, normal S1/S2, no S3, no murmur. No pretibial or periankle edema.  No carotid bruit.   Abdomen: Soft, nontender, no distention.  Neurologic: Alert and oriented.  Psych: Normal affect. Skin: Normal. Musculoskeletal: No gross deformities.      Labs: Lab Results  Component Value Date/Time   K 4.2 03/25/2018 11:01 AM   BUN 25 (H) 03/25/2018 11:01 AM   CREATININE 1.20 03/25/2018 11:01 AM   ALT 15 03/25/2018 11:01 AM   TSH  2.046 03/25/2018 11:02 AM   HGB 15.2 03/25/2018 11:01 AM     Lipids: No results found for: LDLCALC, LDLDIRECT, CHOL, TRIG, HDL     ASSESSMENT AND PLAN: 1.  Permanent atrial fibrillation: Heart rate is controlled on Toprol-XL 12.5 mg daily.  I had reduced the dose from 25 mg previously due to soft blood pressures.  He said this improved his symptoms.  Anticoagulated with warfarin. He spoke with one of our pharmacists about [place] therapy but unfortunately cost is prohibitive at this time.  2.  History of cardiomyopathy: Left ventricular systolic function was normal echocardiogram in June 2020 as detailed above.  3.  Ascending aortic aneurysm: I will obtain CT angiography.  4.  Dyspnea on exertion/fatigue: Given runs of nonsustained ventricular tachycardia with event monitoring, I want to rule out an ischemic etiology.  I will obtain a Lexiscan Myoview.    Disposition: Follow up 3 months   Kate Sable, M.D., F.A.C.C.

## 2019-03-04 NOTE — Telephone Encounter (Signed)
°  Precert needed for:CT ANGIO CHEST AORTA W/CM &/OR   Location: [facility]    Date: Sept 23,2020

## 2019-03-04 NOTE — Patient Instructions (Signed)
Medication Instructions:  Continue all current medications.  Labwork: BMET - need to do prior to CT   Testing/Procedures:  CT angio to evaluate aneurysm  Your physician has requested that you have a lexiscan myoview. For further information please visit [email or web address]. Please follow instruction sheet, as given.  Office will contact with results via phone or letter.    Follow-Up: 3 months   Any Other Special Instructions Will Be Listed Below (If Applicable).  If you need a refill on your cardiac medications before your next appointment, please call your pharmacy.

## 2019-03-11 ENCOUNTER — Encounter (HOSPITAL_COMMUNITY): Payer: Medicare Other

## 2019-03-11 ENCOUNTER — Other Ambulatory Visit: Payer: Self-pay

## 2019-03-11 ENCOUNTER — Telehealth: Payer: Self-pay

## 2019-03-11 ENCOUNTER — Encounter (HOSPITAL_BASED_OUTPATIENT_CLINIC_OR_DEPARTMENT_OTHER)
Admission: RE | Admit: 2019-03-11 | Discharge: 2019-03-11 | Disposition: A | Discharge status: Home / Self Care | Payer: Medicare Other | Source: Ambulatory Visit | Attending: Cardiovascular Disease | Admitting: Cardiovascular Disease

## 2019-03-11 ENCOUNTER — Ambulatory Visit (HOSPITAL_COMMUNITY)
Admission: RE | Admit: 2019-03-11 | Discharge: 2019-03-11 | Disposition: A | Discharge status: Home / Self Care | Payer: Medicare Other | Source: Ambulatory Visit | Attending: Internal Medicine | Admitting: Internal Medicine

## 2019-03-11 DIAGNOSIS — R0609 Other forms of dyspnea: Secondary | ICD-10-CM | POA: Insufficient documentation

## 2019-03-11 LAB — NM MYOCAR MULTI W/SPECT W/WALL MOTION / EF
LV dias vol: 76 mL (ref 62–150)
LV sys vol: 31 mL
Peak HR: 103 {beats}/min
RATE: 0.41
Rest HR: 80 {beats}/min
SDS: 3
SRS: 3
SSS: 6
TID: 1.02

## 2019-03-11 MED ORDER — SODIUM CHLORIDE 0.9% FLUSH
INTRAVENOUS | Status: AC
  Administered 2019-03-11: 10:00:00 10 mL via INTRAVENOUS
  Filled 2019-03-11: qty 10

## 2019-03-11 MED ORDER — TECHNETIUM TC 99M TETROFOSMIN IV KIT
10.0000 | PACK | Freq: Once | INTRAVENOUS | Status: AC | PRN
  Administered 2019-03-11: 9.2 via INTRAVENOUS

## 2019-03-11 MED ORDER — REGADENOSON 0.4 MG/5ML IV SOLN
INTRAVENOUS | Status: AC
  Administered 2019-03-11: 0.4 mg via INTRAVENOUS
  Filled 2019-03-11: qty 5

## 2019-03-11 MED ORDER — TECHNETIUM TC 99M TETROFOSMIN IV KIT
30.0000 | PACK | Freq: Once | INTRAVENOUS | Status: AC | PRN
  Administered 2019-03-11: 10:00:00 30 via INTRAVENOUS

## 2019-03-11 NOTE — Telephone Encounter (Signed)
Called pt. No answer. Left message for pt to return call.  

## 2019-03-11 NOTE — Telephone Encounter (Signed)
-----   Message from Herminio Commons, MD sent at 03/11/2019  3:55 PM EDT ----- No evidence of blockages.

## 2019-03-16 ENCOUNTER — Other Ambulatory Visit (HOSPITAL_COMMUNITY)
Admission: RE | Admit: 2019-03-16 | Discharge: 2019-03-16 | Disposition: A | Discharge status: Home / Self Care | Payer: Medicare Other | Source: Ambulatory Visit | Attending: Cardiovascular Disease | Admitting: Cardiovascular Disease

## 2019-03-16 DIAGNOSIS — R0609 Other forms of dyspnea: Secondary | ICD-10-CM | POA: Diagnosis present

## 2019-03-16 DIAGNOSIS — Z01812 Encounter for preprocedural laboratory examination: Secondary | ICD-10-CM | POA: Insufficient documentation

## 2019-03-16 LAB — BASIC METABOLIC PANEL
Anion gap: 8 (ref 5–15)
BUN: 27 mg/dL — ABNORMAL HIGH (ref 8–23)
CO2: 27 mmol/L (ref 22–32)
Calcium: 8.9 mg/dL (ref 8.9–10.3)
Chloride: 104 mmol/L (ref 98–111)
Creatinine, Ser: 1.03 mg/dL (ref 0.61–1.24)
GFR calc Af Amer: >60 mL/min (ref >60)
GFR calc non Af Amer: >60 mL/min (ref >60)
Glucose, Bld: 131 mg/dL — ABNORMAL HIGH (ref 70–99)
Potassium: 4.1 mmol/L (ref 3.5–5.1)
Sodium: 139 mmol/L (ref 135–145)

## 2019-03-18 ENCOUNTER — Other Ambulatory Visit: Payer: Self-pay

## 2019-03-18 ENCOUNTER — Telehealth: Payer: Self-pay | Admitting: *Deleted

## 2019-03-18 ENCOUNTER — Ambulatory Visit (HOSPITAL_COMMUNITY)
Admission: RE | Admit: 2019-03-18 | Discharge: 2019-03-18 | Disposition: A | Discharge status: Home / Self Care | Payer: Medicare Other | Source: Ambulatory Visit | Attending: Cardiovascular Disease | Admitting: Cardiovascular Disease

## 2019-03-18 DIAGNOSIS — I7121 Aneurysm of the ascending aorta, without rupture: Secondary | ICD-10-CM

## 2019-03-18 DIAGNOSIS — I712 Thoracic aortic aneurysm, without rupture: Secondary | ICD-10-CM | POA: Insufficient documentation

## 2019-03-18 MED ORDER — IOHEXOL 350 MG/ML SOLN
100.0000 mL | Freq: Once | INTRAVENOUS | Status: AC | PRN
  Administered 2019-03-18: 09:00:00 100 mL via INTRAVENOUS

## 2019-03-18 NOTE — Telephone Encounter (Signed)
[facility] radiology called regarding CT chest angio report asking that be printed and given to Dr. Bronson Ing. Report Printed and given to Dr. Bronson Ing. Report reviewed and requested that it be forwarded to PCP.

## 2019-04-02 ENCOUNTER — Telehealth: Payer: Self-pay | Admitting: Cardiovascular Disease

## 2019-04-02 NOTE — Telephone Encounter (Signed)
Returning call.

## 2019-04-03 NOTE — Telephone Encounter (Signed)
Notes recorded by Laurine Blazer, LPN on 075-GRM at 075-GRM AM EDT  Patient notified. Copy to pmd. Follow up scheduled for December.    ------   Notes recorded by Laurine Blazer, LPN on 075-GRM at [id number] AM EDT  Left message to return call.   ------   Notes recorded by Laurine Blazer, LPN on [id number] at D34-534 AM EDT  Left message to return call.   ------   Notes recorded by Herminio Commons, MD on 03/18/2019 at 12:25 PM EDT  No evidence of a thoracic aortic aneurysm. There was a left lobe thyroid mass which needs to be followed up by his PCP. He also had several nodular opacities in his lung which will also need follow-up by his PCP. There was also a left adrenal mass of uncertain significance.

## 2019-04-16 ENCOUNTER — Ambulatory Visit (INDEPENDENT_AMBULATORY_CARE_PROVIDER_SITE_OTHER): Payer: Medicare Other | Admitting: *Deleted

## 2019-04-16 ENCOUNTER — Other Ambulatory Visit: Payer: Self-pay

## 2019-04-16 DIAGNOSIS — I482 Chronic atrial fibrillation, unspecified: Secondary | ICD-10-CM | POA: Diagnosis not present

## 2019-04-16 DIAGNOSIS — Z5181 Encounter for therapeutic drug level monitoring: Secondary | ICD-10-CM

## 2019-04-16 LAB — POCT INR: INR: 2.5 (ref 2.0–3.0)

## 2019-04-16 NOTE — Patient Instructions (Signed)
Continue coumadin 1 tablet daily except 1 1/2 tablets on Tuesdays and Saturdays Recheck in 6 weeks 

## 2019-05-28 ENCOUNTER — Other Ambulatory Visit: Payer: Self-pay

## 2019-05-28 ENCOUNTER — Ambulatory Visit (INDEPENDENT_AMBULATORY_CARE_PROVIDER_SITE_OTHER): Payer: Medicare Other | Admitting: *Deleted

## 2019-05-28 DIAGNOSIS — Z5181 Encounter for therapeutic drug level monitoring: Secondary | ICD-10-CM | POA: Diagnosis not present

## 2019-05-28 DIAGNOSIS — I482 Chronic atrial fibrillation, unspecified: Secondary | ICD-10-CM | POA: Diagnosis not present

## 2019-05-28 LAB — POCT INR: INR: 3 (ref 2.0–3.0)

## 2019-05-28 NOTE — Patient Instructions (Signed)
Continue coumadin 1 tablet daily except 1 1/2 tablets on Tuesdays and Saturdays Recheck in 6 weeks 

## 2019-06-03 ENCOUNTER — Ambulatory Visit: Payer: Medicare Other | Admitting: Student

## 2019-06-03 NOTE — Progress Notes (Deleted)
Cardiology Office Note    Date:  06/03/2019   ID:  [name], DOB 1933/12/26, MRN [id number]  PCP:  Glenda Chroman, MD  Cardiologist: Kate Sable, MD    No chief complaint on file.   History of Present Illness:    [name] is a 85 y.o. male past medical history of permanent atrial fibrillation, flutter, tachycardia mediated cardiomyopathy (EF previously 30 to 35%, improved to 65 to 70% by echo in 2013 and at 60 to 65% by imaging in 11/2018), HTN and prior syncope who presents to the office today for 20-month follow-up.  He was last examined by Dr. Bronson Ing in 02/2019 and reported having intermittent episodes of dizziness when changing positions which was thought to be most consistent with vertigo.  He did report worsening dyspnea on exertion and a Lexiscan Myoview was recommended for ischemic evaluation given his episodes of NSVT on his event monitor. NST showed no evidence of ischemia and was overall a low risk study. Given his history of an aortic aneurysm, a CTA was obtained which showed no evidence of a thoracic aortic aneurysm and no evidence of a dissection. He did have a dominant left lobe thyroid mass and was also noted to have several lung nodules with repeat imaging recommended in 3 months. Was also found to have an adrenal mass and follow-up CT was recommended in 1 year to further evaluate.  Past Medical History:  Diagnosis Date  . Atrial flutter, paroxysmal ([place])    a. Hx of TEE with LAA clots. b. s/p ESP/RFCA of atrial flutter 08/2008. b. Recurrence of A-flutter after ablation/atypical, requiring DCCV and amiodaorone.  [name] BPH (benign prostatic hypertrophy)   . Cardiomyopathy ([place])    a. Prior EF 30-35%, felt tachycardia induced cardiomyopathyy. b. Improved after cardioversion and restoration of normal sinus rhythm - EF 65-70% by echo 10/13  . Essential hypertension, benign   . H/O amiodarone therapy    Used for atrial flutter after return of flutter after  ablation  . Shortness of breath for last year   when pt lies down   . Urination, excessive at night   . Warfarin anticoagulation    For atrial flutter    Past Surgical History:  Procedure Laterality Date  . CYSTOSCOPY/RETROGRADE/URETEROSCOPY Right 08/26/2017   Procedure: CYSTOSCOPY/ RIGHT RETROGRADE PYELOGRAM/ RIGHT URETEROSCOPY WITH RIGHT URETERAL STENT PLACEMENT;  Surgeon: Cleon Gustin, MD;  Location: [facility];  Service: Urology;  Laterality: Right;  . FEMORAL HERNIA REPAIR     x2  . TOTAL KNEE ARTHROPLASTY Right 2004  . TRANSURETHRAL PROSTATECTOMY WITH GYRUS INSTRUMENTS N/A 03/12/2013   Procedure: TRANSURETHRAL PROSTATECTOMY WITH GYRUS INSTRUMENTS;  Surgeon: Ailene Rud, MD;  Location: [facility];  Service: Urology;  Laterality: N/A;  . TURP VAPORIZATION    . UMBILICAL HERNIA REPAIR      Current Medications: Outpatient Medications Prior to Visit  Medication Sig Dispense Refill  . alfuzosin (UROXATRAL) 10 MG 24 hr tablet Take 10 mg by mouth at bedtime.     [name] atorvastatin (LIPITOR) 10 MG tablet Take 10 mg by mouth daily.     . hydrocortisone cream 1 % Apply 1 application topically daily as needed for itching.    . latanoprost (XALATAN) 0.005 % ophthalmic solution Place 1 drop into both eyes once a week.     . metoprolol succinate (TOPROL-XL) 25 MG 24 hr tablet Take 0.5 tablets (12.5 mg total) by mouth daily. 45 tablet 1  . Multiple Vitamins-Minerals (ICAPS  AREDS 2) CAPS Take by mouth.    . Omega-3 Fatty Acids (FISH OIL) 1000 MG CAPS Take 1,000 mg by mouth daily.     [name] warfarin (COUMADIN) 5 MG tablet TAKE ONE TABLET BY MOUTH DAILY EXCEPT TAKE 1 AND 1/2 TABLETS ON TUESDAY AND SATURDAY. 45 tablet 4   No facility-administered medications prior to visit.      Allergies:   Penicillins and Sulfonamide derivatives   Social History   Socioeconomic History  . Marital status: Widowed    Spouse name: Not on file  . Number of children: Not on file  . Years of education: Not on  file  . Highest education level: Not on file  Occupational History  . Not on file  Social Needs  . Financial resource strain: Not on file  . Food insecurity    Worry: Not on file    Inability: Not on file  . Transportation needs    Medical: Not on file    Non-medical: Not on file  Tobacco Use  . Smoking status: Never Smoker  . Smokeless tobacco: Never Used  . Tobacco comment: tobacco use- no  Substance and Sexual Activity  . Alcohol use: No    Alcohol/week: 0.0 standard drinks  . Drug use: No  . Sexual activity: Not on file  Lifestyle  . Physical activity    Days per week: Not on file    Minutes per session: Not on file  . Stress: Not on file  Relationships  . Social Herbalist on phone: Not on file    Gets together: Not on file    Attends religious service: Not on file    Active member of club or organization: Not on file    Attends meetings of clubs or organizations: Not on file    Relationship status: Not on file  Other Topics Concern  . Not on file  Social History Narrative   Retired, married.      Family History:  The patient's ***family history includes Dementia in his father and mother; Diabetes in his mother and paternal grandmother.   Review of Systems:   Please see the history of present illness.     General:  No chills, fever, night sweats or weight changes.  Cardiovascular:  No chest pain, dyspnea on exertion, edema, orthopnea, palpitations, paroxysmal nocturnal dyspnea. Dermatological: No rash, lesions/masses Respiratory: No cough, dyspnea Urologic: No hematuria, dysuria Abdominal:   No nausea, vomiting, diarrhea, bright red blood per rectum, melena, or hematemesis Neurologic:  No visual changes, wkns, changes in mental status. All other systems reviewed and are otherwise negative except as noted above.   Physical Exam:    VS:  There were no vitals taken for this visit.   General: Well developed, well nourished,male appearing in no  acute distress. Head: Normocephalic, atraumatic, sclera non-icteric, no xanthomas, nares are without discharge.  Neck: No carotid bruits. JVD not elevated.  Lungs: Respirations regular and unlabored, without wheezes or rales.  Heart: ***Regular rate and rhythm. No S3 or S4.  No murmur, no rubs, or gallops appreciated. Abdomen: Soft, non-tender, non-distended with normoactive bowel sounds. No hepatomegaly. No rebound/guarding. No obvious abdominal masses. Msk:  Strength and tone appear normal for age. No joint deformities or effusions. Extremities: No clubbing or cyanosis. No edema.  Distal pedal pulses are 2+ bilaterally. Neuro: Alert and oriented X 3. Moves all extremities spontaneously. No focal deficits noted. Psych:  Responds to questions appropriately with a normal affect. Skin:  No rashes or lesions noted  Wt Readings from Last 3 Encounters:  03/04/19 210 lb (95.3 kg)  11/28/18 215 lb (97.5 kg)  03/25/18 221 lb (100.2 kg)        Studies/Labs Reviewed:   EKG:  EKG is*** ordered today.  The ekg ordered today demonstrates ***  Recent Labs: 03/16/2019: BUN 27; Creatinine, Ser 1.03; Potassium 4.1; Sodium 139   Lipid Panel No results found for: CHOL, TRIG, HDL, CHOLHDL, VLDL, LDLCALC, LDLDIRECT  Additional studies/ records that were reviewed today include:   Echocardiogram: 11/2018 IMPRESSIONS    1. The left ventricle has normal systolic function with an ejection fraction of 60-65%. The cavity size was normal. There is severe asymmetric left ventricular hypertrophy. Left ventricular diastolic Doppler parameters are indeterminate in the setting  of apparent atrial flutter. No evidence of left ventricular regional wall motion abnormalities.  2. The right ventricle has normal systolic function. The cavity was normal. There is no increase in right ventricular wall thickness. Right ventricular systolic pressure is mildly elevated with an estimated pressure of 36.7 mmHg.  3. Left  atrial size was mildly dilated.  4. Right atrial size was mildly dilated.  5. The aortic valve is tricuspid. Mild aortic annular calcification noted.  6. The mitral valve is grossly normal.  7. The tricuspid valve is grossly normal.  8. There is moderate dilatation of the ascending aorta measuring 44 mm.  9. The inferior vena cava was dilated in size with >50% respiratory variability.  Event Monitor: 11/2018  Atrial fibrillation and flutter noted. Average heart rate 75 bpm.  There were frequent PVCs and multiple 3 beat runs of nonsustained ventricular tachycardia, one 9 beat run, and one 11 beat run of nonsustained ventricular tachycardia.  There was a 3.7-second pause at 2:51 PM.  NST: 02/2019  There was no ST segment deviation noted during stress.  The study is normal. No ischemic zones.  This is a low risk study.  Nuclear stress EF: 59%.  Assessment:    No diagnosis found.   Plan:   In order of problems listed above:  1. ***    Medication Adjustments/Labs and Tests Ordered: Current medicines are reviewed at length with the patient today.  Concerns regarding medicines are outlined above.  Medication changes, Labs and Tests ordered today are listed in the Patient Instructions below. There are no Patient Instructions on file for this visit.   Signed, [name], PA-C  06/03/2019 12:57 PM    [facility] [address] [place], [place] [address] Phone: [phone] Fax: [phone]

## 2019-06-04 ENCOUNTER — Ambulatory Visit (INDEPENDENT_AMBULATORY_CARE_PROVIDER_SITE_OTHER): Payer: Medicare Other | Admitting: Otolaryngology

## 2019-06-05 ENCOUNTER — Encounter: Payer: Self-pay | Admitting: Student

## 2019-06-29 ENCOUNTER — Other Ambulatory Visit: Payer: Self-pay | Admitting: Cardiovascular Disease

## 2019-07-06 ENCOUNTER — Other Ambulatory Visit: Payer: Self-pay | Admitting: Cardiovascular Disease

## 2019-07-06 DIAGNOSIS — I4891 Unspecified atrial fibrillation: Secondary | ICD-10-CM | POA: Diagnosis not present

## 2019-07-06 DIAGNOSIS — E7800 Pure hypercholesterolemia, unspecified: Secondary | ICD-10-CM | POA: Diagnosis not present

## 2019-07-06 NOTE — Telephone Encounter (Signed)
Rx sent in

## 2019-07-08 ENCOUNTER — Ambulatory Visit (INDEPENDENT_AMBULATORY_CARE_PROVIDER_SITE_OTHER): Payer: Medicare Other | Admitting: *Deleted

## 2019-07-08 ENCOUNTER — Other Ambulatory Visit: Payer: Self-pay

## 2019-07-08 DIAGNOSIS — Z5181 Encounter for therapeutic drug level monitoring: Secondary | ICD-10-CM

## 2019-07-08 DIAGNOSIS — I482 Chronic atrial fibrillation, unspecified: Secondary | ICD-10-CM

## 2019-07-08 LAB — POCT INR: INR: 3.5 — AB (ref 2.0–3.0)

## 2019-07-08 NOTE — Patient Instructions (Signed)
Hold warfarin tonight then resume 1 tablet daily except 1 1/2 tablets on Tuesdays and Saturdays Recheck in 4 weeks

## 2019-07-14 DIAGNOSIS — Z299 Encounter for prophylactic measures, unspecified: Secondary | ICD-10-CM | POA: Diagnosis not present

## 2019-07-14 DIAGNOSIS — I270 Primary pulmonary hypertension: Secondary | ICD-10-CM | POA: Diagnosis not present

## 2019-07-14 DIAGNOSIS — I10 Essential (primary) hypertension: Secondary | ICD-10-CM | POA: Diagnosis not present

## 2019-07-14 DIAGNOSIS — E041 Nontoxic single thyroid nodule: Secondary | ICD-10-CM | POA: Diagnosis not present

## 2019-07-14 DIAGNOSIS — I739 Peripheral vascular disease, unspecified: Secondary | ICD-10-CM | POA: Diagnosis not present

## 2019-07-14 DIAGNOSIS — I4891 Unspecified atrial fibrillation: Secondary | ICD-10-CM | POA: Diagnosis not present

## 2019-07-24 DIAGNOSIS — D440 Neoplasm of uncertain behavior of thyroid gland: Secondary | ICD-10-CM | POA: Diagnosis not present

## 2019-08-04 DIAGNOSIS — I4891 Unspecified atrial fibrillation: Secondary | ICD-10-CM | POA: Diagnosis not present

## 2019-08-04 DIAGNOSIS — E7800 Pure hypercholesterolemia, unspecified: Secondary | ICD-10-CM | POA: Diagnosis not present

## 2019-08-12 ENCOUNTER — Other Ambulatory Visit: Payer: Self-pay

## 2019-08-12 ENCOUNTER — Ambulatory Visit (INDEPENDENT_AMBULATORY_CARE_PROVIDER_SITE_OTHER): Payer: Medicare Other | Admitting: *Deleted

## 2019-08-12 DIAGNOSIS — I482 Chronic atrial fibrillation, unspecified: Secondary | ICD-10-CM

## 2019-08-12 DIAGNOSIS — Z5181 Encounter for therapeutic drug level monitoring: Secondary | ICD-10-CM

## 2019-08-12 LAB — POCT INR: INR: 2.2 (ref 2.0–3.0)

## 2019-08-12 NOTE — Patient Instructions (Signed)
Continue warfarin 1 tablet daily except 1 1/2 tablets on Tuesdays and Saturdays Recheck in 4 weeks 

## 2019-08-17 DIAGNOSIS — M1710 Unilateral primary osteoarthritis, unspecified knee: Secondary | ICD-10-CM | POA: Diagnosis not present

## 2019-08-17 DIAGNOSIS — I4891 Unspecified atrial fibrillation: Secondary | ICD-10-CM | POA: Diagnosis not present

## 2019-08-17 DIAGNOSIS — I10 Essential (primary) hypertension: Secondary | ICD-10-CM | POA: Diagnosis not present

## 2019-08-17 DIAGNOSIS — I712 Thoracic aortic aneurysm, without rupture: Secondary | ICD-10-CM | POA: Diagnosis not present

## 2019-08-17 DIAGNOSIS — Z299 Encounter for prophylactic measures, unspecified: Secondary | ICD-10-CM | POA: Diagnosis not present

## 2019-08-24 DIAGNOSIS — M1712 Unilateral primary osteoarthritis, left knee: Secondary | ICD-10-CM | POA: Diagnosis not present

## 2019-08-24 DIAGNOSIS — I739 Peripheral vascular disease, unspecified: Secondary | ICD-10-CM | POA: Diagnosis not present

## 2019-08-24 DIAGNOSIS — I4891 Unspecified atrial fibrillation: Secondary | ICD-10-CM | POA: Diagnosis not present

## 2019-08-24 DIAGNOSIS — M1710 Unilateral primary osteoarthritis, unspecified knee: Secondary | ICD-10-CM | POA: Diagnosis not present

## 2019-08-24 DIAGNOSIS — I712 Thoracic aortic aneurysm, without rupture: Secondary | ICD-10-CM | POA: Diagnosis not present

## 2019-08-24 DIAGNOSIS — Z299 Encounter for prophylactic measures, unspecified: Secondary | ICD-10-CM | POA: Diagnosis not present

## 2019-08-24 DIAGNOSIS — I10 Essential (primary) hypertension: Secondary | ICD-10-CM | POA: Diagnosis not present

## 2019-08-27 ENCOUNTER — Ambulatory Visit (INDEPENDENT_AMBULATORY_CARE_PROVIDER_SITE_OTHER): Payer: Medicare Other | Admitting: Cardiovascular Disease

## 2019-08-27 ENCOUNTER — Encounter: Payer: Self-pay | Admitting: Cardiovascular Disease

## 2019-08-27 ENCOUNTER — Other Ambulatory Visit: Payer: Self-pay

## 2019-08-27 VITALS — BP 130/88 | HR 84 | Ht 71.0 in | Wt 206.0 lb

## 2019-08-27 DIAGNOSIS — R5383 Other fatigue: Secondary | ICD-10-CM

## 2019-08-27 DIAGNOSIS — Z8679 Personal history of other diseases of the circulatory system: Secondary | ICD-10-CM

## 2019-08-27 DIAGNOSIS — I482 Chronic atrial fibrillation, unspecified: Secondary | ICD-10-CM | POA: Diagnosis not present

## 2019-08-27 DIAGNOSIS — Z7901 Long term (current) use of anticoagulants: Secondary | ICD-10-CM

## 2019-08-27 DIAGNOSIS — I4821 Permanent atrial fibrillation: Secondary | ICD-10-CM

## 2019-08-27 NOTE — Progress Notes (Signed)
SUBJECTIVE: The patient presents for follow-up of permanent atrial fibrillation.  Impression reviewed ECG performed today which demonstrates rate controlled atrial fibrillation with incomplete right bundle branch block.  He underwent an echocardiogram on 12/03/2018 which demonstrated normal left ventricular systolic function, LVEF 60 to 65%.  There was severe asymmetric LVH.  There was moderate dilatation of the ascending aorta measuring 44 mm.  Event monitoring demonstrated frequent PVCs and occasional episodes of tachycardia.  He had atrial fibrillation and flutter with an average heart rate of 75 bpm.  There was one 9 beat run and one 11 beat run of nonsustained ventricular tachycardia.  There was an isolated 3.7-second pause at 2:51 PM.  He previously spoke with one of our pharmacists about [place] therapy but unfortunately cost is prohibitive at this time.  CT angiography of the chest on 03/18/2019 showed no demonstrable thoracic arctic aneurysm.  There was a dominant left lobe thyroid mass measuring 4 x 3.9 cm.  There were several nodular lung opacities and mild lower lobe bronchiectatic changes bilaterally.  There is also a 1.7 x 1.5 cm left adrenal mass.  There is a small hiatal hernia.  He complains of vertigo.  He denies syncope.  He also said his hands are numb.  He has difficulty buttoning his shirts.  He was diagnosed with COVID-19 and said he got over it about a month ago.  He still feels somewhat fatigued.  He did receive the vaccine.      Review of Systems: As per "subjective", otherwise negative.  Allergies  Allergen Reactions  . Penicillins Rash    Has patient had a PCN reaction causing immediate rash, facial/tongue/throat swelling, SOB or lightheadedness with hypotension: Yes Has patient had a PCN reaction causing severe rash involving mucus membranes or skin necrosis: No Has patient had a PCN reaction that required hospitalization: No Has patient had a  PCN reaction occurring within the last 10 years: No If all of the above answers are "NO", then may proceed with Cephalosporin use.   . Sulfonamide Derivatives Rash    Current Outpatient Medications  Medication Sig Dispense Refill  . alfuzosin (UROXATRAL) 10 MG 24 hr tablet Take 10 mg by mouth at bedtime.     [name] atorvastatin (LIPITOR) 10 MG tablet Take 10 mg by mouth daily.     . hydrocortisone cream 1 % Apply 1 application topically daily as needed for itching.    . latanoprost (XALATAN) 0.005 % ophthalmic solution Place 1 drop into both eyes once a week.     . metoprolol succinate (TOPROL-XL) 25 MG 24 hr tablet TAKE 1/2 TABLET BY MOUTH DAILY. 45 tablet 0  . Multiple Vitamins-Minerals (ICAPS AREDS 2) CAPS Take by mouth.    . Omega-3 Fatty Acids (FISH OIL) 1000 MG CAPS Take 1,000 mg by mouth daily.     [name] warfarin (COUMADIN) 5 MG tablet TAKE ONE TABLET BY MOUTH DAILY EXCEPT TAKE 1 AND 1/2 TABLETS ON TUESDAY AND SATURDAY. 45 tablet 4   No current facility-administered medications for this visit.    Past Medical History:  Diagnosis Date  . Atrial flutter, paroxysmal ([place])    a. Hx of TEE with LAA clots. b. s/p ESP/RFCA of atrial flutter 08/2008. b. Recurrence of A-flutter after ablation/atypical, requiring DCCV and amiodaorone.  [name] BPH (benign prostatic hypertrophy)   . Cardiomyopathy ([place])    a. Prior EF 30-35%, felt tachycardia induced cardiomyopathyy. b. Improved after cardioversion and restoration of normal sinus rhythm - EF  65-70% by echo 10/13  . Essential hypertension, benign   . H/O amiodarone therapy    Used for atrial flutter after return of flutter after ablation  . Shortness of breath for last year   when pt lies down   . Urination, excessive at night   . Warfarin anticoagulation    For atrial flutter    Past Surgical History:  Procedure Laterality Date  . CYSTOSCOPY/RETROGRADE/URETEROSCOPY Right 08/26/2017   Procedure: CYSTOSCOPY/ RIGHT RETROGRADE PYELOGRAM/ RIGHT  URETEROSCOPY WITH RIGHT URETERAL STENT PLACEMENT;  Surgeon: Cleon Gustin, MD;  Location: [facility];  Service: Urology;  Laterality: Right;  . FEMORAL HERNIA REPAIR     x2  . TOTAL KNEE ARTHROPLASTY Right 2004  . TRANSURETHRAL PROSTATECTOMY WITH GYRUS INSTRUMENTS N/A 03/12/2013   Procedure: TRANSURETHRAL PROSTATECTOMY WITH GYRUS INSTRUMENTS;  Surgeon: Ailene Rud, MD;  Location: [facility];  Service: Urology;  Laterality: N/A;  . TURP VAPORIZATION    . UMBILICAL HERNIA REPAIR      Social History   Socioeconomic History  . Marital status: Widowed    Spouse name: Not on file  . Number of children: Not on file  . Years of education: Not on file  . Highest education level: Not on file  Occupational History  . Not on file  Tobacco Use  . Smoking status: Never Smoker  . Smokeless tobacco: Never Used  . Tobacco comment: tobacco use- no  Substance and Sexual Activity  . Alcohol use: No    Alcohol/week: 0.0 standard drinks  . Drug use: No  . Sexual activity: Not on file  Other Topics Concern  . Not on file  Social History Narrative   Retired, married.    Social Determinants of Health   Financial Resource Strain:   . Difficulty of Paying Living Expenses: Not on file  Food Insecurity:   . Worried About Charity fundraiser in the Last Year: Not on file  . Ran Out of Food in the Last Year: Not on file  Transportation Needs:   . Lack of Transportation (Medical): Not on file  . Lack of Transportation (Non-Medical): Not on file  Physical Activity:   . Days of Exercise per Week: Not on file  . Minutes of Exercise per Session: Not on file  Stress:   . Feeling of Stress : Not on file  Social Connections:   . Frequency of Communication with Friends and Family: Not on file  . Frequency of Social Gatherings with Friends and Family: Not on file  . Attends Religious Services: Not on file  . Active Member of Clubs or Organizations: Not on file  . Attends Archivist  Meetings: Not on file  . Marital Status: Not on file  Intimate Partner Violence:   . Fear of Current or Ex-Partner: Not on file  . Emotionally Abused: Not on file  . Physically Abused: Not on file  . Sexually Abused: Not on file     Vitals:   08/27/19 1422  BP: 130/88  Pulse: 84  SpO2: 97%  Weight: 206 lb (93.4 kg)  Height: 5\' 11"  (1.803 m)    Wt Readings from Last 3 Encounters:  08/27/19 206 lb (93.4 kg)  03/04/19 210 lb (95.3 kg)  11/28/18 215 lb (97.5 kg)     PHYSICAL EXAM General: NAD HEENT: Normal. Neck: No JVD, no thyromegaly. Lungs: Clear to auscultation bilaterally with normal respiratory effort. CV: Regular rate and irregular rhythm, normal S1/S2, no S3, no murmur. No  pretibial or periankle edema.    Abdomen: Soft, nontender, no distention.  Neurologic: Alert and oriented.  Psych: Normal affect. Skin: Normal. Musculoskeletal: No gross deformities.      Labs: Lab Results  Component Value Date/Time   K 4.1 03/16/2019 08:34 AM   BUN 27 (H) 03/16/2019 08:34 AM   CREATININE 1.03 03/16/2019 08:34 AM   ALT 15 03/25/2018 11:01 AM   TSH 2.046 03/25/2018 11:02 AM   HGB 15.2 03/25/2018 11:01 AM     Lipids: No results found for: LDLCALC, LDLDIRECT, CHOL, TRIG, HDL     ASSESSMENT AND PLAN:  1.  Permanent atrial fibrillation: Heart rate is controlled on Toprol-XL 12.5 mg daily.  I had reduced the dose from 25 mg previously due to soft blood pressures.  He said this improved his symptoms.  Anticoagulated with warfarin. He spoke with one of our pharmacists about [place] therapy but unfortunately cost is prohibitive at this time.  2.  History of cardiomyopathy: Left ventricular systolic function was normal echocardiogram in June 2020 as detailed above.  LVEF 59% by nuclear stress test on 03/11/2019.  4.  Dyspnea on exertion/fatigue: Nuclear stress test was normal on 03/11/2019, EF 59%.  Disposition: Follow up 1 yr   Kate Sable, M.D., F.A.C.C.

## 2019-08-27 NOTE — Patient Instructions (Signed)

## 2019-09-04 DIAGNOSIS — E7800 Pure hypercholesterolemia, unspecified: Secondary | ICD-10-CM | POA: Diagnosis not present

## 2019-09-04 DIAGNOSIS — I4891 Unspecified atrial fibrillation: Secondary | ICD-10-CM | POA: Diagnosis not present

## 2019-09-07 DIAGNOSIS — M1712 Unilateral primary osteoarthritis, left knee: Secondary | ICD-10-CM | POA: Diagnosis not present

## 2019-09-07 DIAGNOSIS — Z789 Other specified health status: Secondary | ICD-10-CM | POA: Diagnosis not present

## 2019-09-07 DIAGNOSIS — R262 Difficulty in walking, not elsewhere classified: Secondary | ICD-10-CM | POA: Diagnosis not present

## 2019-09-07 DIAGNOSIS — M2560 Stiffness of unspecified joint, not elsewhere classified: Secondary | ICD-10-CM | POA: Diagnosis not present

## 2019-09-07 DIAGNOSIS — I10 Essential (primary) hypertension: Secondary | ICD-10-CM | POA: Diagnosis not present

## 2019-09-07 DIAGNOSIS — R260 Ataxic gait: Secondary | ICD-10-CM | POA: Diagnosis not present

## 2019-09-07 DIAGNOSIS — Z299 Encounter for prophylactic measures, unspecified: Secondary | ICD-10-CM | POA: Diagnosis not present

## 2019-09-09 ENCOUNTER — Ambulatory Visit (INDEPENDENT_AMBULATORY_CARE_PROVIDER_SITE_OTHER): Payer: Medicare Other | Admitting: *Deleted

## 2019-09-09 ENCOUNTER — Other Ambulatory Visit: Payer: Self-pay

## 2019-09-09 DIAGNOSIS — I482 Chronic atrial fibrillation, unspecified: Secondary | ICD-10-CM

## 2019-09-09 DIAGNOSIS — Z5181 Encounter for therapeutic drug level monitoring: Secondary | ICD-10-CM | POA: Diagnosis not present

## 2019-09-09 LAB — POCT INR: INR: 2.4 (ref 2.0–3.0)

## 2019-09-09 NOTE — Patient Instructions (Signed)
Continue warfarin 1 tablet daily except 1 1/2 tablets on Tuesdays and Saturdays Recheck in 6 weeks 

## 2019-09-24 DIAGNOSIS — E7800 Pure hypercholesterolemia, unspecified: Secondary | ICD-10-CM | POA: Diagnosis not present

## 2019-09-24 DIAGNOSIS — I4891 Unspecified atrial fibrillation: Secondary | ICD-10-CM | POA: Diagnosis not present

## 2019-10-21 ENCOUNTER — Other Ambulatory Visit (HOSPITAL_COMMUNITY): Payer: Self-pay | Admitting: Otolaryngology

## 2019-10-21 DIAGNOSIS — E041 Nontoxic single thyroid nodule: Secondary | ICD-10-CM

## 2019-10-26 ENCOUNTER — Other Ambulatory Visit (HOSPITAL_COMMUNITY): Payer: Self-pay | Admitting: Otolaryngology

## 2019-10-26 ENCOUNTER — Ambulatory Visit
Admission: RE | Admit: 2019-10-26 | Discharge: 2019-10-26 | Disposition: A | Discharge status: Home / Self Care | Payer: Self-pay | Source: Ambulatory Visit | Attending: Otolaryngology | Admitting: Otolaryngology

## 2019-10-26 DIAGNOSIS — M76899 Other specified enthesopathies of unspecified lower limb, excluding foot: Secondary | ICD-10-CM | POA: Diagnosis not present

## 2019-10-26 DIAGNOSIS — E041 Nontoxic single thyroid nodule: Secondary | ICD-10-CM

## 2019-10-26 DIAGNOSIS — Z299 Encounter for prophylactic measures, unspecified: Secondary | ICD-10-CM | POA: Diagnosis not present

## 2019-10-26 DIAGNOSIS — I4891 Unspecified atrial fibrillation: Secondary | ICD-10-CM | POA: Diagnosis not present

## 2019-10-26 DIAGNOSIS — I739 Peripheral vascular disease, unspecified: Secondary | ICD-10-CM | POA: Diagnosis not present

## 2019-10-30 ENCOUNTER — Other Ambulatory Visit: Payer: Self-pay | Admitting: Cardiovascular Disease

## 2019-11-09 IMAGING — US US RENAL
1 series · 14 of 25 positions shown · non-contrast
Comparison: 06/24/2017 CT

CLINICAL DATA: Hydronephrosis with hydroureteral junction
obstruction.

EXAM:
RENAL / URINARY TRACT ULTRASOUND COMPLETE

[Series 1: us renal · 0.22mm/px · 14 of 47 slices shown]
[im 1/47]
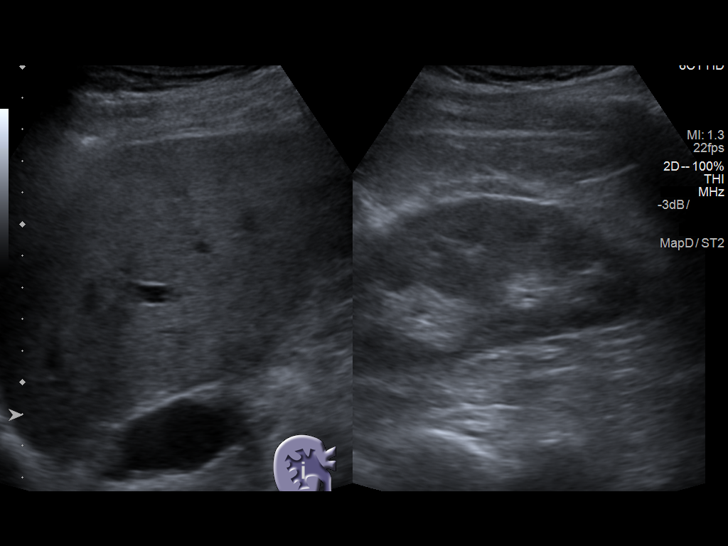
[im 4/47]
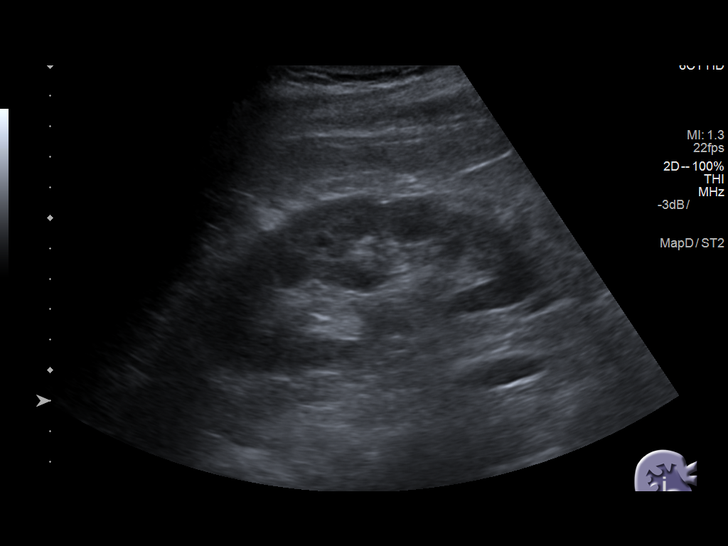
[im 8/47]
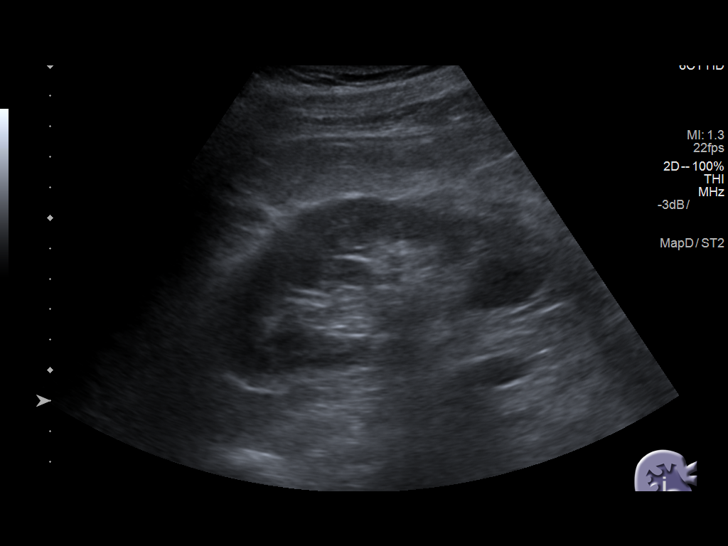
[im 12/47]
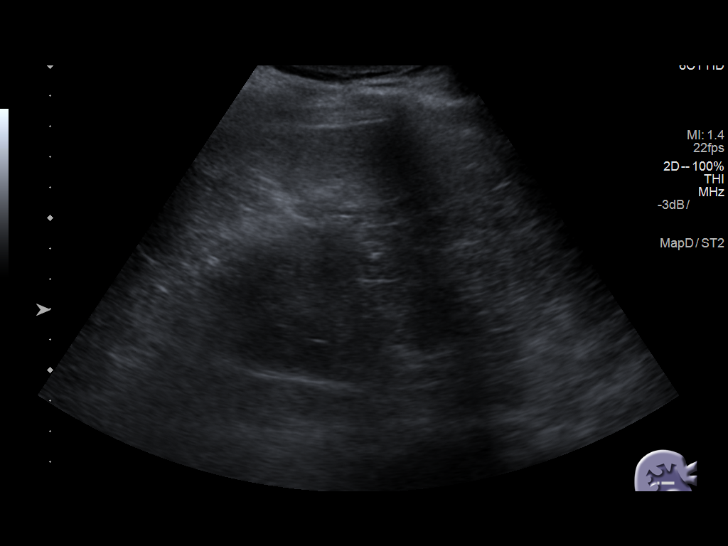
[im 16/47]
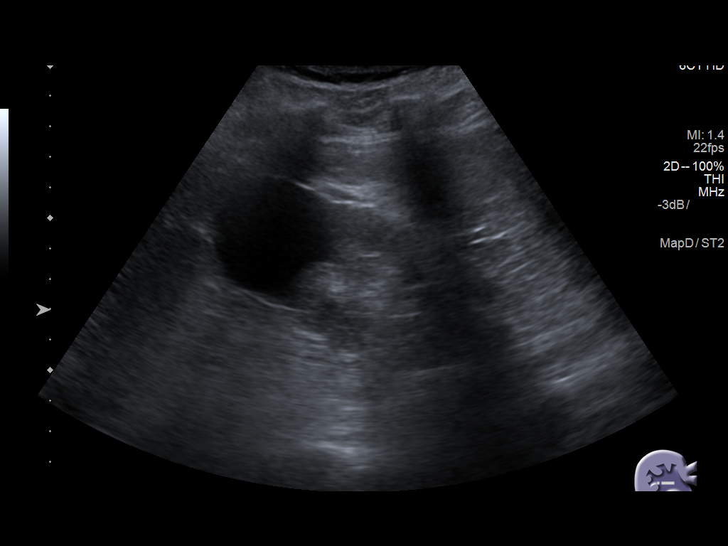
[im 18/47]
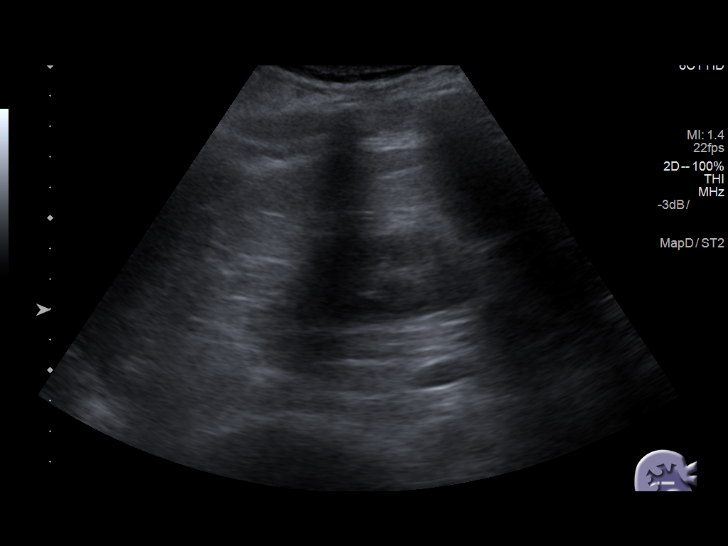
[im 22/47]
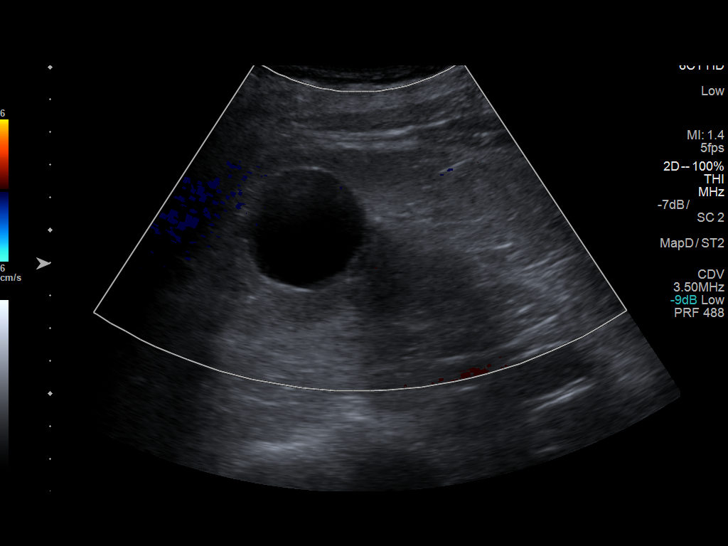
[im 25/47]
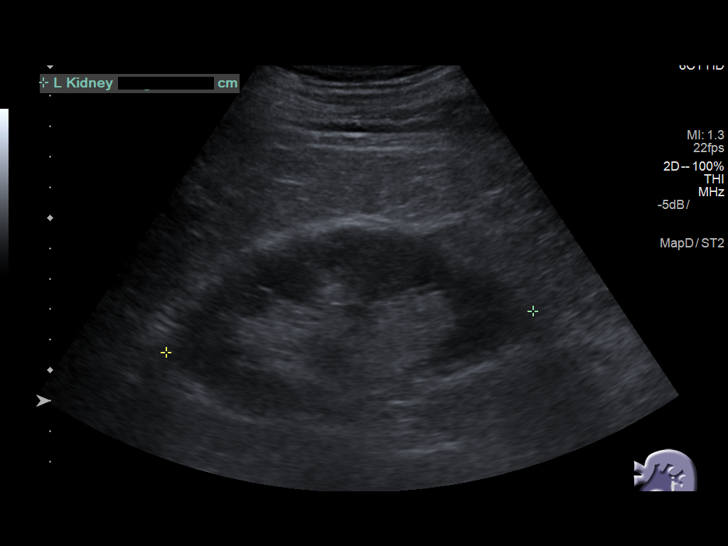
[im 29/47]
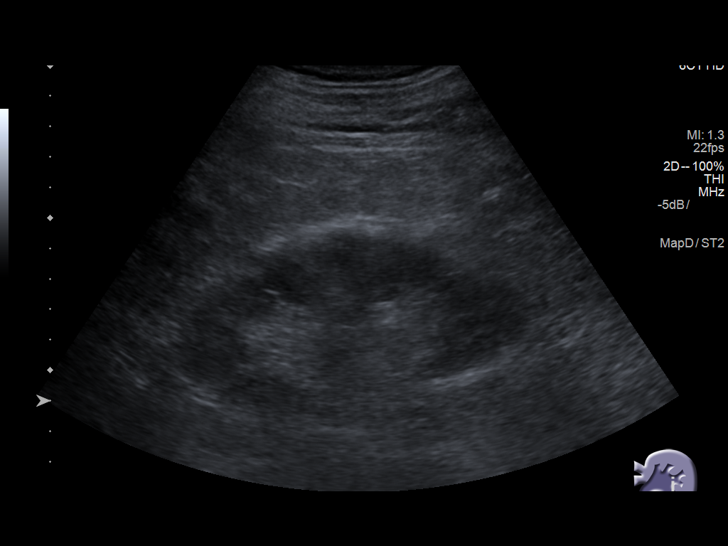
[im 31/47]
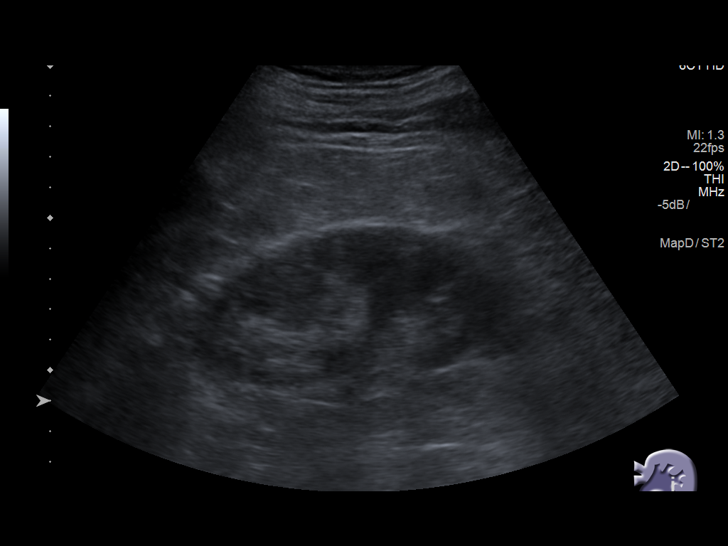
[im 35/47]
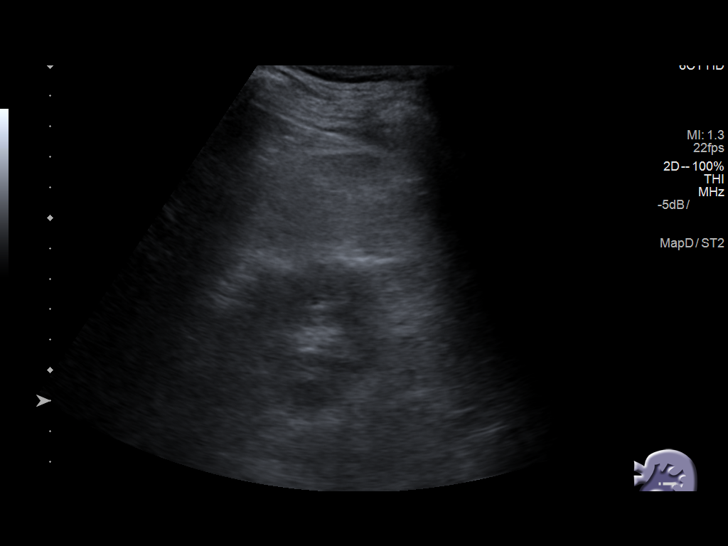
[im 39/47]
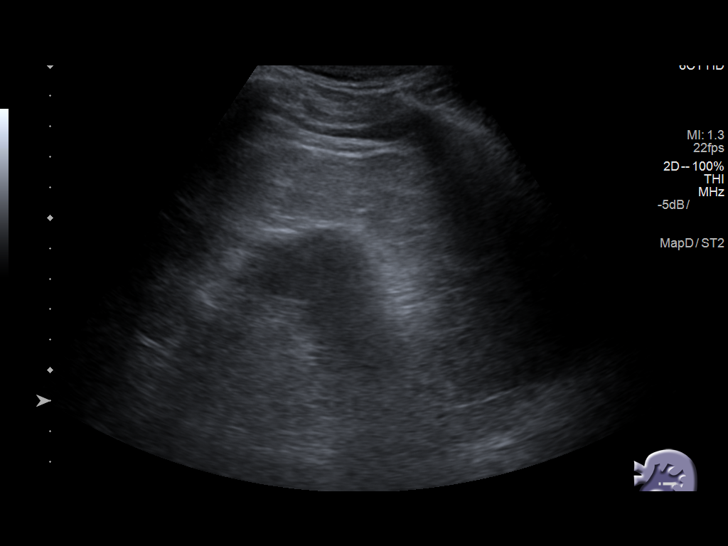
[im 43/47]
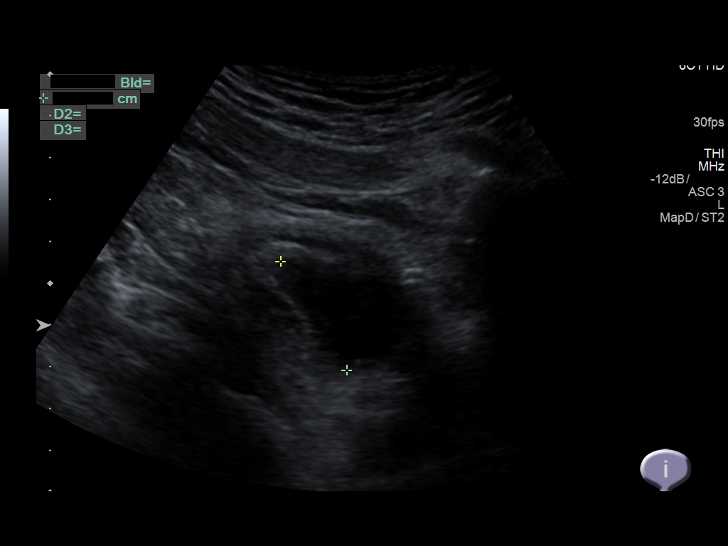
[im 47/47]
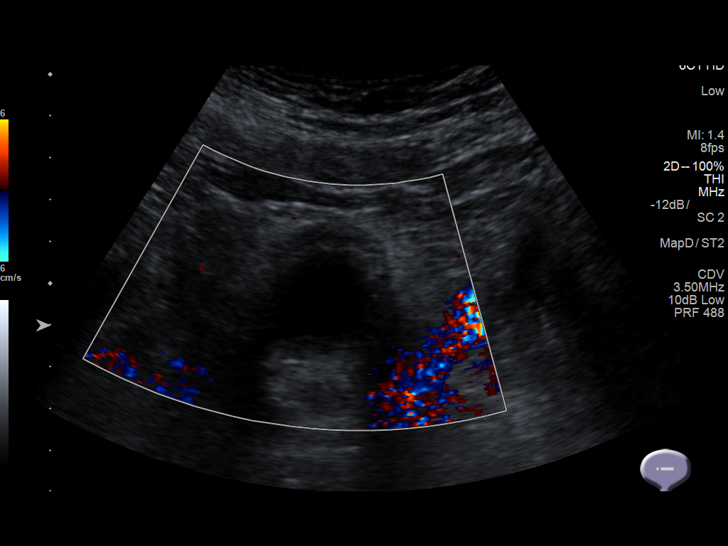

[14 of 25 positions shown; findings below may reference images not displayed]

FINDINGS: Right Kidney:

Length: 12 cm. Echogenicity within normal limits. No solid mass or
hydronephrosis visualized. 4 cm cyst in the interpolar region.

Left Kidney:

Length: 12 cm. Echogenicity within normal limits. No mass or
hydronephrosis visualized.

Bladder:

Limited assessment due to bladder decompression. No detected
abnormality.
IMPRESSION: 1. Negative for hydronephrosis.
2. Simple 4 cm right renal cyst.

## 2019-11-10 ENCOUNTER — Ambulatory Visit
Admission: RE | Admit: 2019-11-10 | Discharge: 2019-11-10 | Disposition: A | Discharge status: Home / Self Care | Payer: Medicare Other | Source: Ambulatory Visit | Attending: Otolaryngology | Admitting: Otolaryngology

## 2019-11-10 ENCOUNTER — Other Ambulatory Visit (HOSPITAL_COMMUNITY)
Admission: RE | Admit: 2019-11-10 | Discharge: 2019-11-10 | Disposition: A | Discharge status: Home / Self Care | Payer: Medicare Other | Source: Ambulatory Visit | Attending: Physician Assistant | Admitting: Physician Assistant

## 2019-11-10 DIAGNOSIS — E041 Nontoxic single thyroid nodule: Secondary | ICD-10-CM | POA: Insufficient documentation

## 2019-11-11 LAB — CYTOLOGY - NON PAP

## 2019-11-17 DIAGNOSIS — E041 Nontoxic single thyroid nodule: Secondary | ICD-10-CM | POA: Diagnosis not present

## 2019-11-22 DIAGNOSIS — I4891 Unspecified atrial fibrillation: Secondary | ICD-10-CM | POA: Diagnosis not present

## 2019-11-22 DIAGNOSIS — E7800 Pure hypercholesterolemia, unspecified: Secondary | ICD-10-CM | POA: Diagnosis not present

## 2019-11-25 DIAGNOSIS — I10 Essential (primary) hypertension: Secondary | ICD-10-CM | POA: Diagnosis not present

## 2019-11-25 DIAGNOSIS — E7800 Pure hypercholesterolemia, unspecified: Secondary | ICD-10-CM | POA: Diagnosis not present

## 2019-11-25 DIAGNOSIS — Z299 Encounter for prophylactic measures, unspecified: Secondary | ICD-10-CM | POA: Diagnosis not present

## 2019-11-25 DIAGNOSIS — Z0000 Encounter for general adult medical examination without abnormal findings: Secondary | ICD-10-CM | POA: Diagnosis not present

## 2019-11-25 DIAGNOSIS — Z7189 Other specified counseling: Secondary | ICD-10-CM | POA: Diagnosis not present

## 2019-11-25 DIAGNOSIS — R5383 Other fatigue: Secondary | ICD-10-CM | POA: Diagnosis not present

## 2019-11-25 DIAGNOSIS — Z79899 Other long term (current) drug therapy: Secondary | ICD-10-CM | POA: Diagnosis not present

## 2019-11-25 DIAGNOSIS — Z1211 Encounter for screening for malignant neoplasm of colon: Secondary | ICD-10-CM | POA: Diagnosis not present

## 2019-12-03 ENCOUNTER — Encounter (HOSPITAL_COMMUNITY): Payer: Self-pay

## 2019-12-09 ENCOUNTER — Ambulatory Visit (INDEPENDENT_AMBULATORY_CARE_PROVIDER_SITE_OTHER): Payer: Medicare Other | Admitting: *Deleted

## 2019-12-09 DIAGNOSIS — Z5181 Encounter for therapeutic drug level monitoring: Secondary | ICD-10-CM

## 2019-12-09 DIAGNOSIS — I482 Chronic atrial fibrillation, unspecified: Secondary | ICD-10-CM

## 2019-12-09 LAB — POCT INR: INR: 2.7 (ref 2.0–3.0)

## 2019-12-09 NOTE — Patient Instructions (Signed)
Continue warfarin 1 tablet daily except 1 1/2 tablets on Tuesdays and Saturdays Recheck in 6 weeks 

## 2019-12-23 DIAGNOSIS — E7800 Pure hypercholesterolemia, unspecified: Secondary | ICD-10-CM | POA: Diagnosis not present

## 2019-12-23 DIAGNOSIS — I4891 Unspecified atrial fibrillation: Secondary | ICD-10-CM | POA: Diagnosis not present

## 2020-01-20 ENCOUNTER — Ambulatory Visit (INDEPENDENT_AMBULATORY_CARE_PROVIDER_SITE_OTHER): Payer: Medicare Other | Admitting: *Deleted

## 2020-01-20 DIAGNOSIS — I482 Chronic atrial fibrillation, unspecified: Secondary | ICD-10-CM

## 2020-01-20 DIAGNOSIS — Z5181 Encounter for therapeutic drug level monitoring: Secondary | ICD-10-CM | POA: Diagnosis not present

## 2020-01-20 LAB — POCT INR: INR: 2.2 (ref 2.0–3.0)

## 2020-01-20 NOTE — Patient Instructions (Signed)
Continue warfarin 1 tablet daily except 1 1/2 tablets on Tuesdays and Saturdays Recheck in 6 weeks 

## 2020-01-22 DIAGNOSIS — I4891 Unspecified atrial fibrillation: Secondary | ICD-10-CM | POA: Diagnosis not present

## 2020-01-22 DIAGNOSIS — E7800 Pure hypercholesterolemia, unspecified: Secondary | ICD-10-CM | POA: Diagnosis not present

## 2020-01-31 DIAGNOSIS — R531 Weakness: Secondary | ICD-10-CM | POA: Diagnosis not present

## 2020-01-31 DIAGNOSIS — Z880 Allergy status to penicillin: Secondary | ICD-10-CM | POA: Diagnosis not present

## 2020-01-31 DIAGNOSIS — J320 Chronic maxillary sinusitis: Secondary | ICD-10-CM | POA: Diagnosis not present

## 2020-01-31 DIAGNOSIS — R6889 Other general symptoms and signs: Secondary | ICD-10-CM | POA: Diagnosis not present

## 2020-01-31 DIAGNOSIS — J3489 Other specified disorders of nose and nasal sinuses: Secondary | ICD-10-CM | POA: Diagnosis not present

## 2020-01-31 DIAGNOSIS — I499 Cardiac arrhythmia, unspecified: Secondary | ICD-10-CM | POA: Diagnosis not present

## 2020-01-31 DIAGNOSIS — I10 Essential (primary) hypertension: Secondary | ICD-10-CM | POA: Diagnosis not present

## 2020-01-31 DIAGNOSIS — Z743 Need for continuous supervision: Secondary | ICD-10-CM | POA: Diagnosis not present

## 2020-01-31 DIAGNOSIS — I7090 Unspecified atherosclerosis: Secondary | ICD-10-CM | POA: Diagnosis not present

## 2020-01-31 DIAGNOSIS — T675XXA Heat exhaustion, unspecified, initial encounter: Secondary | ICD-10-CM | POA: Diagnosis not present

## 2020-01-31 DIAGNOSIS — I6782 Cerebral ischemia: Secondary | ICD-10-CM | POA: Diagnosis not present

## 2020-01-31 DIAGNOSIS — R0689 Other abnormalities of breathing: Secondary | ICD-10-CM | POA: Diagnosis not present

## 2020-01-31 DIAGNOSIS — R0602 Shortness of breath: Secondary | ICD-10-CM | POA: Diagnosis not present

## 2020-01-31 DIAGNOSIS — I6389 Other cerebral infarction: Secondary | ICD-10-CM | POA: Diagnosis not present

## 2020-01-31 DIAGNOSIS — Z882 Allergy status to sulfonamides status: Secondary | ICD-10-CM | POA: Diagnosis not present

## 2020-01-31 DIAGNOSIS — I4891 Unspecified atrial fibrillation: Secondary | ICD-10-CM | POA: Diagnosis not present

## 2020-01-31 DIAGNOSIS — R9431 Abnormal electrocardiogram [ECG] [EKG]: Secondary | ICD-10-CM | POA: Diagnosis not present

## 2020-01-31 DIAGNOSIS — Z79899 Other long term (current) drug therapy: Secondary | ICD-10-CM | POA: Diagnosis not present

## 2020-02-03 ENCOUNTER — Other Ambulatory Visit: Payer: Self-pay | Admitting: *Deleted

## 2020-02-03 MED ORDER — WARFARIN SODIUM 5 MG PO TABS: ORAL_TABLET | ORAL | 4 refills | Status: DC

## 2020-02-09 DIAGNOSIS — I4891 Unspecified atrial fibrillation: Secondary | ICD-10-CM | POA: Diagnosis not present

## 2020-02-09 DIAGNOSIS — E7800 Pure hypercholesterolemia, unspecified: Secondary | ICD-10-CM | POA: Diagnosis not present

## 2020-03-02 ENCOUNTER — Other Ambulatory Visit: Payer: Self-pay

## 2020-03-02 ENCOUNTER — Ambulatory Visit (INDEPENDENT_AMBULATORY_CARE_PROVIDER_SITE_OTHER): Payer: Medicare Other | Admitting: Pharmacist

## 2020-03-02 DIAGNOSIS — I4892 Unspecified atrial flutter: Secondary | ICD-10-CM | POA: Diagnosis not present

## 2020-03-02 DIAGNOSIS — Z5181 Encounter for therapeutic drug level monitoring: Secondary | ICD-10-CM

## 2020-03-02 DIAGNOSIS — I482 Chronic atrial fibrillation, unspecified: Secondary | ICD-10-CM

## 2020-03-02 LAB — POCT INR: INR: 2.2 (ref 2.0–3.0)

## 2020-03-02 NOTE — Patient Instructions (Signed)
Description   Continue warfarin 1 tablet daily except 1 1/2 tablets on Tuesdays and Saturdays Recheck in 7 weeks

## 2020-03-24 DIAGNOSIS — E7800 Pure hypercholesterolemia, unspecified: Secondary | ICD-10-CM | POA: Diagnosis not present

## 2020-03-24 DIAGNOSIS — I4891 Unspecified atrial fibrillation: Secondary | ICD-10-CM | POA: Diagnosis not present

## 2020-04-06 DIAGNOSIS — I10 Essential (primary) hypertension: Secondary | ICD-10-CM | POA: Diagnosis not present

## 2020-04-06 DIAGNOSIS — I4891 Unspecified atrial fibrillation: Secondary | ICD-10-CM | POA: Diagnosis not present

## 2020-04-06 DIAGNOSIS — M545 Low back pain, unspecified: Secondary | ICD-10-CM | POA: Diagnosis not present

## 2020-04-06 DIAGNOSIS — I739 Peripheral vascular disease, unspecified: Secondary | ICD-10-CM | POA: Diagnosis not present

## 2020-04-06 DIAGNOSIS — Z299 Encounter for prophylactic measures, unspecified: Secondary | ICD-10-CM | POA: Diagnosis not present

## 2020-04-22 DIAGNOSIS — I4891 Unspecified atrial fibrillation: Secondary | ICD-10-CM | POA: Diagnosis not present

## 2020-04-22 DIAGNOSIS — E7800 Pure hypercholesterolemia, unspecified: Secondary | ICD-10-CM | POA: Diagnosis not present

## 2020-04-27 ENCOUNTER — Ambulatory Visit (INDEPENDENT_AMBULATORY_CARE_PROVIDER_SITE_OTHER): Payer: Medicare Other | Admitting: *Deleted

## 2020-04-27 DIAGNOSIS — Z5181 Encounter for therapeutic drug level monitoring: Secondary | ICD-10-CM

## 2020-04-27 DIAGNOSIS — I482 Chronic atrial fibrillation, unspecified: Secondary | ICD-10-CM | POA: Diagnosis not present

## 2020-04-27 LAB — POCT INR: INR: 2.6 (ref 2.0–3.0)

## 2020-04-27 NOTE — Patient Instructions (Signed)
Continue warfarin 1 tablet daily except 1 1/2 tablets on Tuesdays and Saturdays Recheck in 6 weeks 

## 2020-05-24 DIAGNOSIS — E7800 Pure hypercholesterolemia, unspecified: Secondary | ICD-10-CM | POA: Diagnosis not present

## 2020-05-24 DIAGNOSIS — I4891 Unspecified atrial fibrillation: Secondary | ICD-10-CM | POA: Diagnosis not present

## 2020-06-08 ENCOUNTER — Ambulatory Visit (INDEPENDENT_AMBULATORY_CARE_PROVIDER_SITE_OTHER): Payer: Medicare Other | Admitting: *Deleted

## 2020-06-08 DIAGNOSIS — I482 Chronic atrial fibrillation, unspecified: Secondary | ICD-10-CM

## 2020-06-08 DIAGNOSIS — Z5181 Encounter for therapeutic drug level monitoring: Secondary | ICD-10-CM

## 2020-06-08 LAB — POCT INR: INR: 2.7 (ref 2.0–3.0)

## 2020-06-08 NOTE — Patient Instructions (Signed)
Continue warfarin 1 tablet daily except 1 1/2 tablets on Tuesdays and Saturdays Recheck in 6 weeks 

## 2020-06-23 DIAGNOSIS — E7800 Pure hypercholesterolemia, unspecified: Secondary | ICD-10-CM | POA: Diagnosis not present

## 2020-06-23 DIAGNOSIS — I4891 Unspecified atrial fibrillation: Secondary | ICD-10-CM | POA: Diagnosis not present

## 2020-06-28 ENCOUNTER — Other Ambulatory Visit: Payer: Self-pay | Admitting: Cardiology

## 2020-07-19 ENCOUNTER — Other Ambulatory Visit: Payer: Self-pay

## 2020-07-19 ENCOUNTER — Ambulatory Visit (INDEPENDENT_AMBULATORY_CARE_PROVIDER_SITE_OTHER): Payer: Medicare Other | Admitting: *Deleted

## 2020-07-19 DIAGNOSIS — Z5181 Encounter for therapeutic drug level monitoring: Secondary | ICD-10-CM

## 2020-07-19 DIAGNOSIS — I482 Chronic atrial fibrillation, unspecified: Secondary | ICD-10-CM

## 2020-07-19 LAB — POCT INR: INR: 3.2 — AB (ref 2.0–3.0)

## 2020-07-19 NOTE — Patient Instructions (Signed)
Take warfarin 1/2 tablet tonight then resume 1 tablet daily except 1 1/2 tablets on Tuesdays and Saturdays Recheck in 4 weeks

## 2020-08-15 ENCOUNTER — Other Ambulatory Visit: Payer: Self-pay | Admitting: Cardiology

## 2020-08-16 ENCOUNTER — Ambulatory Visit (INDEPENDENT_AMBULATORY_CARE_PROVIDER_SITE_OTHER): Payer: Medicare Other | Admitting: *Deleted

## 2020-08-16 DIAGNOSIS — I482 Chronic atrial fibrillation, unspecified: Secondary | ICD-10-CM

## 2020-08-16 DIAGNOSIS — Z5181 Encounter for therapeutic drug level monitoring: Secondary | ICD-10-CM | POA: Diagnosis not present

## 2020-08-16 LAB — POCT INR: INR: 2.3 (ref 2.0–3.0)

## 2020-08-16 NOTE — Patient Instructions (Signed)
Continue warfarin 1 tablet daily except 1 1/2 tablets on Tuesdays and Saturdays Recheck in 5 weeks

## 2020-09-07 ENCOUNTER — Encounter: Payer: Self-pay | Admitting: *Deleted

## 2020-09-07 DIAGNOSIS — I4821 Permanent atrial fibrillation: Secondary | ICD-10-CM | POA: Insufficient documentation

## 2020-09-07 NOTE — Progress Notes (Unsigned)
Cardiology Office Note   Date:  09/08/2020   ID:  [name], DOB 1933-10-25, MRN [id number]  PCP:  Glenda Chroman, MD  Cardiologist:   Minus Breeding, MD   Chief Complaint  Patient presents with  . Fatigue      History of Present Illness: [name] is a 87 y.o. male who presents for follow-up of permanent atrial fibrillation.  He has ascending aorta measuring 44 mm.  Since he was last seen he has had continued weakness.  This was apparently a complaint before.  He had fatigue.  This has been slowly progressive.  He had some low blood pressures and had his beta-blocker reduced but apparently this did not help.  He has occasional dizziness and wooziness but he does not have presyncope or syncope.  He does not really feel his atrial fibrillation.  This has been chronic.  He does not have any chest pressure, neck or arm discomfort.  He has no weight gain or edema.  He does not sleep well.    Past Medical History:  Diagnosis Date  . Atrial flutter, paroxysmal ([place])    a. Hx of TEE with LAA clots. b. s/p ESP/RFCA of atrial flutter 08/2008. b. Recurrence of A-flutter after ablation/atypical, requiring DCCV and amiodaorone.  [name] BPH (benign prostatic hypertrophy)   . Cardiomyopathy ([place])    a. Prior EF 30-35%, felt tachycardia induced cardiomyopathyy. b. Improved after cardioversion and restoration of normal sinus rhythm - EF 65-70% by echo 10/13  . Essential hypertension, benign   . H/O amiodarone therapy    Used for atrial flutter after return of flutter after ablation  . Shortness of breath for last year   when pt lies down   . Urination, excessive at night   . Warfarin anticoagulation    For atrial flutter    Past Surgical History:  Procedure Laterality Date  . CYSTOSCOPY/RETROGRADE/URETEROSCOPY Right 08/26/2017   Procedure: CYSTOSCOPY/ RIGHT RETROGRADE PYELOGRAM/ RIGHT URETEROSCOPY WITH RIGHT URETERAL STENT PLACEMENT;  Surgeon: Cleon Gustin, MD;  Location: [facility];  Service: Urology;  Laterality: Right;  . FEMORAL HERNIA REPAIR     x2  . TOTAL KNEE ARTHROPLASTY Right 2004  . TRANSURETHRAL PROSTATECTOMY WITH GYRUS INSTRUMENTS N/A 03/12/2013   Procedure: TRANSURETHRAL PROSTATECTOMY WITH GYRUS INSTRUMENTS;  Surgeon: Ailene Rud, MD;  Location: [facility];  Service: Urology;  Laterality: N/A;  . TURP VAPORIZATION    . UMBILICAL HERNIA REPAIR       Current Outpatient Medications  Medication Sig Dispense Refill  . alfuzosin (UROXATRAL) 10 MG 24 hr tablet Take 10 mg by mouth at bedtime.     [name] atorvastatin (LIPITOR) 10 MG tablet Take 10 mg by mouth daily.     . hydrocortisone cream 1 % Apply 1 application topically daily as needed for itching.    . latanoprost (XALATAN) 0.005 % ophthalmic solution Place 1 drop into both eyes once a week.     . metoprolol succinate (TOPROL-XL) 25 MG 24 hr tablet TAKE 1/2 TABLET BY MOUTH DAILY 30 tablet 3  . Multiple Vitamins-Minerals (ICAPS AREDS 2) CAPS Take by mouth.    . Omega-3 Fatty Acids (FISH OIL) 1000 MG CAPS Take 1,000 mg by mouth daily.     [name] warfarin (COUMADIN) 5 MG tablet TAKE ONE TABLET BY MOUTH DAILY EXCEPT TAKE 1 AND 1/2 TABLETS ON TUESDAY AND SATURDAY. 45 tablet 4   No current facility-administered medications for this visit.    Allergies:  Penicillins and Sulfonamide derivatives   ROS:  Please see the history of present illness.   Otherwise, review of systems are positive for vertigo.   All other systems are reviewed and negative.    PHYSICAL EXAM: VS:  BP 118/68 (BP Location: Right Arm, Patient Position: Sitting, Cuff Size: Normal)   Pulse 89   SpO2 96%  , BMI There is no height or weight on file to calculate BMI.  GENERAL:  Well appearing NECK:  No jugular venous distention, waveform within normal limits, carotid upstroke brisk and symmetric, no bruits, no thyromegaly LUNGS:  Clear to auscultation bilaterally CHEST:  Unremarkable HEART:  PMI not displaced or sustained,S1 and S2 within  normal limits, no S3, no clicks, no rubs, no murmurs, irregular ABD:  Flat, positive bowel sounds normal in frequency in pitch, no bruits, no rebound, no guarding, no midline pulsatile mass, no hepatomegaly, no splenomegaly EXT:  2 plus pulses throughout, no edema, no cyanosis no clubbing  EKG:  EKG is not ordered today. The ekg ordered 01/31/2020 demonstrates atrial fibrillation, rate 91, leftward axis, no acute ST-T wave changes.   Recent Labs: No results found for requested labs within last 8760 hours.    Lipid Panel No results found for: CHOL, TRIG, HDL, CHOLHDL, VLDL, LDLCALC, LDLDIRECT    Wt Readings from Last 3 Encounters:  08/27/19 206 lb (93.4 kg)  03/04/19 210 lb (95.3 kg)  11/28/18 215 lb (97.5 kg)      Other studies Reviewed: Additional studies/ records that were reviewed today include: None. Review of the above records demonstrates:  NA   ASSESSMENT AND PLAN:  Permanent atrial fibrillation:    He tolerates anticoagulation.  He could not afford DOAC.  He has had reasonable rate control.  No change in therapy.  I will be checking a CBC today.   History of cardiomyopathy:   His EF was 59% by stress testing in 2020.  There was also normal on echo.  I have no reason to suspect that it is lower.  Dyspnea on exertion/fatigue:    He does not sleep well which I think is a primary issue.  We talked about this and I suggested he try melatonin.  In addition I will check TSH.  He also has not had blood work done recently so I will check routine basic metabolic profile as well as as the CBC above.  However, I do not feel strongly that there is a cardiac etiology.    Current medicines are reviewed at length with the patient today.  The patient does not have concerns regarding medicines.  The following changes have been made:  no change  Labs/ tests ordered today include:   Orders Placed This Encounter  Procedures  . CBC  . Basic Metabolic Panel (BMET)  . TSH      Disposition:   FU with me or [place] MD in one year.     Signed, Minus Breeding, MD  09/08/2020 5:11 PM    [place]

## 2020-09-08 ENCOUNTER — Other Ambulatory Visit: Payer: Self-pay

## 2020-09-08 ENCOUNTER — Ambulatory Visit: Payer: Medicare Other | Admitting: Cardiology

## 2020-09-08 ENCOUNTER — Encounter: Payer: Self-pay | Admitting: Cardiology

## 2020-09-08 VITALS — BP 118/68 | HR 89

## 2020-09-08 DIAGNOSIS — R0602 Shortness of breath: Secondary | ICD-10-CM

## 2020-09-08 DIAGNOSIS — I4821 Permanent atrial fibrillation: Secondary | ICD-10-CM

## 2020-09-08 NOTE — Patient Instructions (Signed)
Your physician wants you to follow-up in: [place] will receive a reminder letter in the mail two months in advance. If you don't receive a letter, please call our office to schedule the follow-up appointment.  Your physician recommends that you continue on your current medications as directed. Please refer to the Current Medication list given to you today.  YOU CAN TAKE OVER THE COUNTER MELATONIN 3 MG FOR SLEEP  Your physician recommends that you return for lab work CBC/BMP/TSH  Thank you for choosing [facility]!!

## 2020-09-12 ENCOUNTER — Other Ambulatory Visit: Payer: Self-pay

## 2020-09-12 ENCOUNTER — Other Ambulatory Visit (HOSPITAL_COMMUNITY)
Admission: RE | Admit: 2020-09-12 | Discharge: 2020-09-12 | Disposition: A | Discharge status: Home / Self Care | Payer: Medicare Other | Source: Ambulatory Visit | Attending: Cardiology | Admitting: Cardiology

## 2020-09-12 DIAGNOSIS — I4821 Permanent atrial fibrillation: Secondary | ICD-10-CM | POA: Insufficient documentation

## 2020-09-12 LAB — BASIC METABOLIC PANEL
Anion gap: 9 (ref 5–15)
BUN: 36 mg/dL — ABNORMAL HIGH (ref 8–23)
CO2: 24 mmol/L (ref 22–32)
Calcium: 8.9 mg/dL (ref 8.9–10.3)
Chloride: 103 mmol/L (ref 98–111)
Creatinine, Ser: 1.17 mg/dL (ref 0.61–1.24)
GFR, Estimated: >60 mL/min (ref >60)
Glucose, Bld: 123 mg/dL — ABNORMAL HIGH (ref 70–99)
Potassium: 4.1 mmol/L (ref 3.5–5.1)
Sodium: 136 mmol/L (ref 135–145)

## 2020-09-12 LAB — CBC
HCT: 47.1 % (ref 39.0–52.0)
Hemoglobin: 15.1 g/dL (ref 13.0–17.0)
MCH: 31.5 pg (ref 26.0–34.0)
MCHC: 32.1 g/dL (ref 30.0–36.0)
MCV: 98.3 fL (ref 80.0–100.0)
Platelets: 148 10*3/uL — ABNORMAL LOW (ref 150–400)
RBC: 4.79 MIL/uL (ref 4.22–5.81)
RDW: 13.4 % (ref 11.5–15.5)
WBC: 8.9 10*3/uL (ref 4.0–10.5)
nRBC: 0 % (ref 0.0–0.2)

## 2020-09-12 LAB — TSH: TSH: 2.112 u[IU]/mL (ref 0.350–4.500)

## 2020-09-20 ENCOUNTER — Ambulatory Visit (INDEPENDENT_AMBULATORY_CARE_PROVIDER_SITE_OTHER): Payer: Medicare Other | Admitting: *Deleted

## 2020-09-20 DIAGNOSIS — Z5181 Encounter for therapeutic drug level monitoring: Secondary | ICD-10-CM | POA: Diagnosis not present

## 2020-09-20 DIAGNOSIS — I482 Chronic atrial fibrillation, unspecified: Secondary | ICD-10-CM | POA: Diagnosis not present

## 2020-09-20 LAB — POCT INR: INR: 3.4 — AB (ref 2.0–3.0)

## 2020-09-20 NOTE — Patient Instructions (Addendum)
Hold warfarin tonight then resume 1 tablet daily except 1 1/2 tablets on Tuesdays and Saturdays Recheck in 3 weeks

## 2020-09-22 DIAGNOSIS — I10 Essential (primary) hypertension: Secondary | ICD-10-CM | POA: Diagnosis not present

## 2020-09-22 DIAGNOSIS — I739 Peripheral vascular disease, unspecified: Secondary | ICD-10-CM | POA: Diagnosis not present

## 2020-09-22 DIAGNOSIS — R42 Dizziness and giddiness: Secondary | ICD-10-CM | POA: Diagnosis not present

## 2020-09-22 DIAGNOSIS — Z299 Encounter for prophylactic measures, unspecified: Secondary | ICD-10-CM | POA: Diagnosis not present

## 2020-09-22 DIAGNOSIS — I4891 Unspecified atrial fibrillation: Secondary | ICD-10-CM | POA: Diagnosis not present

## 2020-09-22 DIAGNOSIS — I712 Thoracic aortic aneurysm, without rupture: Secondary | ICD-10-CM | POA: Diagnosis not present

## 2020-10-11 ENCOUNTER — Ambulatory Visit (INDEPENDENT_AMBULATORY_CARE_PROVIDER_SITE_OTHER): Payer: Medicare Other | Admitting: *Deleted

## 2020-10-11 DIAGNOSIS — Z5181 Encounter for therapeutic drug level monitoring: Secondary | ICD-10-CM

## 2020-10-11 DIAGNOSIS — I482 Chronic atrial fibrillation, unspecified: Secondary | ICD-10-CM | POA: Diagnosis not present

## 2020-10-11 LAB — POCT INR: INR: 2.9 (ref 2.0–3.0)

## 2020-10-11 NOTE — Patient Instructions (Signed)
Continue warfarin 1 tablet daily except 1 1/2 tablets on Tuesdays and Saturdays Recheck in 4 weeks

## 2020-11-03 IMAGING — CT CT ANGIO CHEST
2 of 6 series · 16 of 36 positions shown · IV contrast (omnipaque)
Comparison: Report of prior cardiac echo study December 03, 2018; chest
CT May 31, 2009

CLINICAL DATA: Ascending thoracic aortic prominence seen on prior
echocardiogram

EXAM:
CT ANGIOGRAPHY CHEST WITH CONTRAST
TECHNIQUE: Multidetector CT imaging of the chest was performed using the
standard protocol during bolus administration of intravenous
contrast. Multiplanar CT image reconstructions and MIPs were
obtained to evaluate the vascular anatomy.
CONTRAST:  100mL OMNIPAQUE IOHEXOL 350 MG/ML SOLN

[Series 9: lungs · axial · 0.67mm/px · z∈[+1176,+1456]mm · 15 of 160 slices shown]
[im 10/160  lung]
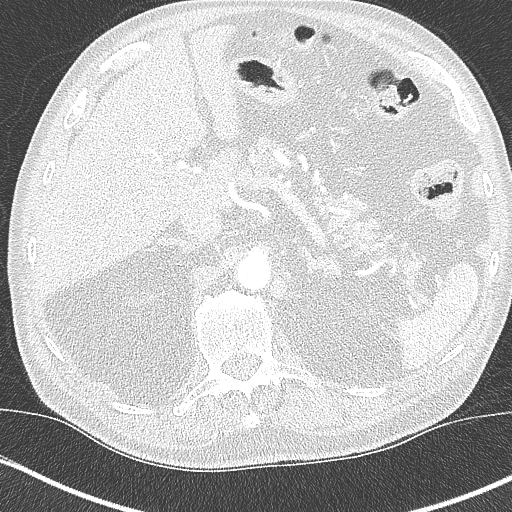
[im 20/160  mediastinal]
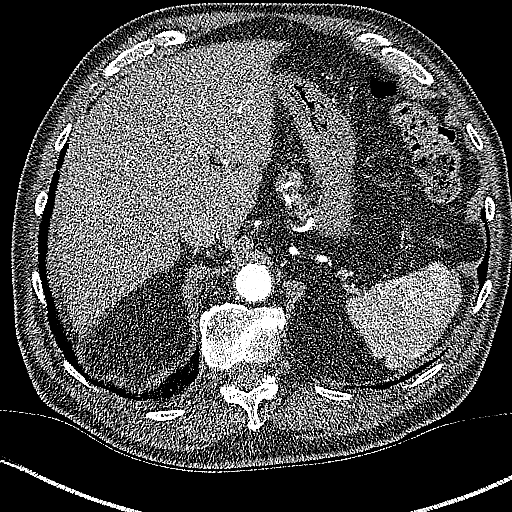
[im 30/160  lung]
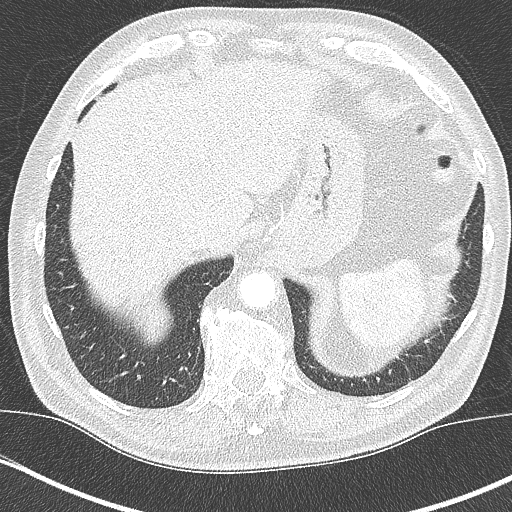
[im 40/160  mediastinal]
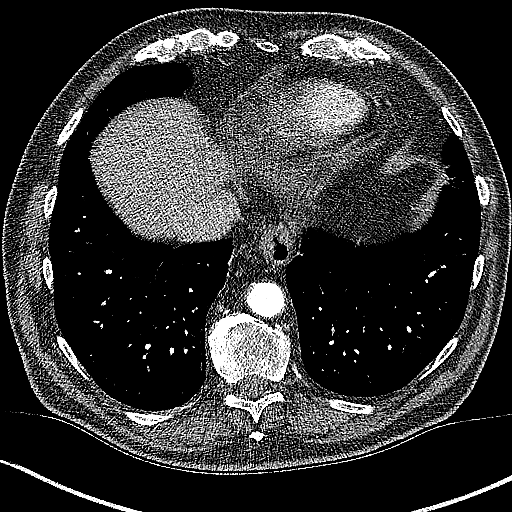
[im 50/160  lung]
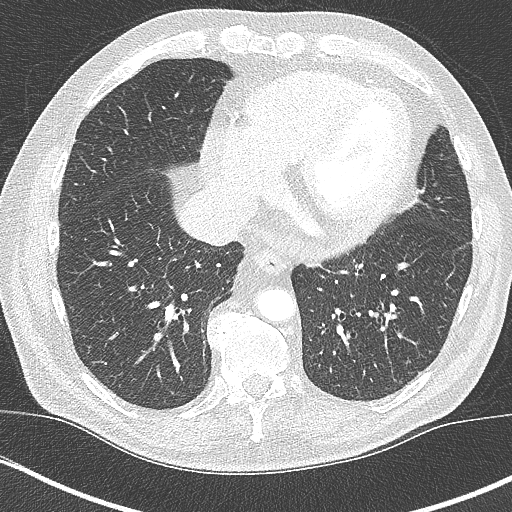
[im 60/160  mediastinal]
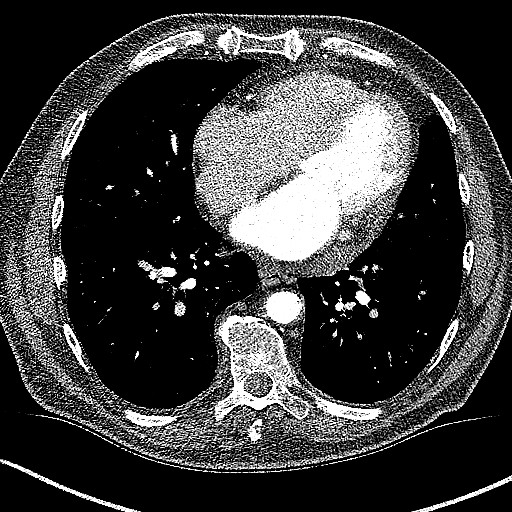
[im 70/160  lung]
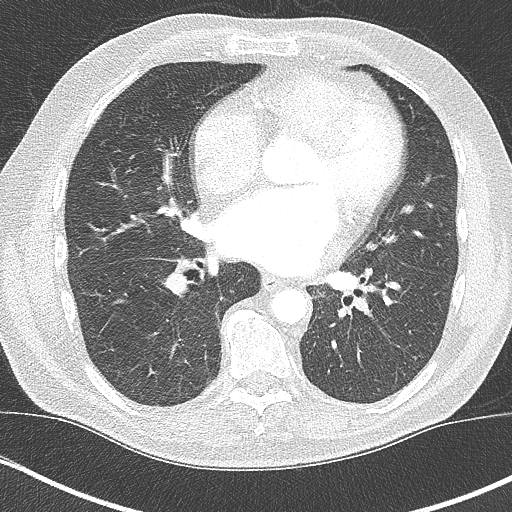
[im 80/160  mediastinal]
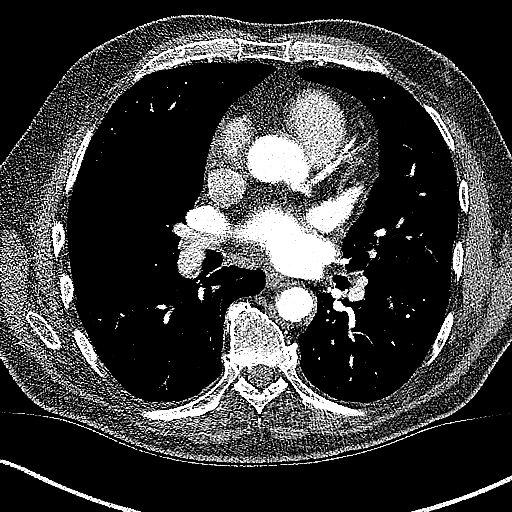
[im 90/160  lung]
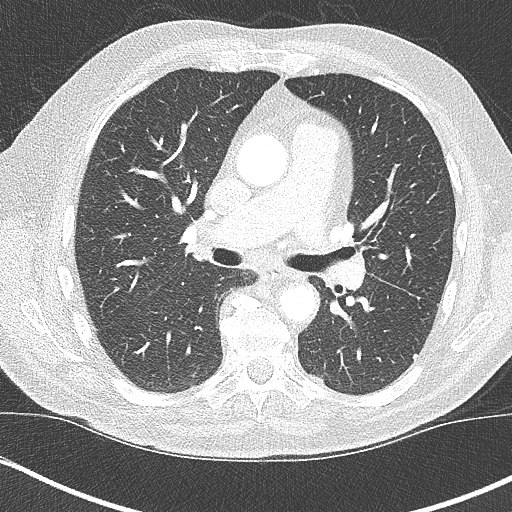
[im 100/160  mediastinal]
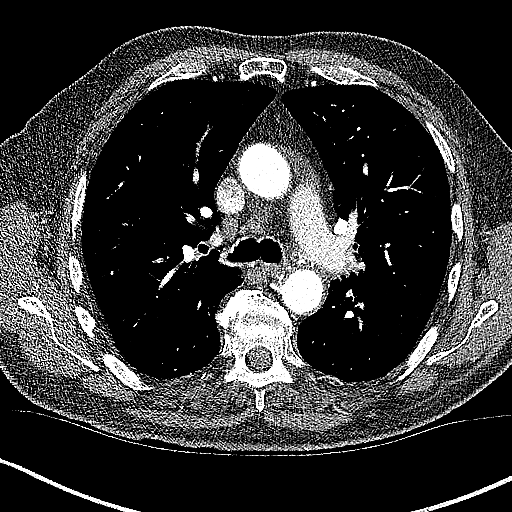
[im 110/160  lung]
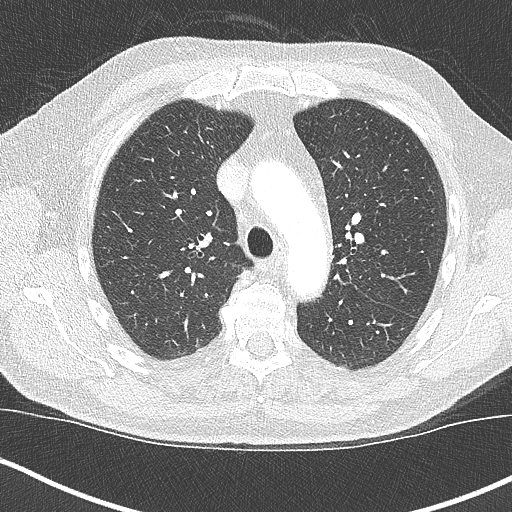
[im 120/160  mediastinal]
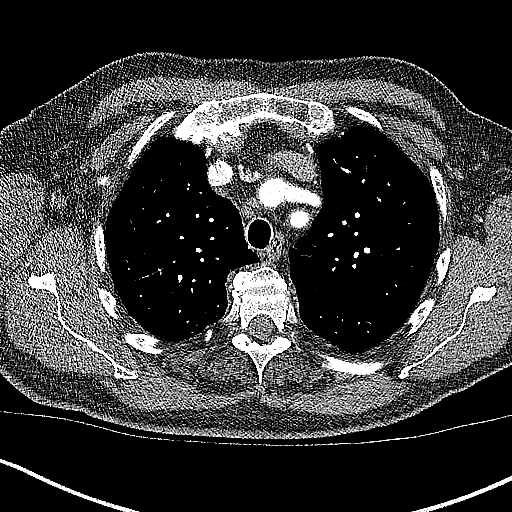
[im 130/160  lung]
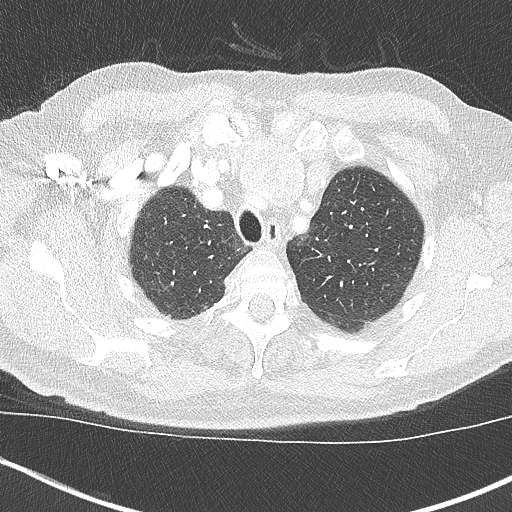
[im 140/160  mediastinal]
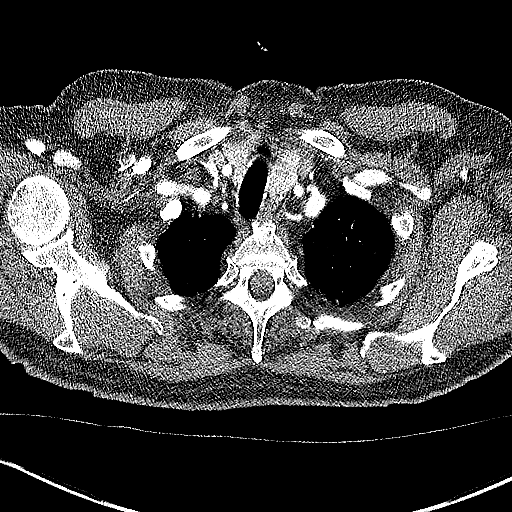
[im 150/160  lung]
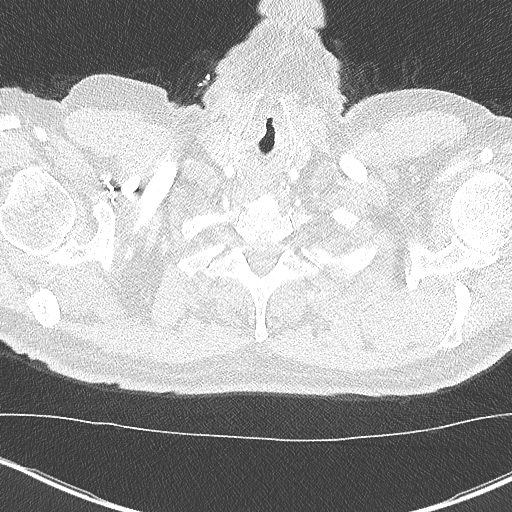

[Series 10: cor soft · coronal · 0.68mm/px · 1 of 164 slices shown]
[im 82/164  mediastinal]
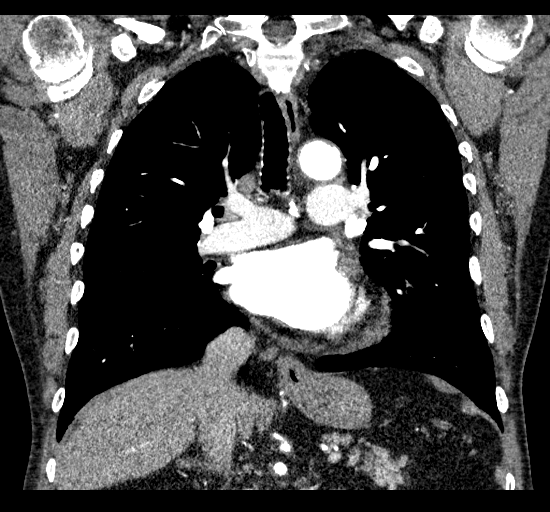

[16 of 36 positions shown; findings below may reference images not displayed]

FINDINGS: Cardiovascular: Ascending thoracic aortic diameter is measured at
3.5 x 3.5 cm. The measured transverse diameter at the sinuses of
Valsalva measures 3.6 cm. There is no demonstrable thoracic aortic
aneurysm or dissection. The visualized great vessels appear
unremarkable. Note that the right innominate and left common carotid
arteries arise as a common trunk, an anatomic variant. There are
scattered foci of aortic atherosclerosis and coronary artery
calcification. A small pericardial effusion is noted. The amount of
pericardial fluid is at the upper physiologic range by subjective
assessment. No pulmonary embolus is demonstrable on this study.

Mediastinum/Nodes: There is a dominant mass arising from the left
lobe of the thyroid measuring 4.0 x 3.9 cm.

There is no appreciable thoracic adenopathy. There are scattered
calcifications within lymph nodes, likely due to prior granulomatous
disease. There is a small hiatal hernia.

Lungs/Pleura: There is a stable calcified granuloma in the inferior
lingula. There are several 2-4 mm nodular opacities in the medial
segment of the right middle lobe, essentially stable. There is a
focal nodular opacity in the superior segment of the right lower
lobe measuring 9 mm. This opacity is best seen on axial slice 75
series 9. There is a nodular opacity in the lateral segment of the
right lower lobe measuring 4 mm, seen on axial slice 107 series 9.
This. A second nodular opacity measuring 4 mm is seen in the lateral
segment right lower lobe on axial slice 109 series 9.

No edema or consolidation. There are scattered areas of mild
atelectasis. There is slight lower lobe bronchiectatic change
bilaterally.

Upper Abdomen: There is a 1.7 x 1.5 cm mass in the left adrenal.
Visualized upper abdominal structures otherwise appear unremarkable.

Musculoskeletal: There is diffuse idiopathic skeletal hyperostosis
in the thoracic spine. There are no blastic or lytic bone lesions.
No chest wall lesions are evident.

Review of the MIP images confirms the above findings.
IMPRESSION: 1. There is no demonstrable thoracic aortic aneurysm. The maximum
measured transverse diameter in the ascending aorta is 3.5 x 3.5 cm.
There the measured transverse diameter at the sinuses of Valsalva
measures 3.6 cm. No thoracic aortic dissection. There are foci of
aortic atherosclerosis and coronary artery calcification.

2. Dominant left lobe thyroid mass measuring 4.0 x 3.9 cm. This mass
may well warrant tissue sampling given its size.

3. No lung edema or consolidation. Mild lower lobe bronchiectatic
change bilaterally. Several nodular opacities, largest measuring 9
mm. Consider one of the following in 3 months for both low-risk and
high-risk individuals: (a) repeat chest CT, (b) follow-up PET-CT, or
(c) tissue sampling. This recommendation follows the consensus
statement: Guidelines for Management of Incidental Pulmonary Nodules
Detected on CT Images: From the [HOSPITAL] 2860; Radiology
2860; [DATE].

4. There is a 1.7 x 1.5 cm left adrenal mass, indeterminate by
attenuation criteria. Per consensus guidelines, a follow-up CT of
the adrenals in 1 year advised to further evaluate.

5.  No evident thoracic adenopathy.

6.  Small hiatal hernia.

7.  Diffuse idiopathic skeletal hyperostosis in the thoracic spine.

Aortic Atherosclerosis ([id number]).

These results will be called to the ordering clinician or
representative by the Radiologist Assistant, and communication
documented in the PACS or zVision Dashboard.

## 2020-11-08 ENCOUNTER — Ambulatory Visit (INDEPENDENT_AMBULATORY_CARE_PROVIDER_SITE_OTHER): Payer: Medicare Other | Admitting: *Deleted

## 2020-11-08 DIAGNOSIS — I482 Chronic atrial fibrillation, unspecified: Secondary | ICD-10-CM

## 2020-11-08 DIAGNOSIS — Z5181 Encounter for therapeutic drug level monitoring: Secondary | ICD-10-CM

## 2020-11-08 LAB — POCT INR: INR: 3 (ref 2.0–3.0)

## 2020-11-08 NOTE — Patient Instructions (Signed)
Continue warfarin 1 tablet daily except 1 1/2 tablets on Tuesdays and Saturdays Recheck in 4 weeks 

## 2020-11-22 DIAGNOSIS — M545 Low back pain, unspecified: Secondary | ICD-10-CM | POA: Diagnosis not present

## 2020-11-22 DIAGNOSIS — R42 Dizziness and giddiness: Secondary | ICD-10-CM | POA: Diagnosis not present

## 2020-11-22 DIAGNOSIS — D692 Other nonthrombocytopenic purpura: Secondary | ICD-10-CM | POA: Diagnosis not present

## 2020-11-22 DIAGNOSIS — I272 Pulmonary hypertension, unspecified: Secondary | ICD-10-CM | POA: Diagnosis not present

## 2020-11-22 DIAGNOSIS — Z299 Encounter for prophylactic measures, unspecified: Secondary | ICD-10-CM | POA: Diagnosis not present

## 2020-11-30 DIAGNOSIS — I10 Essential (primary) hypertension: Secondary | ICD-10-CM | POA: Diagnosis not present

## 2020-11-30 DIAGNOSIS — R5383 Other fatigue: Secondary | ICD-10-CM | POA: Diagnosis not present

## 2020-11-30 DIAGNOSIS — Z299 Encounter for prophylactic measures, unspecified: Secondary | ICD-10-CM | POA: Diagnosis not present

## 2020-11-30 DIAGNOSIS — Z7189 Other specified counseling: Secondary | ICD-10-CM | POA: Diagnosis not present

## 2020-11-30 DIAGNOSIS — Z0000 Encounter for general adult medical examination without abnormal findings: Secondary | ICD-10-CM | POA: Diagnosis not present

## 2020-11-30 DIAGNOSIS — E7800 Pure hypercholesterolemia, unspecified: Secondary | ICD-10-CM | POA: Diagnosis not present

## 2020-11-30 DIAGNOSIS — Z79899 Other long term (current) drug therapy: Secondary | ICD-10-CM | POA: Diagnosis not present

## 2020-12-07 DIAGNOSIS — R413 Other amnesia: Secondary | ICD-10-CM | POA: Diagnosis not present

## 2020-12-14 DIAGNOSIS — I700 Atherosclerosis of aorta: Secondary | ICD-10-CM | POA: Diagnosis not present

## 2020-12-14 DIAGNOSIS — I10 Essential (primary) hypertension: Secondary | ICD-10-CM | POA: Diagnosis not present

## 2020-12-14 DIAGNOSIS — R413 Other amnesia: Secondary | ICD-10-CM | POA: Diagnosis not present

## 2020-12-14 DIAGNOSIS — H409 Unspecified glaucoma: Secondary | ICD-10-CM | POA: Diagnosis not present

## 2020-12-14 DIAGNOSIS — Z299 Encounter for prophylactic measures, unspecified: Secondary | ICD-10-CM | POA: Diagnosis not present

## 2020-12-19 ENCOUNTER — Ambulatory Visit (INDEPENDENT_AMBULATORY_CARE_PROVIDER_SITE_OTHER): Payer: Medicare Other | Admitting: *Deleted

## 2020-12-19 ENCOUNTER — Other Ambulatory Visit: Payer: Self-pay

## 2020-12-19 DIAGNOSIS — I482 Chronic atrial fibrillation, unspecified: Secondary | ICD-10-CM | POA: Diagnosis not present

## 2020-12-19 DIAGNOSIS — Z5181 Encounter for therapeutic drug level monitoring: Secondary | ICD-10-CM | POA: Diagnosis not present

## 2020-12-19 LAB — POCT INR: INR: 2.7 (ref 2.0–3.0)

## 2020-12-19 NOTE — Patient Instructions (Signed)
Continue warfarin 1 tablet daily except 1 1/2 tablets on Tuesdays and Saturdays Recheck in 6 weeks 

## 2020-12-22 DIAGNOSIS — M25562 Pain in left knee: Secondary | ICD-10-CM | POA: Diagnosis not present

## 2020-12-22 DIAGNOSIS — I10 Essential (primary) hypertension: Secondary | ICD-10-CM | POA: Diagnosis not present

## 2020-12-22 DIAGNOSIS — Z299 Encounter for prophylactic measures, unspecified: Secondary | ICD-10-CM | POA: Diagnosis not present

## 2021-01-06 ENCOUNTER — Encounter (HOSPITAL_COMMUNITY): Payer: Self-pay | Admitting: Physical Therapy

## 2021-01-06 ENCOUNTER — Other Ambulatory Visit: Payer: Self-pay

## 2021-01-06 ENCOUNTER — Ambulatory Visit (HOSPITAL_COMMUNITY): Payer: Medicare Other | Attending: Student | Admitting: Physical Therapy

## 2021-01-06 DIAGNOSIS — R2689 Other abnormalities of gait and mobility: Secondary | ICD-10-CM | POA: Diagnosis not present

## 2021-01-06 DIAGNOSIS — R42 Dizziness and giddiness: Secondary | ICD-10-CM

## 2021-01-07 NOTE — Therapy (Signed)
[facility], [place], [address] Phone: [phone]   Fax:  [phone]  Physical Therapy Evaluation  Patient Details  Name: [name] MRN: [id number] Date of Birth: 17-Jan-1934 Referring Provider (PT): Tamela Gammon   Encounter Date: 01/06/2021   PT End of Session - 01/06/21 1241     Visit Number 1    Number of Visits 12    Date for PT Re-Evaluation 02/18/21    Authorization - Number of Visits 1    Progress Note Due on Visit 10    PT Start Time 7035    PT Stop Time 1705    PT Time Calculation (min) 50 min    Equipment Utilized During Treatment Gait belt    Activity Tolerance Patient tolerated treatment well    Behavior During Therapy WFL for tasks assessed/performed             Past Medical History:  Diagnosis Date   Atrial flutter, paroxysmal ([place])    a. Hx of TEE with LAA clots. b. s/p ESP/RFCA of atrial flutter 08/2008. b. Recurrence of A-flutter after ablation/atypical, requiring DCCV and amiodaorone.   BPH (benign prostatic hypertrophy)    Cardiomyopathy ([place])    a. Prior EF 30-35%, felt tachycardia induced cardiomyopathyy. b. Improved after cardioversion and restoration of normal sinus rhythm - EF 65-70% by echo 10/13   Essential hypertension, benign    H/O amiodarone therapy    Used for atrial flutter after return of flutter after ablation   Shortness of breath for last year   when pt lies down    Urination, excessive at night    Warfarin anticoagulation    For atrial flutter    Past Surgical History:  Procedure Laterality Date   CYSTOSCOPY/RETROGRADE/URETEROSCOPY Right 08/26/2017   Procedure: CYSTOSCOPY/ RIGHT RETROGRADE PYELOGRAM/ RIGHT URETEROSCOPY WITH RIGHT URETERAL STENT PLACEMENT;  Surgeon: Cleon Gustin, MD;  Location: [facility];  Service: Urology;  Laterality: Right;   FEMORAL HERNIA REPAIR     x2   TOTAL KNEE ARTHROPLASTY Right 2004   TRANSURETHRAL PROSTATECTOMY WITH  GYRUS INSTRUMENTS N/A 03/12/2013   Procedure: TRANSURETHRAL PROSTATECTOMY WITH GYRUS INSTRUMENTS;  Surgeon: Ailene Rud, MD;  Location: [facility];  Service: Urology;  Laterality: N/A;   TURP VAPORIZATION     UMBILICAL HERNIA REPAIR      There were no vitals filed for this visit.        01/06/21 0001  Assessment  Medical Diagnosis Dizziness  Referring Provider (PT) Tamela Gammon  Precautions  Precautions Fall  Restrictions  Weight Bearing Restrictions No  Balance Screen  Has the patient fallen in the past 6 months No  Prior Function  Level of Independence Independent with household mobility with device  Vocation Retired  Associate Professor  Overall Cognitive Status Within Functional Limits for tasks assessed  Functional Tests  Functional tests Single leg stance;Sit to Stand  Single Leg Stance  Comments Lt 8"; Rt 6"  Sit to Stand  Comments 14.96 seconds to complete 5 sit to stand  Posture/Postural Control  Posture/Postural Control Postural limitations  Postural Limitations Rounded Shoulders;Forward head;Decreased lumbar lordosis;Increased thoracic kyphosis;Posterior pelvic tilt;Flexed trunk  ROM / Strength  AROM / PROM / Strength AROM;Strength  AROM  AROM Assessment Site Cervical;Knee  Cervical - Right Rotation 50  Cervical - Left Rotation 40  Right/Left Knee Right;Left  Right Knee Extension 12  Left Knee Extension 8  Strength  Overall Strength Comments LE functional  Standardized Balance  Assessment  Standardized Balance Assessment Dynamic Gait Index  Dynamic Gait Index  Level Surface 1  Change in Gait Speed 1  Gait with Horizontal Head Turns 1  Gait with Vertical Head Turns 1  Gait and Pivot Turn 1  Step Over Obstacle 1  Step Around Obstacles 1  Steps 1  Total Score 8        01/06/21 0001  Symptom Behavior  Subjective history of current problem [name] states that he becomes dizzy everytime he stands up.  He gets frozen but once he takes a few  steps he feels better.  Type of Dizziness  Imbalance;Lightheadedness  Frequency of Dizziness every time he stands up he feels like he might pass out  Duration of Dizziness less than a minute  Symptom Nature Positional  Aggravating Factors Sit to stand  Relieving Factors Comments (standing for a few moments)  Progression of Symptoms Worse  History of similar episodes has been occuring for about a year now  Oculomotor Exam  Smooth Pursuits  (PT can not follow this direction.)  Saccades Poor trajectory  Oculomotor Exam-Fixation Suppressed   Head Shaking Nystagmus-Horizontal sees 4 eyes  Positional Testing  Dix-Hallpike Dix-Hallpike Right;Dix-Hallpike Left  Dix-Hallpike Right  Dix-Hallpike Right Symptoms No nystagmus  Dix-Hallpike Left  Dix-Hallpike Left Symptoms No nystagmus  Orthostatics  BP sitting 166/86  HR sitting 73  BP standing (after 1 minute) 157/74  HR standing (after 1 minute) 77  BP standing (after 3 minutes) 137/68  HR standing (after 3 minutes) 77       01/06/21 0001  Posture/Postural Control  Posture/Postural Control Postural limitations  Postural Limitations Rounded Shoulders;Forward head;Decreased lumbar lordosis;Increased thoracic kyphosis;Posterior pelvic tilt;Flexed trunk  Exercises  Exercises Lumbar  Lumbar Exercises: Stretches  Active Hamstring Stretch 2 reps;30 seconds  Lumbar Exercises: Seated  Other Seated Lumbar Exercises scapular retraction x 10        Objective measurements completed on examination: See above findings.               PT Education - 01/06/21 1240     Education Details HEP    Person(s) Educated Patient    Methods Explanation;Handout;Demonstration;Tactile cues;Verbal cues    Comprehension Verbalized understanding              PT Short Term Goals - 01/06/21 1555       PT SHORT TERM GOAL #1   Title Pt to be drinking 6-8 glasses of water a day to allow pt to state that he no longer feels dizzy coming  from sit to stand.    Time 3    Period Weeks    Status New    Target Date 01/21/21      PT SHORT TERM GOAL #2   Title PT to be completing a HEP to improve core and LE strength to allow pt to be able to complete 5 sit to stand in 12 seconds of less    Time 3    Period Weeks    Status New      PT SHORT TERM GOAL #3   Title PT to be able to complete single leg stance B for at least 12 seconds. to reduce risk of falls.    Time 3    Period Weeks    Status New               PT Long Term Goals - 01/07/21 1258       PT LONG TERM GOAL #1  Title PT to be completing an advance HEP to be able to state that he is only experienceing dizziness 3-4 x a week    Time 6    Period Weeks    Status New    Target Date 02/17/21      PT LONG TERM GOAL #2   Title PT balance to improve with noted increase of DGI by 5 pts for decreased risk of falls    Time 6    Period Weeks    Status New      PT LONG TERM GOAL #3   Title PT core and LE strength to have improved to allow pt to be able to complete 5 sit to stand in 10 seconds    Time 6    Period Weeks    Status New                    Plan - 01/06/21 1242     Clinical Impression Statement [name] is an 87 yo referred for vestibular therapy.  The pt states that the most difficult time he has if when he stands up.  B/P was taken and noted to drop indicative of orthostatic hypotension.  The therapist encouraged the pt to drink at least 6 to 8 glasses of water a day.  The evaluation demonstrated decreased balance and postural dysfunction.  He had no nystagmus with smooth pursuits, sacaddes or dix halpike manuever.  [name] will benefit from skilled PT for high balance activity, postural exercises, and improved cervical ROM.    Personal Factors and Comorbidities Age    Examination-Activity Limitations Locomotion Level    Examination-Participation Restrictions Cleaning;Driving;Yard Work    [employer]chant [employer] officer  Evolving/Moderate complexity    Clinical Decision Making Moderate    Rehab Potential Fair    PT Frequency 2x / week    PT Duration 6 weeks    PT Treatment/Interventions Patient/family education;Functional mobility training;Therapeutic activities;Therapeutic exercise;Balance training    PT Next Visit Plan Take orthostatic blood pressure if once again there is a significant decrease and pt has drank his water as encouraged contact MD.  Begin stretching for cervical and LE, strengthening for core and balance activty.    PT Home Exercise Plan tall sitting with scapular retraction, Quad set, drink 6-8 glasses of water a day.    Consulted and Agree with Plan of Care Patient             Patient will benefit from skilled therapeutic intervention in order to improve the following deficits and impairments:  Abnormal gait, Decreased activity tolerance, Decreased balance, Dizziness, Postural dysfunction, Decreased mobility, Decreased range of motion  Visit Diagnosis: Other abnormalities of gait and mobility  Dizziness and giddiness     Problem List Patient Active Problem List   Diagnosis Date Noted   Permanent atrial fibrillation ([place]) 09/07/2020   Chronic atrial fibrillation ([place]) 03/30/2016   Encounter for therapeutic drug monitoring 07/21/2013   SOB (shortness of breath) 12/23/2012   Essential hypertension, benign    H/O amiodarone therapy    Secondary cardiomyopathy, unspecified    Encounter for long-term (current) use of anticoagulants 10/02/2010   ATRIAL FLUTTER, PAROXYSMAL 06/22/2009   Rayetta Humphrey, PT CLT [id number]  01/07/2021, 1:03 PM  [facility] [address] [place], [place], [address] Phone: [phone]   Fax:  [phone]  Name: [name] MRN: [id number] Date of Birth: [birth year]

## 2021-01-07 NOTE — Progress Notes (Signed)
   01/06/21 0001  Symptom Behavior  Subjective history of current problem Mr. Doo states that he becomes dizzy everytime he stands up.  He gets frozen but once he takes a few steps he feels better.  Type of Dizziness  Imbalance;Lightheadedness  Frequency of Dizziness every time he stands up he feels like he might pass out  Duration of Dizziness less than a minute  Symptom Nature Positional  Aggravating Factors Sit to stand  Relieving Factors Comments (standing for a few moments)  Progression of Symptoms Worse  History of similar episodes has been occuring for about a year now  Oculomotor Exam  Smooth Pursuits  (PT can not follow this direction.)  Saccades Poor trajectory  Oculomotor Exam-Fixation Suppressed   Head Shaking Nystagmus-Horizontal sees 4 eyes  Positional Testing  Dix-Hallpike Dix-Hallpike Right;Dix-Hallpike Left  Dix-Hallpike Right  Dix-Hallpike Right Symptoms No nystagmus  Dix-Hallpike Left  Dix-Hallpike Left Symptoms No nystagmus  Orthostatics  BP sitting 166/86  HR sitting 73  BP standing (after 1 minute) 157/74  HR standing (after 1 minute) 77  BP standing (after 3 minutes) 137/68  HR standing (after 3 minutes) 77

## 2021-01-17 ENCOUNTER — Ambulatory Visit (HOSPITAL_COMMUNITY): Payer: Medicare Other | Admitting: Physical Therapy

## 2021-01-17 ENCOUNTER — Encounter (HOSPITAL_COMMUNITY): Payer: Self-pay | Admitting: Physical Therapy

## 2021-01-17 ENCOUNTER — Other Ambulatory Visit: Payer: Self-pay

## 2021-01-17 DIAGNOSIS — R2689 Other abnormalities of gait and mobility: Secondary | ICD-10-CM

## 2021-01-17 DIAGNOSIS — R42 Dizziness and giddiness: Secondary | ICD-10-CM

## 2021-01-17 NOTE — Therapy (Signed)
[facility] [place], [place], [address] Phone: [phone]   Fax:  [phone]  Physical Therapy Treatment  Patient Details  Name: [name] MRN: [id number] Date of Birth: 01-05-1934 Referring Provider (PT): Tamela Gammon   Encounter Date: 01/17/2021   PT End of Session - 01/17/21 1449     Visit Number 2    Number of Visits 12    Date for PT Re-Evaluation 02/18/21    Authorization - Number of Visits 2    Progress Note Due on Visit 10    PT Start Time 1448    PT Stop Time 1516    PT Time Calculation (min) 28 min    Equipment Utilized During Treatment Gait belt    Activity Tolerance Patient tolerated treatment well    Behavior During Therapy WFL for tasks assessed/performed             Past Medical History:  Diagnosis Date   Atrial flutter, paroxysmal ([place])    a. Hx of TEE with LAA clots. b. s/p ESP/RFCA of atrial flutter 08/2008. b. Recurrence of A-flutter after ablation/atypical, requiring DCCV and amiodaorone.   BPH (benign prostatic hypertrophy)    Cardiomyopathy ([place])    a. Prior EF 30-35%, felt tachycardia induced cardiomyopathyy. b. Improved after cardioversion and restoration of normal sinus rhythm - EF 65-70% by echo 10/13   Essential hypertension, benign    H/O amiodarone therapy    Used for atrial flutter after return of flutter after ablation   Shortness of breath for last year   when pt lies down    Urination, excessive at night    Warfarin anticoagulation    For atrial flutter    Past Surgical History:  Procedure Laterality Date   CYSTOSCOPY/RETROGRADE/URETEROSCOPY Right 08/26/2017   Procedure: CYSTOSCOPY/ RIGHT RETROGRADE PYELOGRAM/ RIGHT URETEROSCOPY WITH RIGHT URETERAL STENT PLACEMENT;  Surgeon: Cleon Gustin, MD;  Location: [facility];  Service: Urology;  Laterality: Right;   FEMORAL HERNIA REPAIR     x2   TOTAL KNEE ARTHROPLASTY Right 2004   TRANSURETHRAL PROSTATECTOMY WITH  GYRUS INSTRUMENTS N/A 03/12/2013   Procedure: TRANSURETHRAL PROSTATECTOMY WITH GYRUS INSTRUMENTS;  Surgeon: Ailene Rud, MD;  Location: [facility];  Service: Urology;  Laterality: N/A;   TURP VAPORIZATION     UMBILICAL HERNIA REPAIR      There were no vitals filed for this visit.   Subjective Assessment - 01/17/21 1452     Subjective Reports that when he is siting it doesn't bother him. STates that he has dizziness when he sits for long period of times and he tries to get up and he gets lightheaded and dizzy. States he had 5/10 dizziness wiht just gettign up from seated position. States he is trying to drink more water but he doesn't know how much he has been drinking.                [facility] PT Assessment - 01/17/21 0001       Assessment   Medical Diagnosis Dizziness    Referring Provider (PT) Tamela Gammon                eated BP 126/70  Standing BP 105/53                   PT Education - 01/17/21 1457     Education Details on importance of staying hydrated and f/ with MD secondary to symptoms.    Person(s) Educated  Patient    Methods Explanation    Comprehension Verbalized understanding              PT Short Term Goals - 01/06/21 1555       PT SHORT TERM GOAL #1   Title Pt to be drinking 6-8 glasses of water a day to allow pt to state that he no longer feels dizzy coming from sit to stand.    Time 3    Period Weeks    Status New    Target Date 01/21/21      PT SHORT TERM GOAL #2   Title PT to be completing a HEP to improve core and LE strength to allow pt to be able to complete 5 sit to stand in 12 seconds of less    Time 3    Period Weeks    Status New      PT SHORT TERM GOAL #3   Title PT to be able to complete single leg stance B for at least 12 seconds. to reduce risk of falls.    Time 3    Period Weeks    Status New               PT Long Term Goals - 01/07/21 1258       PT LONG TERM GOAL #1   Title PT to  be completing an advance HEP to be able to state that he is only experienceing dizziness 3-4 x a week    Time 6    Period Weeks    Status New    Target Date 02/17/21      PT LONG TERM GOAL #2   Title PT balance to improve with noted increase of DGI by 5 pts for decreased risk of falls    Time 6    Period Weeks    Status New      PT LONG TERM GOAL #3   Title PT core and LE strength to have improved to allow pt to be able to complete 5 sit to stand in 10 seconds    Time 6    Period Weeks    Status New                   Plan - 01/17/21 1522     Clinical Impression Statement Session focused on educating patient about current presentation and symptoms and about following up with MD. No PT performed on today's date secondary to drop in BP with standing and continued symptoms reported of lightheadedness and feeling like he is going to pass out. Instructed patient to record daily water intake and follow up with MD. PT currently on hold.    Personal Factors and Comorbidities Age    Examination-Activity Limitations Locomotion Level    Examination-Participation Restrictions Cleaning;Driving;Yard Work    [employer]chant [employer] officer Evolving/Moderate complexity    Rehab Potential Fair    PT Frequency 2x / week    PT Duration 6 weeks    PT Treatment/Interventions Patient/family education;Functional mobility training;Therapeutic activities;Therapeutic exercise;Balance training    PT Next Visit Plan Take orthostatic blood pressure if once again there is a significant decrease and pt has drank his water as encouraged contact MD.  Begin stretching for cervical and LE, strengthening for core and balance activty.    PT Home Exercise Plan tall sitting with scapular retraction, Quad set, drink 6-8 glasses of water a day.    Consulted and Agree with Plan of Care Patient  Patient will benefit from skilled therapeutic intervention in order to improve the following deficits  and impairments:  Abnormal gait, Decreased activity tolerance, Decreased balance, Dizziness, Postural dysfunction, Decreased mobility, Decreased range of motion  Visit Diagnosis: Other abnormalities of gait and mobility  Dizziness and giddiness     Problem List Patient Active Problem List   Diagnosis Date Noted   Permanent atrial fibrillation ([place]) 09/07/2020   Chronic atrial fibrillation ([place]) 03/30/2016   Encounter for therapeutic drug monitoring 07/21/2013   SOB (shortness of breath) 12/23/2012   Essential hypertension, benign    H/O amiodarone therapy    Secondary cardiomyopathy, unspecified    Encounter for long-term (current) use of anticoagulants 10/02/2010   ATRIAL FLUTTER, PAROXYSMAL 06/22/2009   3:23 PM, 01/17/21 Jerene Pitch, DPT Physical Therapy with [facility]  [phone] office   [facility] [address] [place], [place], [address] Phone: [phone]   Fax:  [phone]  Name: [name] MRN: [id number] Date of Birth: [birth year]

## 2021-01-18 DIAGNOSIS — I959 Hypotension, unspecified: Secondary | ICD-10-CM | POA: Diagnosis not present

## 2021-01-18 DIAGNOSIS — Z79899 Other long term (current) drug therapy: Secondary | ICD-10-CM | POA: Diagnosis not present

## 2021-01-18 DIAGNOSIS — Z299 Encounter for prophylactic measures, unspecified: Secondary | ICD-10-CM | POA: Diagnosis not present

## 2021-01-18 DIAGNOSIS — I10 Essential (primary) hypertension: Secondary | ICD-10-CM | POA: Diagnosis not present

## 2021-01-18 DIAGNOSIS — R5383 Other fatigue: Secondary | ICD-10-CM | POA: Diagnosis not present

## 2021-01-19 ENCOUNTER — Telehealth (HOSPITAL_COMMUNITY): Payer: Self-pay | Admitting: Physical Therapy

## 2021-01-19 NOTE — Telephone Encounter (Signed)
Called pt about following up with MD and he said he went for some blood work but had not heard back. Discussed patient being placed on hold until <MD sorts out dizziness/lightheadedness and to call to schedule when this symptom is resolved. Schedule with either Jenny Reichmann or Selinda Eon when he calls to schedule per patient request.  11:24 AM, 01/19/21 Jerene Pitch, DPT Physical Therapy with [facility]  [phone] office

## 2021-01-24 ENCOUNTER — Ambulatory Visit (HOSPITAL_COMMUNITY): Payer: Medicare Other | Admitting: Physical Therapy

## 2021-01-27 ENCOUNTER — Encounter (HOSPITAL_COMMUNITY): Payer: Medicare Other

## 2021-01-30 ENCOUNTER — Ambulatory Visit (INDEPENDENT_AMBULATORY_CARE_PROVIDER_SITE_OTHER): Payer: Medicare Other | Admitting: *Deleted

## 2021-01-30 ENCOUNTER — Telehealth: Payer: Self-pay | Admitting: Family Medicine

## 2021-01-30 DIAGNOSIS — Z5181 Encounter for therapeutic drug level monitoring: Secondary | ICD-10-CM

## 2021-01-30 DIAGNOSIS — I482 Chronic atrial fibrillation, unspecified: Secondary | ICD-10-CM

## 2021-01-30 LAB — POCT INR: INR: 2.1 (ref 2.0–3.0)

## 2021-01-30 NOTE — Telephone Encounter (Signed)
Pt states he's having problems w/ his BP - when he sits it's a different reading than when he stands, having some dizziness, and finger numbness, weakness  -no SOB or Chest pain

## 2021-01-30 NOTE — Telephone Encounter (Signed)
Call placed to patient.  Stated that he has been having this dizziness or vertigo about a year now.  Stated that this initially started with numbness in his finger tips x 1 year as well.  No c/o SOB, chest pain, or weight gain.  Does c/o weakness & dizziness.  States that he has not been writing any numbers down, but feels "woozy" upon standing from the seated position.  Stated that he has had an issue with vertigo for quite some time as well.  Also, states he just can't walk as good as he used to.  Says he has discussed this with his pcp & he ordered physical therapy.  Stated that this was stopped by them & told that he needed to get his vertigo under control before he came back.    Patient was last seen 09/08/2020 by Dr. Percival Spanish - BP at that visit was 118/68  89.  No upcoming appointments scheduled for follow up with his pcp ([facility]).  Appointment has already been scheduled with you on 02/13/2021.  I have advised him in the meantime to start logging his readings & especially when he feels symptomatic to bring with him to the visit.

## 2021-01-30 NOTE — Patient Instructions (Signed)
Continue warfarin 1 tablet daily except 1 1/2 tablets on Tuesdays and Saturdays Recheck in 6 weeks 

## 2021-01-31 ENCOUNTER — Encounter (HOSPITAL_COMMUNITY): Payer: Medicare Other | Admitting: Physical Therapy

## 2021-01-31 NOTE — Telephone Encounter (Signed)
No answer

## 2021-02-02 ENCOUNTER — Encounter (HOSPITAL_COMMUNITY): Payer: Medicare Other | Admitting: Physical Therapy

## 2021-02-07 ENCOUNTER — Encounter (HOSPITAL_COMMUNITY): Payer: Medicare Other | Admitting: Physical Therapy

## 2021-02-08 NOTE — Telephone Encounter (Signed)
Appointment scheduled for 02/13/2021 with Katina Dung, NP.

## 2021-02-09 ENCOUNTER — Encounter (HOSPITAL_COMMUNITY): Payer: Medicare Other | Admitting: Physical Therapy

## 2021-02-11 NOTE — Progress Notes (Signed)
Cardiology Office Note  Date: 02/13/2021   ID: [name], [name] 04/14/1934, MRN [id number]  PCP:  Glenda Chroman, MD  Cardiologist:  Minus Breeding, MD Electrophysiologist:  None   Chief Complaint: BP problems, dizziness, postural lightheadedness  History of Present Illness: [name] is a 87 y.o. male with a history of permanent atrial fibrillation, ascending aortic aneurysm 44 mm,  cardiomyopathy, HTN, SOB.   He was last seen by Dr. Percival Spanish on 09/08/2020. He was experiencing continued weakness and fatigue. He had some low blood pressures and his BB was reduced in dosage but this did not help.  He was having occasional dizziness and wooziness but no presyncope or syncope. He was not sleeping well. He was tolerating anticoagulation . He was unable to afford DOAC therapy. HR was reasonably controlled. CBC was ordered. History of cardiomyopathy with EF of 59% on stress test 2020. Normal on echo. TSH and BMET were ordered in addition to CBC for fatigue and DOE.   He is here for follow-up.  He brings with him a log of blood pressures which appear to range anywhere from 99 systolic to [id number] systolic.  Average appears to run in the range of [id number] systolic.  Diastolics appear to range from 60s to 80s.  He states he has issues with vertigo but does not have any medications for it.  We spoke spoke of over-the-counter meclizine if he wanted to try it.  He states he is doing well with the reduced dose of Toprol-XL 12.5 mg daily.  His atrial fibrillation rate is controlled on EKG today with a rate of 76.  He continues on Coumadin without any bleeding.  He follows in the Coumadin clinic.   Past Medical History:  Diagnosis Date   Atrial flutter, paroxysmal ([place])    a. Hx of TEE with LAA clots. b. s/p ESP/RFCA of atrial flutter 08/2008. b. Recurrence of A-flutter after ablation/atypical, requiring DCCV and amiodaorone.   BPH (benign prostatic hypertrophy)    Cardiomyopathy ([place])    a. Prior EF  30-35%, felt tachycardia induced cardiomyopathyy. b. Improved after cardioversion and restoration of normal sinus rhythm - EF 65-70% by echo 10/13   Essential hypertension, benign    H/O amiodarone therapy    Used for atrial flutter after return of flutter after ablation   Shortness of breath for last year   when pt lies down    Urination, excessive at night    Warfarin anticoagulation    For atrial flutter    Past Surgical History:  Procedure Laterality Date   CYSTOSCOPY/RETROGRADE/URETEROSCOPY Right 08/26/2017   Procedure: CYSTOSCOPY/ RIGHT RETROGRADE PYELOGRAM/ RIGHT URETEROSCOPY WITH RIGHT URETERAL STENT PLACEMENT;  Surgeon: Cleon Gustin, MD;  Location: [facility];  Service: Urology;  Laterality: Right;   FEMORAL HERNIA REPAIR     x2   TOTAL KNEE ARTHROPLASTY Right 2004   TRANSURETHRAL PROSTATECTOMY WITH GYRUS INSTRUMENTS N/A 03/12/2013   Procedure: TRANSURETHRAL PROSTATECTOMY WITH GYRUS INSTRUMENTS;  Surgeon: Ailene Rud, MD;  Location: [facility];  Service: Urology;  Laterality: N/A;   TURP VAPORIZATION     UMBILICAL HERNIA REPAIR      Current Outpatient Medications  Medication Sig Dispense Refill   atorvastatin (LIPITOR) 10 MG tablet Take 10 mg by mouth daily.      latanoprost (XALATAN) 0.005 % ophthalmic solution Place 1 drop into both eyes once a week.      metoprolol succinate (TOPROL-XL) 25 MG 24 hr tablet TAKE 1/2 TABLET BY  MOUTH DAILY 30 tablet 3   Omega-3 Fatty Acids (FISH OIL) 1000 MG CAPS Take 1,000 mg by mouth. OCCASIONALLY     warfarin (COUMADIN) 5 MG tablet TAKE ONE TABLET BY MOUTH DAILY EXCEPT TAKE 1 AND 1/2 TABLETS ON TUESDAY AND SATURDAY. 45 tablet 4   alfuzosin (UROXATRAL) 10 MG 24 hr tablet Take 10 mg by mouth at bedtime.  (Patient not taking: Reported on 02/13/2021)     No current facility-administered medications for this visit.   Allergies:  Penicillins and Sulfonamide derivatives   Social History: The patient  reports that he has never smoked. He  has never used smokeless tobacco. He reports that he does not drink alcohol and does not use drugs.   Family History: The patient's family history includes Dementia in his father and mother; Diabetes in his mother and paternal grandmother.   ROS:  Please see the history of present illness. Otherwise, complete review of systems is positive for none.  All other systems are reviewed and negative.   Physical Exam: VS:  BP 118/80   Pulse 87   Ht '5\' 10"'$  (1.778 m)   Wt 201 lb 9.6 oz (91.4 kg)   SpO2 97%   BMI 28.93 kg/m , BMI Body mass index is 28.93 kg/m.  Wt Readings from Last 3 Encounters:  02/13/21 201 lb 9.6 oz (91.4 kg)  08/27/19 206 lb (93.4 kg)  03/04/19 210 lb (95.3 kg)    General: Patient appears comfortable at rest. Neck: Supple, no elevated JVP or carotid bruits, no thyromegaly. Lungs: Clear to auscultation, nonlabored breathing at rest. Cardiac: Irregularly irregular rate and rhythm, no S3 or significant systolic murmur, no pericardial rub. Extremities: No pitting edema, distal pulses 2+. Skin: Warm and dry. Musculoskeletal: No kyphosis. Neuropsychiatric: Alert and oriented x3, affect grossly appropriate.  ECG: 02/13/2021 atrial fibrillation rate of 76, left axis deviation.  Recent Labwork: 09/12/2020: BUN 36; Creatinine, Ser 1.17; Hemoglobin 15.1; Platelets 148; Potassium 4.1; Sodium 136; TSH 2.112  No results found for: CHOL, TRIG, HDL, CHOLHDL, VLDL, LDLCALC, LDLDIRECT  Other Studies Reviewed Today:   Stress test 03/11/2019 Study Result  Narrative & Impression  There was no ST segment deviation noted during stress. The study is normal. No ischemic zones. This is a low risk study.  Nuclear stress EF: 59%.   12/15/2018 Cardiac monitor Study Highlights  Atrial fibrillation and flutter noted. Average heart rate 75 bpm. There were frequent PVCs and multiple 3 beat runs of nonsustained ventricular tachycardia, one 9 beat run, and one 11 beat run of nonsustained  ventricular tachycardia. There was a 3.7-second pause at 2:51 PM.   12/03/2018 Echocardiogram   1. The left ventricle has normal systolic function with an ejection  fraction of 60-65%. The cavity size was normal. There is severe asymmetric  left ventricular hypertrophy. Left ventricular diastolic Doppler  parameters are indeterminate in the setting  of apparent atrial flutter. No evidence of left ventricular regional wall  motion abnormalities.   2. The right ventricle has normal systolic function. The cavity was  normal. There is no increase in right ventricular wall thickness. Right  ventricular systolic pressure is mildly elevated with an estimated  pressure of 36.7 mmHg.   3. Left atrial size was mildly dilated.   4. Right atrial size was mildly dilated.   5. The aortic valve is tricuspid. Mild aortic annular calcification  noted.   6. The mitral valve is grossly normal.   7. The tricuspid valve is grossly normal.  8. There is moderate dilatation of the ascending aorta measuring 44 mm.   9. The inferior vena cava was dilated in size with >50% respiratory  variability.   Assessment and Plan:  1. Permanent atrial fibrillation ([place])   2. History of cardiomyopathy   3. DOE (dyspnea on exertion)   4. Other fatigue   5. Postural dizziness    1. Permanent atrial fibrillation ([place]) Continues in atrial fibrillation with a rate of 76 on EKG today.  He continues on Toprol-XL 12.5 mg p.o. daily.  He continues on Coumadin as directed by Coumadin clinic.   2. History of cardiomyopathy Most recent echocardiogram 12/13/2018 showed EF of 60 to 65%.  Severe asymmetric LVH.  Diastolic parameters indeterminate in setting of atrial flutter.  LA mildly dilated, RA mildly dilated, moderate dilatation of the aorta measuring 44 mm.  Please get a repeat echocardiogram to reassess aortic dilatation, LVH, and diastolic function.  3. DOE (dyspnea on exertion) Currently denies any DOE.  States he is not  very active on a daily basis.  4. Other fatigue States he is not able to do what he used to be able to do and gives out fairly quickly when doing exertional activity.  He states this could be age-related.  He is also on Toprol-XL which could cause some activity intolerance.Labs ordered by Dr. Percival Spanish in March showed elevated glucose of 123, normal CBC except for slightly decreased platelets at 148.  TSH 2.12  5. Postural dizziness He describes he has vertigo but does not take anything for it.  Mentioned meclizine which can be obtained over-the-counter if he continues to have problems.  Medication Adjustments/Labs and Tests Ordered: Current medicines are reviewed at length with the patient today.  Concerns regarding medicines are outlined above.   Disposition: Follow-up with Dr. Percival Spanish or APP 6 months  Signed, Levell July, NP 02/13/2021 9:11 AM    [facility] [place], [place], [place] [address] Phone: [phone]; Fax: [phone]

## 2021-02-13 ENCOUNTER — Ambulatory Visit: Payer: Medicare Other | Admitting: Family Medicine

## 2021-02-13 ENCOUNTER — Encounter: Payer: Self-pay | Admitting: Family Medicine

## 2021-02-13 VITALS — BP 118/80 | HR 87 | Ht 70.0 in | Wt 201.6 lb

## 2021-02-13 DIAGNOSIS — R5383 Other fatigue: Secondary | ICD-10-CM

## 2021-02-13 DIAGNOSIS — R0609 Other forms of dyspnea: Secondary | ICD-10-CM

## 2021-02-13 DIAGNOSIS — I4821 Permanent atrial fibrillation: Secondary | ICD-10-CM | POA: Diagnosis not present

## 2021-02-13 DIAGNOSIS — R0600 Dyspnea, unspecified: Secondary | ICD-10-CM | POA: Diagnosis not present

## 2021-02-13 DIAGNOSIS — I712 Thoracic aortic aneurysm, without rupture: Secondary | ICD-10-CM | POA: Diagnosis not present

## 2021-02-13 DIAGNOSIS — Z8679 Personal history of other diseases of the circulatory system: Secondary | ICD-10-CM

## 2021-02-13 DIAGNOSIS — I7121 Aneurysm of the ascending aorta, without rupture: Secondary | ICD-10-CM

## 2021-02-13 DIAGNOSIS — R42 Dizziness and giddiness: Secondary | ICD-10-CM | POA: Diagnosis not present

## 2021-02-13 NOTE — Addendum Note (Signed)
Addended by: Laurine Blazer on: 02/13/2021 10:46 AM   Modules accepted: Orders

## 2021-02-13 NOTE — Patient Instructions (Addendum)

## 2021-02-17 ENCOUNTER — Other Ambulatory Visit: Payer: Self-pay | Admitting: Cardiology

## 2021-02-21 ENCOUNTER — Ambulatory Visit (HOSPITAL_COMMUNITY): Payer: Medicare Other

## 2021-03-08 ENCOUNTER — Other Ambulatory Visit: Payer: Self-pay

## 2021-03-08 ENCOUNTER — Ambulatory Visit (HOSPITAL_COMMUNITY)
Admission: RE | Admit: 2021-03-08 | Discharge: 2021-03-08 | Disposition: A | Discharge status: Home / Self Care | Payer: Medicare Other | Source: Ambulatory Visit | Attending: Family Medicine | Admitting: Family Medicine

## 2021-03-08 DIAGNOSIS — I712 Thoracic aortic aneurysm, without rupture: Secondary | ICD-10-CM | POA: Diagnosis not present

## 2021-03-08 DIAGNOSIS — I7121 Aneurysm of the ascending aorta, without rupture: Secondary | ICD-10-CM

## 2021-03-08 LAB — ECHOCARDIOGRAM COMPLETE
AR max vel: 2.56 cm2
AV Area VTI: 2.64 cm2
AV Area mean vel: 2.58 cm2
AV Mean grad: 2 mmHg
AV Peak grad: 4.3 mmHg
Ao pk vel: 1.04 m/s
Area-P 1/2: 3.91 cm2
MV VTI: 2.1 cm2
S' Lateral: 2.6 cm

## 2021-03-14 ENCOUNTER — Other Ambulatory Visit: Payer: Self-pay | Admitting: Cardiology

## 2021-03-14 ENCOUNTER — Telehealth: Payer: Self-pay | Admitting: *Deleted

## 2021-03-14 NOTE — Telephone Encounter (Signed)
Elby Showers, LPN  4/71/8550  1:58 PM EDT Back to Top    Notified, copy to pcp.

## 2021-03-14 NOTE — Telephone Encounter (Signed)
-----   Message from Verta Ellen., NP sent at 03/08/2021  6:03 PM EDT ----- Please call the patient and let him know the echocardiogram showed he had good pumping function of his heart. The main pumping chamber is slightly more muscular than normal. Best management is to keep blood pressure at or below 130/80 consistently and mange all other risk factors. Has a slightly leaking mitral valve. This is common with aging. There was mild dilation of the aorta measure at 40 mm. This is actually better than the previous study in 2020 when it was 44 mm. Thank You   Verta Ellen, NP  03/08/2021 5:56 PM

## 2021-05-25 ENCOUNTER — Ambulatory Visit (INDEPENDENT_AMBULATORY_CARE_PROVIDER_SITE_OTHER): Payer: Medicare Other | Admitting: *Deleted

## 2021-05-25 DIAGNOSIS — Z5181 Encounter for therapeutic drug level monitoring: Secondary | ICD-10-CM

## 2021-05-25 DIAGNOSIS — I482 Chronic atrial fibrillation, unspecified: Secondary | ICD-10-CM

## 2021-05-25 LAB — POCT INR: INR: 2.8 (ref 2.0–3.0)

## 2021-05-25 NOTE — Patient Instructions (Signed)
Continue warfarin 1 tablet daily except 1 1/2 tablets on Tuesdays and Saturdays Recheck in 6 weeks 

## 2021-06-14 ENCOUNTER — Encounter (HOSPITAL_COMMUNITY): Payer: Self-pay | Admitting: Physical Therapy

## 2021-06-14 NOTE — Therapy (Signed)
[facility] [place], [place], [address] Phone: [phone]   Fax:  [phone]  Patient Details  Name: [name] MRN: [id number] Date of Birth: 08-20-1933 Referring Provider:  No ref. provider found  Encounter Date: 06/14/2021  PHYSICAL THERAPY DISCHARGE SUMMARY  Visits from Start of Care: 3  Current functional level related to goals / functional outcomes: Unknown as pt stopped coming to treatment   Remaining deficits: Unknown as pt stopped coming to treatment   Education / Equipment: HEP   Patient agrees to discharge. Patient goals were  unknown . Patient is being discharged due to not returning since the last visit.   Rayetta Humphrey, PT CLT [phone] 06/14/2021, 3:24 PM  [facility] [address] [place], [place], [address] Phone: [phone]   Fax:  [phone]

## 2021-06-28 IMAGING — US US FNA BIOPSY THYROID 1ST LESION
1 series · 13 of 18 positions shown · non-contrast
Comparison: Ultrasound soft tissue neck dated 10/26/2019

MEDICATIONS:
5 mL 1% lidocaine

COMPLICATIONS:
None immediate.

INDICATION: Indeterminate left mid thyroid nodule. Per patient he believes this
nodule was previously biopsied many years ago and was reportedly
benign. Request for fine-needle aspiration based on recent imaging.

EXAM:
ULTRASOUND GUIDED FINE NEEDLE ASPIRATION OF INDETERMINATE THYROID
NODULE
TECHNIQUE: Informed written consent was obtained from the patient after a
discussion of the risks, benefits and alternatives to treatment.
Questions regarding the procedure were encouraged and answered. A
timeout was performed prior to the initiation of the procedure.

[Series 1: us fna biopsy thyroid 1st lesion · 0.10mm/px · 18 acquisitions, 13 frames shown]
[im 1/18]
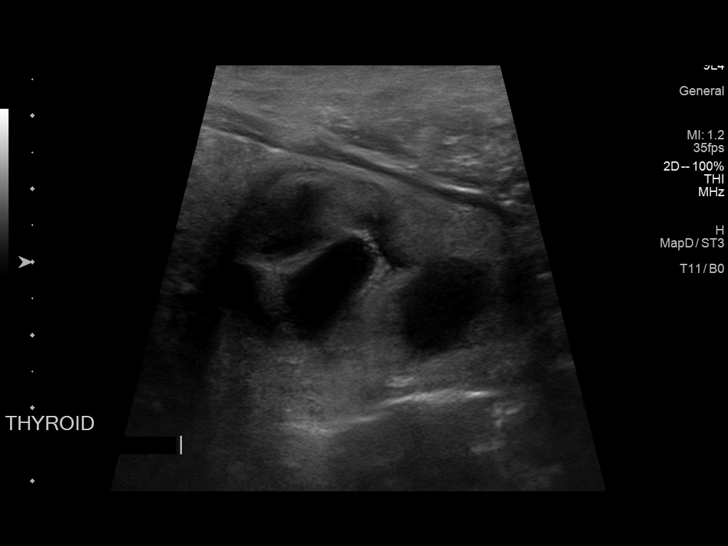
[im 3/18]
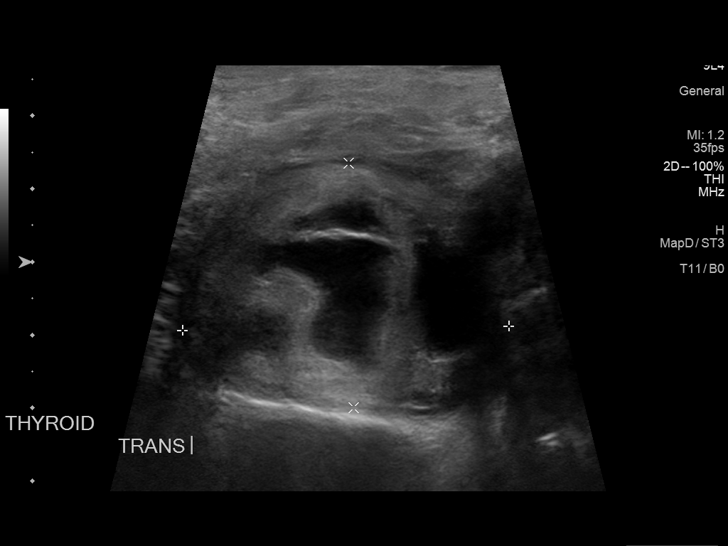
[im 4/18]
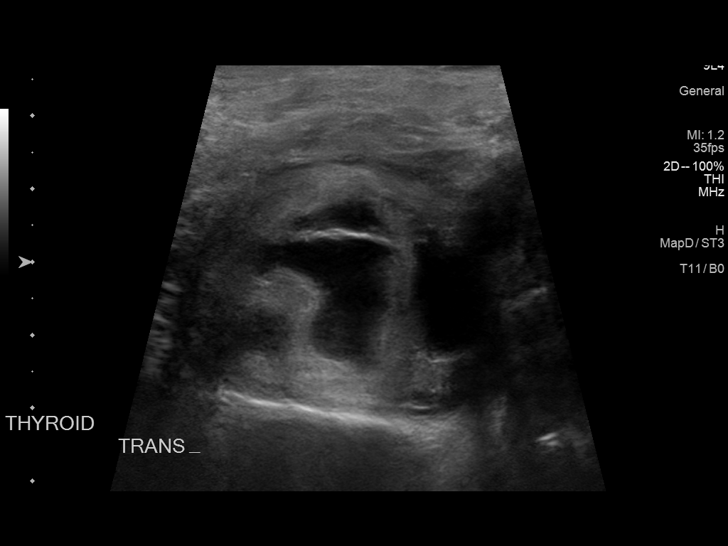
[im 5/18]
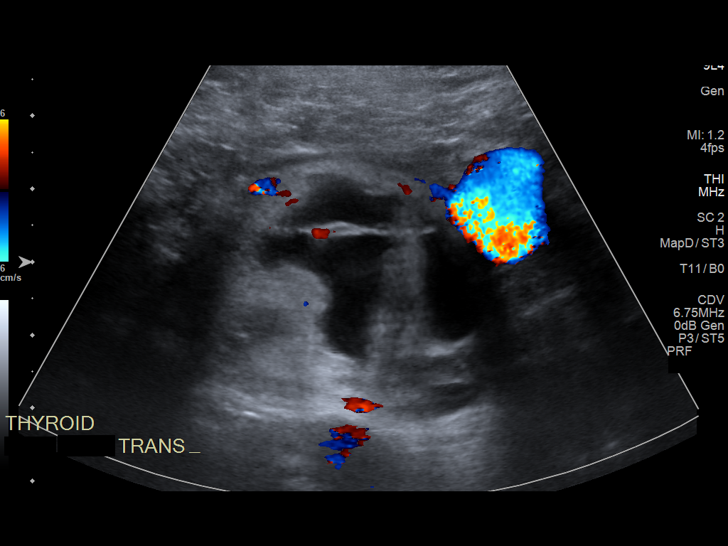
[im 7/18]
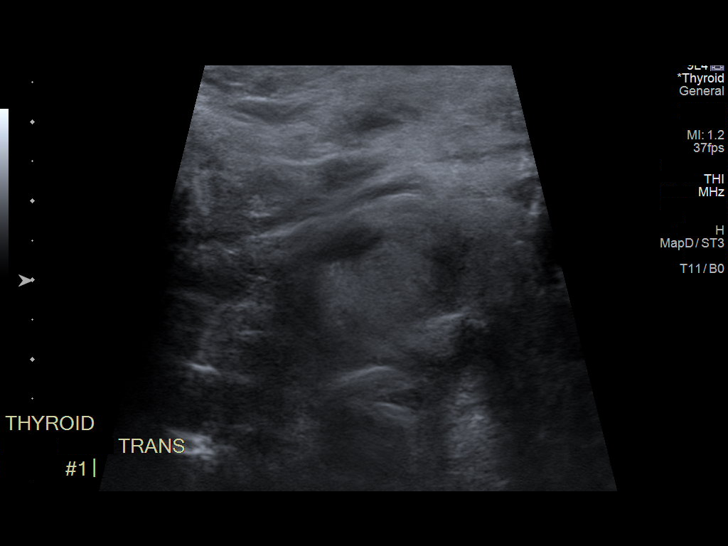
[im 8/18]
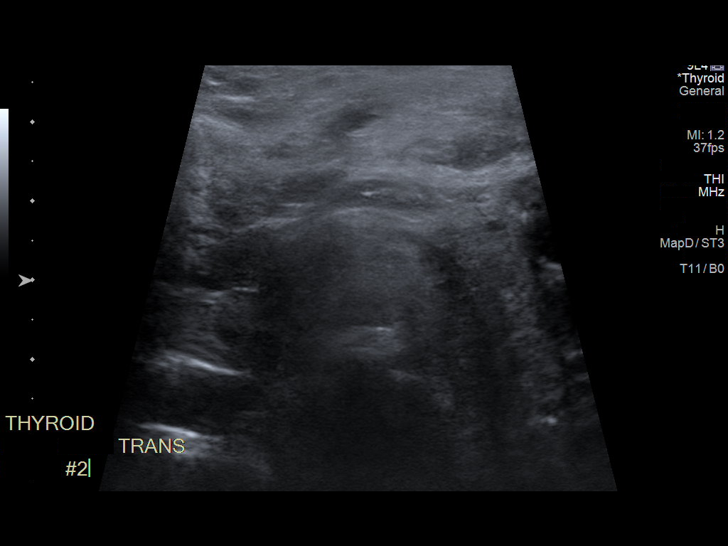
[im 10/18]
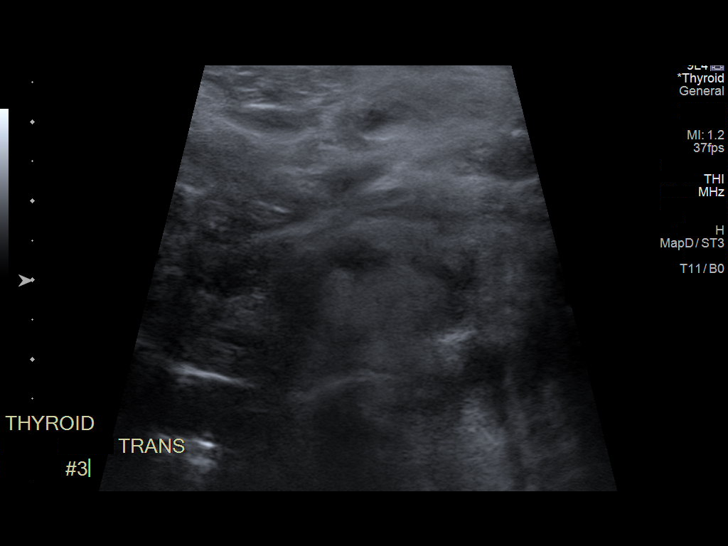
[im 11/18]
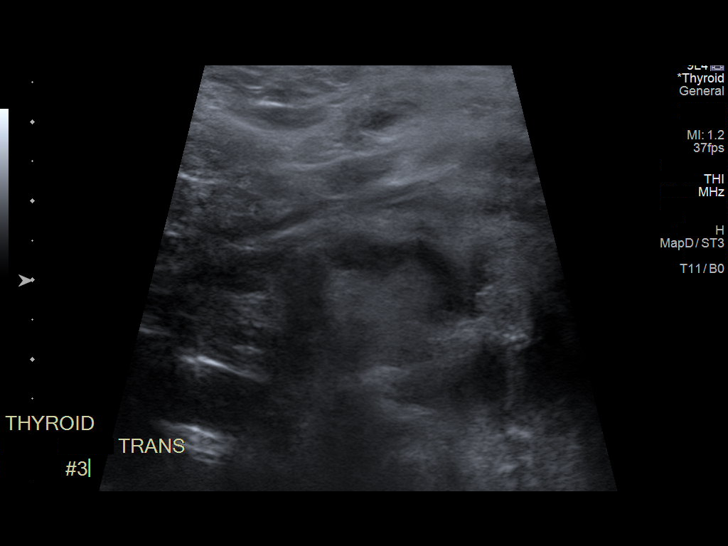
[im 12/18]
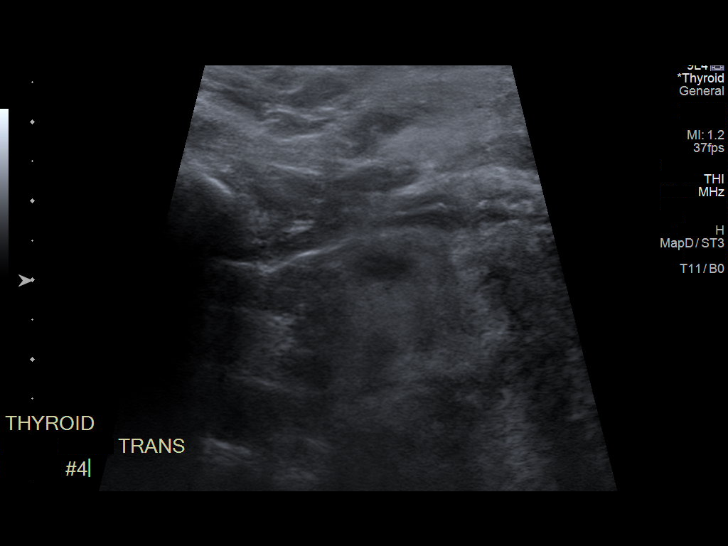
[im 14/18]
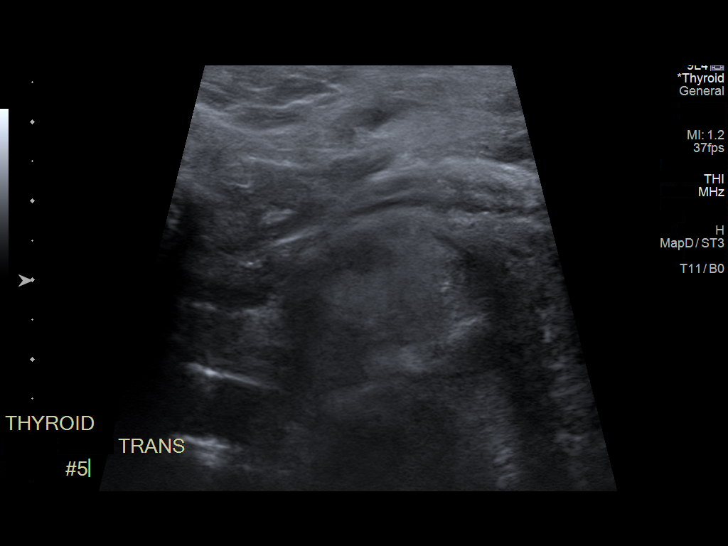
[im 15/18]
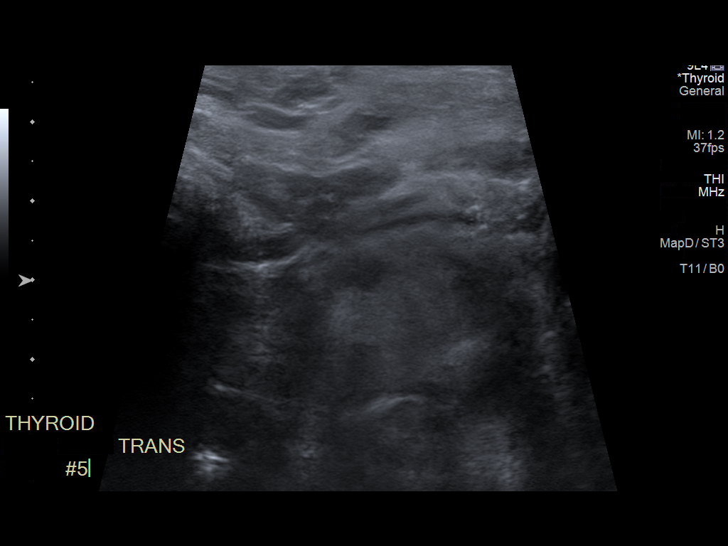
[im 16/18]
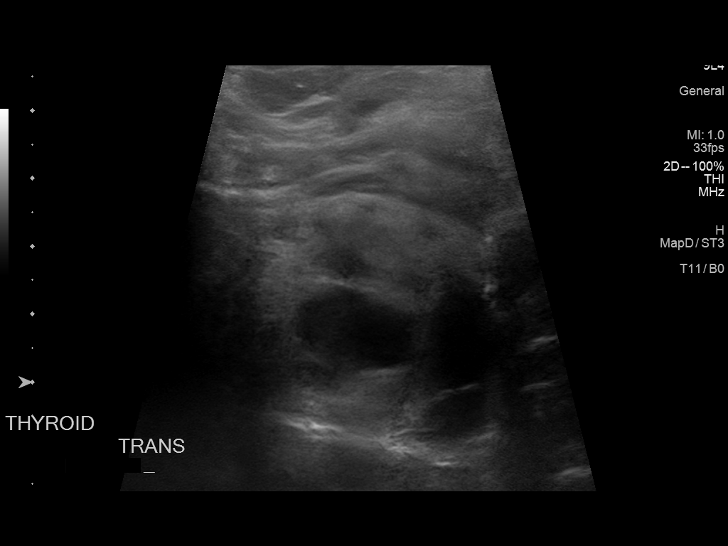
[im 18/18]
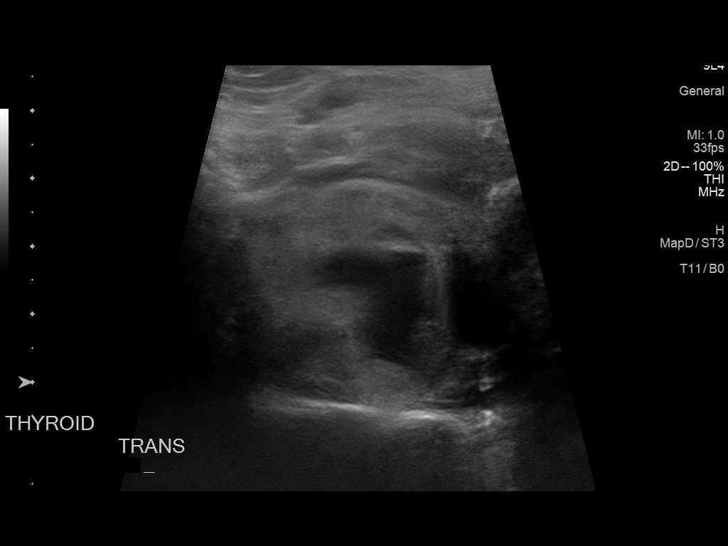

[13 of 18 positions shown; findings below may reference images not displayed]

Pre-procedural ultrasound scanning demonstrated unchanged size and
appearance of the indeterminate nodule within the left mid thyroid
gland

The procedure was planned. The neck was prepped in the usual sterile
fashion, and a sterile drape was applied covering the operative
field. A timeout was performed prior to the initiation of the
procedure. Local anesthesia was provided with 1% lidocaine.

Under direct ultrasound guidance, 5 FNA biopsies were performed of
the indeterminate left mid thyroid nodule with a 25 gauge needle, 2
of which were sent for Afirma testing. Multiple ultrasound images
were saved for procedural documentation purposes. The samples were
prepared and submitted to pathology.

Limited post procedural scanning was negative for hematoma or
additional complication. Dressings were placed. The patient
tolerated the above procedures procedure well without immediate
postprocedural complication.
FINDINGS: Nodule reference number based on prior diagnostic ultrasound: 1

Maximum size: 3.2 cm

Location: Left; Mid

ACR TI-RADS risk category: None listed

Reason for biopsy: patient/referrer request

Ultrasound imaging confirms appropriate placement of the needles
within the thyroid nodule.
IMPRESSION: Technically successful ultrasound guided fine needle aspiration of
indeterminate left mid thyroid nodule.

Read by Key, Veeh

## 2021-07-06 ENCOUNTER — Ambulatory Visit (INDEPENDENT_AMBULATORY_CARE_PROVIDER_SITE_OTHER): Payer: Medicare Other | Admitting: *Deleted

## 2021-07-06 DIAGNOSIS — I482 Chronic atrial fibrillation, unspecified: Secondary | ICD-10-CM | POA: Diagnosis not present

## 2021-07-06 DIAGNOSIS — Z5181 Encounter for therapeutic drug level monitoring: Secondary | ICD-10-CM | POA: Diagnosis not present

## 2021-07-06 LAB — POCT INR: INR: 2.2 (ref 2.0–3.0)

## 2021-07-06 NOTE — Patient Instructions (Signed)
Continue warfarin 1 tablet daily except 1 1/2 tablets on Tuesdays and Saturdays Recheck in 6 weeks 

## 2021-08-22 DIAGNOSIS — R5383 Other fatigue: Secondary | ICD-10-CM | POA: Diagnosis not present

## 2021-08-22 DIAGNOSIS — Z299 Encounter for prophylactic measures, unspecified: Secondary | ICD-10-CM | POA: Diagnosis not present

## 2021-08-22 DIAGNOSIS — I739 Peripheral vascular disease, unspecified: Secondary | ICD-10-CM | POA: Diagnosis not present

## 2021-08-22 DIAGNOSIS — Z79899 Other long term (current) drug therapy: Secondary | ICD-10-CM | POA: Diagnosis not present

## 2021-08-22 DIAGNOSIS — I4891 Unspecified atrial fibrillation: Secondary | ICD-10-CM | POA: Diagnosis not present

## 2021-08-22 DIAGNOSIS — I10 Essential (primary) hypertension: Secondary | ICD-10-CM | POA: Diagnosis not present

## 2021-09-12 DIAGNOSIS — R42 Dizziness and giddiness: Secondary | ICD-10-CM | POA: Diagnosis not present

## 2021-09-12 DIAGNOSIS — Z9181 History of falling: Secondary | ICD-10-CM | POA: Diagnosis not present

## 2021-09-12 DIAGNOSIS — M6281 Muscle weakness (generalized): Secondary | ICD-10-CM | POA: Diagnosis not present

## 2021-09-15 DIAGNOSIS — R42 Dizziness and giddiness: Secondary | ICD-10-CM | POA: Diagnosis not present

## 2021-09-15 DIAGNOSIS — Z9181 History of falling: Secondary | ICD-10-CM | POA: Diagnosis not present

## 2021-09-15 DIAGNOSIS — M6281 Muscle weakness (generalized): Secondary | ICD-10-CM | POA: Diagnosis not present

## 2021-09-19 ENCOUNTER — Other Ambulatory Visit: Payer: Self-pay | Admitting: Cardiology

## 2021-09-19 NOTE — Telephone Encounter (Signed)
Request for warfarin refill: ?Last INR was 2.2 on 07/06/21 ?Next INR on 09/21/21 ?LOV 02/13/21 ?Past due for MD appt.  Will schedule at coumadin visit on 09/21/21 ?Refill approved x 3 ? ?

## 2021-09-21 ENCOUNTER — Ambulatory Visit (INDEPENDENT_AMBULATORY_CARE_PROVIDER_SITE_OTHER): Payer: Medicare Other | Admitting: *Deleted

## 2021-09-21 DIAGNOSIS — Z5181 Encounter for therapeutic drug level monitoring: Secondary | ICD-10-CM | POA: Diagnosis not present

## 2021-09-21 DIAGNOSIS — I482 Chronic atrial fibrillation, unspecified: Secondary | ICD-10-CM

## 2021-09-21 DIAGNOSIS — M6281 Muscle weakness (generalized): Secondary | ICD-10-CM | POA: Diagnosis not present

## 2021-09-21 DIAGNOSIS — Z9181 History of falling: Secondary | ICD-10-CM | POA: Diagnosis not present

## 2021-09-21 DIAGNOSIS — R42 Dizziness and giddiness: Secondary | ICD-10-CM | POA: Diagnosis not present

## 2021-09-21 LAB — POCT INR: INR: 2.2 (ref 2.0–3.0)

## 2021-09-21 NOTE — Patient Instructions (Signed)
Continue warfarin 1 tablet daily except 1 1/2 tablets on Tuesdays and Saturdays Recheck in 6 weeks 

## 2021-09-22 DIAGNOSIS — M6281 Muscle weakness (generalized): Secondary | ICD-10-CM | POA: Diagnosis not present

## 2021-09-22 DIAGNOSIS — Z9181 History of falling: Secondary | ICD-10-CM | POA: Diagnosis not present

## 2021-09-22 DIAGNOSIS — R42 Dizziness and giddiness: Secondary | ICD-10-CM | POA: Diagnosis not present

## 2021-09-26 DIAGNOSIS — R42 Dizziness and giddiness: Secondary | ICD-10-CM | POA: Diagnosis not present

## 2021-09-26 DIAGNOSIS — Z9181 History of falling: Secondary | ICD-10-CM | POA: Diagnosis not present

## 2021-09-26 DIAGNOSIS — M6281 Muscle weakness (generalized): Secondary | ICD-10-CM | POA: Diagnosis not present

## 2021-09-28 DIAGNOSIS — Z9181 History of falling: Secondary | ICD-10-CM | POA: Diagnosis not present

## 2021-09-28 DIAGNOSIS — M6281 Muscle weakness (generalized): Secondary | ICD-10-CM | POA: Diagnosis not present

## 2021-09-28 DIAGNOSIS — R42 Dizziness and giddiness: Secondary | ICD-10-CM | POA: Diagnosis not present

## 2021-10-03 DIAGNOSIS — R42 Dizziness and giddiness: Secondary | ICD-10-CM | POA: Diagnosis not present

## 2021-10-03 DIAGNOSIS — M6281 Muscle weakness (generalized): Secondary | ICD-10-CM | POA: Diagnosis not present

## 2021-10-03 DIAGNOSIS — Z9181 History of falling: Secondary | ICD-10-CM | POA: Diagnosis not present

## 2021-10-05 DIAGNOSIS — Z9181 History of falling: Secondary | ICD-10-CM | POA: Diagnosis not present

## 2021-10-05 DIAGNOSIS — M6281 Muscle weakness (generalized): Secondary | ICD-10-CM | POA: Diagnosis not present

## 2021-10-05 DIAGNOSIS — R42 Dizziness and giddiness: Secondary | ICD-10-CM | POA: Diagnosis not present

## 2021-10-10 DIAGNOSIS — M6281 Muscle weakness (generalized): Secondary | ICD-10-CM | POA: Diagnosis not present

## 2021-10-10 DIAGNOSIS — Z9181 History of falling: Secondary | ICD-10-CM | POA: Diagnosis not present

## 2021-10-10 DIAGNOSIS — R42 Dizziness and giddiness: Secondary | ICD-10-CM | POA: Diagnosis not present

## 2021-10-16 DIAGNOSIS — M6281 Muscle weakness (generalized): Secondary | ICD-10-CM | POA: Diagnosis not present

## 2021-10-16 DIAGNOSIS — Z9181 History of falling: Secondary | ICD-10-CM | POA: Diagnosis not present

## 2021-10-16 DIAGNOSIS — R42 Dizziness and giddiness: Secondary | ICD-10-CM | POA: Diagnosis not present

## 2021-10-20 DIAGNOSIS — R55 Syncope and collapse: Secondary | ICD-10-CM | POA: Diagnosis not present

## 2021-10-20 DIAGNOSIS — Z882 Allergy status to sulfonamides status: Secondary | ICD-10-CM | POA: Diagnosis not present

## 2021-10-20 DIAGNOSIS — R531 Weakness: Secondary | ICD-10-CM | POA: Diagnosis not present

## 2021-10-20 DIAGNOSIS — I4891 Unspecified atrial fibrillation: Secondary | ICD-10-CM | POA: Diagnosis not present

## 2021-10-20 DIAGNOSIS — I10 Essential (primary) hypertension: Secondary | ICD-10-CM | POA: Diagnosis not present

## 2021-10-20 DIAGNOSIS — E860 Dehydration: Secondary | ICD-10-CM | POA: Diagnosis not present

## 2021-10-20 DIAGNOSIS — I6381 Other cerebral infarction due to occlusion or stenosis of small artery: Secondary | ICD-10-CM | POA: Diagnosis not present

## 2021-10-20 DIAGNOSIS — R9431 Abnormal electrocardiogram [ECG] [EKG]: Secondary | ICD-10-CM | POA: Diagnosis not present

## 2021-10-20 DIAGNOSIS — Z79899 Other long term (current) drug therapy: Secondary | ICD-10-CM | POA: Diagnosis not present

## 2021-10-20 DIAGNOSIS — Z880 Allergy status to penicillin: Secondary | ICD-10-CM | POA: Diagnosis not present

## 2021-10-24 DIAGNOSIS — Z9181 History of falling: Secondary | ICD-10-CM | POA: Diagnosis not present

## 2021-10-24 DIAGNOSIS — R42 Dizziness and giddiness: Secondary | ICD-10-CM | POA: Diagnosis not present

## 2021-10-24 DIAGNOSIS — M6281 Muscle weakness (generalized): Secondary | ICD-10-CM | POA: Diagnosis not present

## 2021-10-26 DIAGNOSIS — I10 Essential (primary) hypertension: Secondary | ICD-10-CM | POA: Diagnosis not present

## 2021-10-26 DIAGNOSIS — I739 Peripheral vascular disease, unspecified: Secondary | ICD-10-CM | POA: Diagnosis not present

## 2021-10-26 DIAGNOSIS — Z299 Encounter for prophylactic measures, unspecified: Secondary | ICD-10-CM | POA: Diagnosis not present

## 2021-10-26 DIAGNOSIS — I4891 Unspecified atrial fibrillation: Secondary | ICD-10-CM | POA: Diagnosis not present

## 2021-10-26 DIAGNOSIS — I700 Atherosclerosis of aorta: Secondary | ICD-10-CM | POA: Diagnosis not present

## 2021-11-23 ENCOUNTER — Other Ambulatory Visit: Payer: Self-pay | Admitting: Cardiology

## 2021-12-14 DIAGNOSIS — R55 Syncope and collapse: Secondary | ICD-10-CM | POA: Diagnosis not present

## 2021-12-14 DIAGNOSIS — E559 Vitamin D deficiency, unspecified: Secondary | ICD-10-CM | POA: Diagnosis not present

## 2021-12-14 DIAGNOSIS — I10 Essential (primary) hypertension: Secondary | ICD-10-CM | POA: Diagnosis not present

## 2021-12-14 DIAGNOSIS — I4891 Unspecified atrial fibrillation: Secondary | ICD-10-CM | POA: Diagnosis not present

## 2021-12-14 DIAGNOSIS — Z299 Encounter for prophylactic measures, unspecified: Secondary | ICD-10-CM | POA: Diagnosis not present

## 2021-12-20 ENCOUNTER — Other Ambulatory Visit: Payer: Self-pay | Admitting: Cardiology

## 2021-12-20 NOTE — Telephone Encounter (Signed)
Prescription refill request received for warfarin Lov: 02/13/21 Leonides Sake)  Next INR check: OVERDUE Warfarin tablet strength: '5MG'$   Called and made pt aware that he was overdue for INR check. Pt stated he had enough Warfarin until next week. Schedule anticoagulation appt for 12/25/21. Pt verbalized understanding.

## 2021-12-25 ENCOUNTER — Telehealth: Payer: Self-pay | Admitting: *Deleted

## 2021-12-25 NOTE — Telephone Encounter (Signed)
Will follow up with patient next week to see if  he is feeling better.  If not will discuss options (Elquis/ASA) with Dr Harl Bowie.

## 2021-12-25 NOTE — Telephone Encounter (Signed)
[name] missed his CCR today. Spoke with him he said he has no transportation at this time and can hardly walk at present time.

## 2021-12-27 ENCOUNTER — Other Ambulatory Visit: Payer: Self-pay | Admitting: Cardiology

## 2021-12-29 ENCOUNTER — Encounter: Payer: Self-pay | Admitting: *Deleted

## 2022-01-05 DIAGNOSIS — R3911 Hesitancy of micturition: Secondary | ICD-10-CM | POA: Diagnosis not present

## 2022-01-05 DIAGNOSIS — R350 Frequency of micturition: Secondary | ICD-10-CM | POA: Diagnosis not present

## 2022-01-05 DIAGNOSIS — R3912 Poor urinary stream: Secondary | ICD-10-CM | POA: Diagnosis not present

## 2022-01-11 DIAGNOSIS — R55 Syncope and collapse: Secondary | ICD-10-CM | POA: Diagnosis not present

## 2022-01-15 ENCOUNTER — Ambulatory Visit (INDEPENDENT_AMBULATORY_CARE_PROVIDER_SITE_OTHER): Payer: Medicare Other | Admitting: *Deleted

## 2022-01-15 DIAGNOSIS — I482 Chronic atrial fibrillation, unspecified: Secondary | ICD-10-CM | POA: Diagnosis not present

## 2022-01-15 DIAGNOSIS — Z5181 Encounter for therapeutic drug level monitoring: Secondary | ICD-10-CM | POA: Diagnosis not present

## 2022-01-15 LAB — POCT INR: INR: 2.9 (ref 2.0–3.0)

## 2022-01-15 NOTE — Patient Instructions (Signed)
Continue warfarin 1 tablet daily except 1 1/2 tablets on Tuesdays and Saturdays Recheck in 6 weeks

## 2022-01-18 ENCOUNTER — Other Ambulatory Visit: Payer: Self-pay | Admitting: Cardiology

## 2022-01-18 DIAGNOSIS — R55 Syncope and collapse: Secondary | ICD-10-CM | POA: Diagnosis not present

## 2022-02-02 DIAGNOSIS — Z299 Encounter for prophylactic measures, unspecified: Secondary | ICD-10-CM | POA: Diagnosis not present

## 2022-02-02 DIAGNOSIS — I4891 Unspecified atrial fibrillation: Secondary | ICD-10-CM | POA: Diagnosis not present

## 2022-02-02 DIAGNOSIS — I472 Ventricular tachycardia, unspecified: Secondary | ICD-10-CM | POA: Diagnosis not present

## 2022-02-02 DIAGNOSIS — I10 Essential (primary) hypertension: Secondary | ICD-10-CM | POA: Diagnosis not present

## 2022-02-13 ENCOUNTER — Telehealth: Payer: Self-pay | Admitting: Cardiology

## 2022-02-13 NOTE — Telephone Encounter (Signed)
Pt's PCP sent over zio monitor results -   Routed to Dr. Percival Spanish

## 2022-02-19 ENCOUNTER — Other Ambulatory Visit: Payer: Self-pay | Admitting: Cardiology

## 2022-02-19 NOTE — Telephone Encounter (Signed)
Spoke with patient regarding apointment with Dr. Percival Spanish. He wanted me to call Milbert Coulter [phone] to schedule. Left message.

## 2022-02-20 ENCOUNTER — Telehealth: Payer: Self-pay

## 2022-02-20 NOTE — Telephone Encounter (Signed)
Follow up scheduled

## 2022-02-20 NOTE — Telephone Encounter (Signed)
Spoke to pt's son-in-law and scheduled appointment for 9/8 at 4:20 pm

## 2022-02-20 NOTE — Telephone Encounter (Signed)
For [employer] LMTCB. Spoke with patient to scheulde him with Dr. Percival Spanish. He wanted me to  contact Milbert Coulter. I explained that Mr. Lorenza Chick is not returning my calls, could I schedule patient and he inform Mr. Lorenza Chick. Patient wanted me to speak with Mr. Lorenza Chick to schedule appointment because he drives patient. He gave me another number for Mr. Lorenza Chick: [phone] (left message).

## 2022-02-20 NOTE — Telephone Encounter (Signed)
LMTCB

## 2022-02-20 NOTE — Telephone Encounter (Signed)
[name] returning call. Requesting call back.

## 2022-02-22 DIAGNOSIS — I959 Hypotension, unspecified: Secondary | ICD-10-CM | POA: Diagnosis not present

## 2022-02-22 DIAGNOSIS — R42 Dizziness and giddiness: Secondary | ICD-10-CM | POA: Diagnosis not present

## 2022-02-27 DIAGNOSIS — J1282 Pneumonia due to coronavirus disease 2019: Secondary | ICD-10-CM | POA: Diagnosis not present

## 2022-02-27 DIAGNOSIS — R059 Cough, unspecified: Secondary | ICD-10-CM | POA: Diagnosis not present

## 2022-02-27 DIAGNOSIS — U071 COVID-19: Secondary | ICD-10-CM | POA: Diagnosis not present

## 2022-02-27 DIAGNOSIS — M6281 Muscle weakness (generalized): Secondary | ICD-10-CM | POA: Diagnosis not present

## 2022-02-27 DIAGNOSIS — R531 Weakness: Secondary | ICD-10-CM | POA: Diagnosis not present

## 2022-02-27 DIAGNOSIS — I4891 Unspecified atrial fibrillation: Secondary | ICD-10-CM | POA: Diagnosis not present

## 2022-02-27 DIAGNOSIS — S2242XA Multiple fractures of ribs, left side, initial encounter for closed fracture: Secondary | ICD-10-CM | POA: Diagnosis not present

## 2022-02-27 DIAGNOSIS — W19XXXD Unspecified fall, subsequent encounter: Secondary | ICD-10-CM | POA: Diagnosis not present

## 2022-02-27 DIAGNOSIS — E860 Dehydration: Secondary | ICD-10-CM | POA: Diagnosis not present

## 2022-02-27 DIAGNOSIS — R296 Repeated falls: Secondary | ICD-10-CM | POA: Diagnosis not present

## 2022-02-27 DIAGNOSIS — R54 Age-related physical debility: Secondary | ICD-10-CM | POA: Diagnosis not present

## 2022-02-27 DIAGNOSIS — I4892 Unspecified atrial flutter: Secondary | ICD-10-CM | POA: Diagnosis not present

## 2022-02-27 DIAGNOSIS — R6520 Severe sepsis without septic shock: Secondary | ICD-10-CM | POA: Diagnosis not present

## 2022-02-27 DIAGNOSIS — Z79899 Other long term (current) drug therapy: Secondary | ICD-10-CM | POA: Diagnosis not present

## 2022-02-27 DIAGNOSIS — I10 Essential (primary) hypertension: Secondary | ICD-10-CM | POA: Diagnosis not present

## 2022-02-27 DIAGNOSIS — I480 Paroxysmal atrial fibrillation: Secondary | ICD-10-CM | POA: Diagnosis not present

## 2022-02-27 DIAGNOSIS — N202 Calculus of kidney with calculus of ureter: Secondary | ICD-10-CM | POA: Diagnosis not present

## 2022-02-27 DIAGNOSIS — Z880 Allergy status to penicillin: Secondary | ICD-10-CM | POA: Diagnosis not present

## 2022-02-27 DIAGNOSIS — I160 Hypertensive urgency: Secondary | ICD-10-CM | POA: Diagnosis not present

## 2022-02-27 DIAGNOSIS — N201 Calculus of ureter: Secondary | ICD-10-CM | POA: Diagnosis not present

## 2022-02-27 DIAGNOSIS — Z9981 Dependence on supplemental oxygen: Secondary | ICD-10-CM | POA: Diagnosis not present

## 2022-02-27 DIAGNOSIS — R2689 Other abnormalities of gait and mobility: Secondary | ICD-10-CM | POA: Diagnosis not present

## 2022-02-27 DIAGNOSIS — Z7901 Long term (current) use of anticoagulants: Secondary | ICD-10-CM | POA: Diagnosis not present

## 2022-02-27 DIAGNOSIS — E785 Hyperlipidemia, unspecified: Secondary | ICD-10-CM | POA: Diagnosis not present

## 2022-02-27 DIAGNOSIS — Z882 Allergy status to sulfonamides status: Secondary | ICD-10-CM | POA: Diagnosis not present

## 2022-02-27 DIAGNOSIS — Z792 Long term (current) use of antibiotics: Secondary | ICD-10-CM | POA: Diagnosis not present

## 2022-02-27 DIAGNOSIS — J984 Other disorders of lung: Secondary | ICD-10-CM | POA: Diagnosis not present

## 2022-02-27 DIAGNOSIS — J9601 Acute respiratory failure with hypoxia: Secondary | ICD-10-CM | POA: Diagnosis not present

## 2022-02-27 DIAGNOSIS — Z743 Need for continuous supervision: Secondary | ICD-10-CM | POA: Diagnosis not present

## 2022-02-27 DIAGNOSIS — A4189 Other specified sepsis: Secondary | ICD-10-CM | POA: Diagnosis not present

## 2022-02-27 DIAGNOSIS — N179 Acute kidney failure, unspecified: Secondary | ICD-10-CM | POA: Diagnosis not present

## 2022-02-27 DIAGNOSIS — S2242XD Multiple fractures of ribs, left side, subsequent encounter for fracture with routine healing: Secondary | ICD-10-CM | POA: Diagnosis not present

## 2022-03-02 ENCOUNTER — Ambulatory Visit: Payer: Medicare Other | Admitting: Cardiology

## 2022-03-10 DIAGNOSIS — N179 Acute kidney failure, unspecified: Secondary | ICD-10-CM | POA: Diagnosis not present

## 2022-03-10 DIAGNOSIS — R54 Age-related physical debility: Secondary | ICD-10-CM | POA: Diagnosis not present

## 2022-03-10 DIAGNOSIS — Z7901 Long term (current) use of anticoagulants: Secondary | ICD-10-CM | POA: Diagnosis not present

## 2022-03-10 DIAGNOSIS — M6281 Muscle weakness (generalized): Secondary | ICD-10-CM | POA: Diagnosis not present

## 2022-03-10 DIAGNOSIS — J9601 Acute respiratory failure with hypoxia: Secondary | ICD-10-CM | POA: Diagnosis not present

## 2022-03-10 DIAGNOSIS — S2242XD Multiple fractures of ribs, left side, subsequent encounter for fracture with routine healing: Secondary | ICD-10-CM | POA: Diagnosis not present

## 2022-03-10 DIAGNOSIS — U071 COVID-19: Secondary | ICD-10-CM | POA: Diagnosis not present

## 2022-03-10 DIAGNOSIS — R2689 Other abnormalities of gait and mobility: Secondary | ICD-10-CM | POA: Diagnosis not present

## 2022-03-10 DIAGNOSIS — R296 Repeated falls: Secondary | ICD-10-CM | POA: Diagnosis not present

## 2022-03-10 DIAGNOSIS — I160 Hypertensive urgency: Secondary | ICD-10-CM | POA: Diagnosis not present

## 2022-03-10 DIAGNOSIS — I4891 Unspecified atrial fibrillation: Secondary | ICD-10-CM | POA: Diagnosis not present

## 2022-03-10 DIAGNOSIS — I10 Essential (primary) hypertension: Secondary | ICD-10-CM | POA: Diagnosis not present

## 2022-03-10 DIAGNOSIS — N201 Calculus of ureter: Secondary | ICD-10-CM | POA: Diagnosis not present

## 2022-03-16 ENCOUNTER — Ambulatory Visit: Payer: Medicare Other | Admitting: Urology

## 2022-03-29 ENCOUNTER — Ambulatory Visit: Payer: Medicare Other | Attending: Cardiology | Admitting: *Deleted

## 2022-03-29 ENCOUNTER — Encounter: Payer: Self-pay | Admitting: *Deleted

## 2022-03-29 DIAGNOSIS — I482 Chronic atrial fibrillation, unspecified: Secondary | ICD-10-CM | POA: Diagnosis not present

## 2022-03-29 DIAGNOSIS — Z5181 Encounter for therapeutic drug level monitoring: Secondary | ICD-10-CM

## 2022-03-29 DIAGNOSIS — I10 Essential (primary) hypertension: Secondary | ICD-10-CM | POA: Diagnosis not present

## 2022-03-29 DIAGNOSIS — M179 Osteoarthritis of knee, unspecified: Secondary | ICD-10-CM | POA: Diagnosis not present

## 2022-03-29 DIAGNOSIS — Z09 Encounter for follow-up examination after completed treatment for conditions other than malignant neoplasm: Secondary | ICD-10-CM | POA: Diagnosis not present

## 2022-03-29 DIAGNOSIS — R296 Repeated falls: Secondary | ICD-10-CM | POA: Diagnosis not present

## 2022-03-29 DIAGNOSIS — I4891 Unspecified atrial fibrillation: Secondary | ICD-10-CM | POA: Diagnosis not present

## 2022-03-29 LAB — POCT INR: INR: 1.5 — AB (ref 2.0–3.0)

## 2022-03-29 NOTE — Progress Notes (Signed)
This encounter was created in error - please disregard.  This encounter was created in error - please disregard.

## 2022-03-29 NOTE — Patient Instructions (Signed)
Take warfarin 1 1/2 tablets tonight and tomorrow night then resume 1 tablet daily except 1 1/2 tablets on Tuesdays and Saturdays Recheck in 10 days

## 2022-04-03 ENCOUNTER — Telehealth: Payer: Self-pay | Admitting: Cardiology

## 2022-04-03 NOTE — Telephone Encounter (Signed)
Pt's POA Milbert Coulter came in and needs clarification on his Metoprolol because he is going into an assisted living today.    States that he's having low BP and having falls, they think it's coming from where he doesn't need to be on this medication   Pt has an apt to see B. Strader on 10/25  Please [phone]

## 2022-04-03 NOTE — Telephone Encounter (Signed)
POA, Milbert Coulter reports while patient was in the hospital and rehab at [facility], his average BP was around 100/60 & unsure what his HR measured. Has not checked BP or HR at home due to not having a monitor. Says patient has been having dizzy spells daily for the past 2 months and was fainting at least 2 times per week. Also reports fatigue daily. Reports eating and drinking okay. Reports patient was discharged home from rehab on 03/25/22. Reports patient is being admitted to [facility] assisted living today. Confirmed that patient takes toprol xl 12.5 mg daily. Advised that he can hold toprol for now and keep follow up appointment on 04/18/22. Advised that clinical staff at [facility] will be monitoring his BP & HR and can report to facility provider if needed. Advised to return to ED for worsening symptoms. Verbalized understanding of plan.

## 2022-04-03 NOTE — Telephone Encounter (Signed)
Advised that last office note and last prescription will be faxed to [facility]. Also, will request blood pressure and heart rate is checked daily. Verbalized understanding.

## 2022-04-03 NOTE — Telephone Encounter (Signed)
[name] came back to the office stating that Nanine Means is going to need something showing the medication change for the Metoprolol it's going to have to be shown on the [facility] form for the facility.  If it needs to be discontinued please put that in the note.  It can be faxed to [phone]

## 2022-04-04 DIAGNOSIS — I10 Essential (primary) hypertension: Secondary | ICD-10-CM | POA: Diagnosis not present

## 2022-04-04 DIAGNOSIS — D6869 Other thrombophilia: Secondary | ICD-10-CM | POA: Diagnosis not present

## 2022-04-04 DIAGNOSIS — I739 Peripheral vascular disease, unspecified: Secondary | ICD-10-CM | POA: Diagnosis not present

## 2022-04-04 DIAGNOSIS — I4891 Unspecified atrial fibrillation: Secondary | ICD-10-CM | POA: Diagnosis not present

## 2022-04-04 DIAGNOSIS — Z299 Encounter for prophylactic measures, unspecified: Secondary | ICD-10-CM | POA: Diagnosis not present

## 2022-04-09 ENCOUNTER — Ambulatory Visit: Payer: Medicare Other | Attending: Cardiology | Admitting: *Deleted

## 2022-04-09 DIAGNOSIS — Z5181 Encounter for therapeutic drug level monitoring: Secondary | ICD-10-CM

## 2022-04-09 DIAGNOSIS — I482 Chronic atrial fibrillation, unspecified: Secondary | ICD-10-CM

## 2022-04-09 LAB — POCT INR: INR: 1.2 — AB (ref 2.0–3.0)

## 2022-04-09 NOTE — Patient Instructions (Signed)
Take warfarin 2 tablets tonight and tomorrow night then increase dose to 1 tablet daily except 1 1/2 tablets on Mondays, Wednesdays and Fridays Recheck in 10 days

## 2022-04-10 DIAGNOSIS — R42 Dizziness and giddiness: Secondary | ICD-10-CM | POA: Diagnosis not present

## 2022-04-10 DIAGNOSIS — R404 Transient alteration of awareness: Secondary | ICD-10-CM | POA: Diagnosis not present

## 2022-04-10 DIAGNOSIS — Z743 Need for continuous supervision: Secondary | ICD-10-CM | POA: Diagnosis not present

## 2022-04-10 DIAGNOSIS — S0990XA Unspecified injury of head, initial encounter: Secondary | ICD-10-CM | POA: Diagnosis not present

## 2022-04-10 DIAGNOSIS — R61 Generalized hyperhidrosis: Secondary | ICD-10-CM | POA: Diagnosis not present

## 2022-04-10 DIAGNOSIS — G319 Degenerative disease of nervous system, unspecified: Secondary | ICD-10-CM | POA: Diagnosis not present

## 2022-04-10 DIAGNOSIS — R55 Syncope and collapse: Secondary | ICD-10-CM | POA: Diagnosis not present

## 2022-04-10 DIAGNOSIS — E860 Dehydration: Secondary | ICD-10-CM | POA: Diagnosis not present

## 2022-04-10 DIAGNOSIS — R6889 Other general symptoms and signs: Secondary | ICD-10-CM | POA: Diagnosis not present

## 2022-04-10 DIAGNOSIS — Z880 Allergy status to penicillin: Secondary | ICD-10-CM | POA: Diagnosis not present

## 2022-04-10 DIAGNOSIS — W1839XA Other fall on same level, initial encounter: Secondary | ICD-10-CM | POA: Diagnosis not present

## 2022-04-10 DIAGNOSIS — I10 Essential (primary) hypertension: Secondary | ICD-10-CM | POA: Diagnosis not present

## 2022-04-10 DIAGNOSIS — Z7901 Long term (current) use of anticoagulants: Secondary | ICD-10-CM | POA: Diagnosis not present

## 2022-04-10 DIAGNOSIS — Z79899 Other long term (current) drug therapy: Secondary | ICD-10-CM | POA: Diagnosis not present

## 2022-04-10 DIAGNOSIS — I639 Cerebral infarction, unspecified: Secondary | ICD-10-CM | POA: Diagnosis not present

## 2022-04-10 DIAGNOSIS — Z882 Allergy status to sulfonamides status: Secondary | ICD-10-CM | POA: Diagnosis not present

## 2022-04-10 DIAGNOSIS — Z881 Allergy status to other antibiotic agents status: Secondary | ICD-10-CM | POA: Diagnosis not present

## 2022-04-10 DIAGNOSIS — I4891 Unspecified atrial fibrillation: Secondary | ICD-10-CM | POA: Diagnosis not present

## 2022-04-10 DIAGNOSIS — Z043 Encounter for examination and observation following other accident: Secondary | ICD-10-CM | POA: Diagnosis not present

## 2022-04-10 LAB — PROTIME-INR: INR: 1.19 (ref 0.80–1.20)

## 2022-04-11 ENCOUNTER — Telehealth: Payer: Self-pay | Admitting: *Deleted

## 2022-04-11 ENCOUNTER — Ambulatory Visit (INDEPENDENT_AMBULATORY_CARE_PROVIDER_SITE_OTHER): Payer: Medicare Other | Admitting: *Deleted

## 2022-04-11 DIAGNOSIS — Z5181 Encounter for therapeutic drug level monitoring: Secondary | ICD-10-CM | POA: Diagnosis not present

## 2022-04-11 DIAGNOSIS — I482 Chronic atrial fibrillation, unspecified: Secondary | ICD-10-CM

## 2022-04-11 NOTE — Patient Instructions (Signed)
Pt in at [facility] now.  Pt was in ED at [facility] 04/10/22 for syncope.  CT Head negative.  INR was 1.19. Take warfarin 2 tablets tonight then take 1 1/2 tablets daily and recheck INR on 04/16/22 @ 11:45 am.   Order given to [place] at [facility] and order faxed.

## 2022-04-11 NOTE — Telephone Encounter (Signed)
Done.  See coumadin note from today.

## 2022-04-11 NOTE — Telephone Encounter (Signed)
I have [name] on the line requesting to speak with you regarding patients INR

## 2022-04-12 ENCOUNTER — Telehealth: Payer: Self-pay | Admitting: *Deleted

## 2022-04-12 DIAGNOSIS — I700 Atherosclerosis of aorta: Secondary | ICD-10-CM | POA: Diagnosis not present

## 2022-04-12 DIAGNOSIS — I270 Primary pulmonary hypertension: Secondary | ICD-10-CM | POA: Diagnosis not present

## 2022-04-12 DIAGNOSIS — D692 Other nonthrombocytopenic purpura: Secondary | ICD-10-CM | POA: Diagnosis not present

## 2022-04-12 DIAGNOSIS — T2000XA Burn of unspecified degree of head, face, and neck, unspecified site, initial encounter: Secondary | ICD-10-CM | POA: Diagnosis not present

## 2022-04-12 DIAGNOSIS — Z299 Encounter for prophylactic measures, unspecified: Secondary | ICD-10-CM | POA: Diagnosis not present

## 2022-04-12 NOTE — Telephone Encounter (Signed)
[name] at [facility] is following up. She states they are needing the MG listed on the order. Please resend.

## 2022-04-12 NOTE — Telephone Encounter (Signed)
Called and spoke with [place].  Told her corrected order has already been sent.

## 2022-04-12 NOTE — Telephone Encounter (Signed)
[name] is calling to talk with [name] about patient's PTI Coumadin Order

## 2022-04-12 NOTE — Telephone Encounter (Signed)
Called and spoke to Therapist, sports at [facility].  They need order from yesterday signed by Dr Harl Bowie,  Order resent.

## 2022-04-16 ENCOUNTER — Ambulatory Visit: Payer: Medicare Other | Attending: Cardiology | Admitting: *Deleted

## 2022-04-16 ENCOUNTER — Telehealth: Payer: Self-pay | Admitting: Internal Medicine

## 2022-04-16 DIAGNOSIS — I482 Chronic atrial fibrillation, unspecified: Secondary | ICD-10-CM | POA: Diagnosis not present

## 2022-04-16 DIAGNOSIS — Z5181 Encounter for therapeutic drug level monitoring: Secondary | ICD-10-CM | POA: Diagnosis not present

## 2022-04-16 LAB — POCT INR: INR: 2.3 (ref 2.0–3.0)

## 2022-04-16 NOTE — Patient Instructions (Signed)
Pt in at [facility] now.  Start warfarin 7.'5mg'$  daily except '5mg'$  on Tuesdays and Fridays Recheck INR on 04/30/22 @ 10:45 am. Written Order sent to [facility]

## 2022-04-16 NOTE — Telephone Encounter (Signed)
[name] is calling requesting the doctor call him and put it on speaker phone during the patient's upcoming appointment. The patient does not have a cell phone to be able to call himself and [name] lives over an hour away from the patient, but wants to make sure the patient's issues are addressed. Requesting a callback to confirm this is possible.

## 2022-04-17 NOTE — Telephone Encounter (Signed)
[name] returning call, phone number was put in appt notes as well

## 2022-04-17 NOTE — Telephone Encounter (Signed)
Left message for [name] to call office back regarding pt's appointment.

## 2022-04-17 NOTE — Telephone Encounter (Signed)
Care giver notified and verbalized understanding

## 2022-04-18 ENCOUNTER — Ambulatory Visit: Payer: Medicare Other | Admitting: Student

## 2022-04-20 DIAGNOSIS — Z7901 Long term (current) use of anticoagulants: Secondary | ICD-10-CM | POA: Diagnosis not present

## 2022-04-20 DIAGNOSIS — E785 Hyperlipidemia, unspecified: Secondary | ICD-10-CM | POA: Diagnosis not present

## 2022-04-20 DIAGNOSIS — I472 Ventricular tachycardia, unspecified: Secondary | ICD-10-CM | POA: Diagnosis not present

## 2022-04-20 DIAGNOSIS — I270 Primary pulmonary hypertension: Secondary | ICD-10-CM | POA: Diagnosis not present

## 2022-04-20 DIAGNOSIS — D692 Other nonthrombocytopenic purpura: Secondary | ICD-10-CM | POA: Diagnosis not present

## 2022-04-20 DIAGNOSIS — I4891 Unspecified atrial fibrillation: Secondary | ICD-10-CM | POA: Diagnosis not present

## 2022-04-20 DIAGNOSIS — I7121 Aneurysm of the ascending aorta, without rupture: Secondary | ICD-10-CM | POA: Diagnosis not present

## 2022-04-20 DIAGNOSIS — I700 Atherosclerosis of aorta: Secondary | ICD-10-CM | POA: Diagnosis not present

## 2022-04-20 DIAGNOSIS — I4892 Unspecified atrial flutter: Secondary | ICD-10-CM | POA: Diagnosis not present

## 2022-04-20 DIAGNOSIS — Z9181 History of falling: Secondary | ICD-10-CM | POA: Diagnosis not present

## 2022-04-20 DIAGNOSIS — M179 Osteoarthritis of knee, unspecified: Secondary | ICD-10-CM | POA: Diagnosis not present

## 2022-04-20 DIAGNOSIS — Z8616 Personal history of COVID-19: Secondary | ICD-10-CM | POA: Diagnosis not present

## 2022-04-20 DIAGNOSIS — D6869 Other thrombophilia: Secondary | ICD-10-CM | POA: Diagnosis not present

## 2022-04-20 DIAGNOSIS — D696 Thrombocytopenia, unspecified: Secondary | ICD-10-CM | POA: Diagnosis not present

## 2022-04-20 DIAGNOSIS — I10 Essential (primary) hypertension: Secondary | ICD-10-CM | POA: Diagnosis not present

## 2022-04-20 DIAGNOSIS — I739 Peripheral vascular disease, unspecified: Secondary | ICD-10-CM | POA: Diagnosis not present

## 2022-04-23 ENCOUNTER — Encounter: Payer: Self-pay | Admitting: Internal Medicine

## 2022-04-23 ENCOUNTER — Ambulatory Visit: Payer: Medicare Other | Attending: Student | Admitting: Internal Medicine

## 2022-04-23 VITALS — BP 102/63 | HR 83 | Ht 71.0 in | Wt 195.2 lb

## 2022-04-23 DIAGNOSIS — I951 Orthostatic hypotension: Secondary | ICD-10-CM | POA: Diagnosis not present

## 2022-04-23 DIAGNOSIS — I482 Chronic atrial fibrillation, unspecified: Secondary | ICD-10-CM | POA: Diagnosis not present

## 2022-04-23 NOTE — Addendum Note (Signed)
Addended by: Merlene Laughter on: 04/23/2022 04:53 PM   Modules accepted: Orders

## 2022-04-23 NOTE — Patient Instructions (Addendum)
Medication Instructions:  Your physician recommends that you continue on your current medications as directed. Please refer to the Current Medication list given to you today.  Labwork: none  Testing/Procedures: none  Follow-Up: Your physician recommends that you schedule a follow-up appointment in: 6 weeks  Any Other Special Instructions Will Be Listed Below (If Applicable).  If you need a refill on your cardiac medications before your next appointment, please call your pharmacy.

## 2022-04-23 NOTE — Progress Notes (Signed)
Cardiology Office Note  Date: 04/23/2022   ID: [name], DOB 1934-03-18, MRN [id number]  PCP:  Glenda Chroman, MD  Cardiologist:  Chalmers Guest, MD Electrophysiologist:  None   Reason for Office Visit: Follow-up of atrial flutter   History of Present Illness: [name] is a 88 y.o. male known to have atrial flutter s/p radiofrequency catheter ablation in 7619 complicated by recurrence of atypical atrial flutter requiring DCCV and amiodarone, HF improved EF (prior EF 30 to 35% believed to be tachycardia induced cardiomyopathy which was improved after cardioversion and recent echo showed normal LVEF), urinary incontinence presented to the cardiology clinic for follow-up visit.  Accompanied by son. Patient currently lives in assisted living facility in [place]. Patient reported having dizziness, fatigue and falls recently with no LOC. However he stated that he is not drinking water daily and that he is relying on coffee/juice for hydration.  Son believes that his father is not drinking enough water due to urinary incontinence. Otherwise denied any palpitations, syncope, LE swelling.  Currently on Coumadin for [facility] [place] and cannot afford DOACs.  Patient has thyroid mass and adrenal mass for which he needs to follow-up with endocrinology or PCP.  Past Medical History:  Diagnosis Date   Atrial flutter, paroxysmal ([place])    a. Hx of TEE with LAA clots. b. s/p ESP/RFCA of atrial flutter 08/2008. b. Recurrence of A-flutter after ablation/atypical, requiring DCCV and amiodaorone.   BPH (benign prostatic hypertrophy)    Cardiomyopathy ([place])    a. Prior EF 30-35%, felt tachycardia induced cardiomyopathyy. b. Improved after cardioversion and restoration of normal sinus rhythm - EF 65-70% by echo 10/13   Essential hypertension, benign    H/O amiodarone therapy    Used for atrial flutter after return of flutter after ablation   Shortness of breath for last year   when pt lies down     Urination, excessive at night    Warfarin anticoagulation    For atrial flutter    Past Surgical History:  Procedure Laterality Date   CYSTOSCOPY/RETROGRADE/URETEROSCOPY Right 08/26/2017   Procedure: CYSTOSCOPY/ RIGHT RETROGRADE PYELOGRAM/ RIGHT URETEROSCOPY WITH RIGHT URETERAL STENT PLACEMENT;  Surgeon: Cleon Gustin, MD;  Location: [facility];  Service: Urology;  Laterality: Right;   FEMORAL HERNIA REPAIR     x2   TOTAL KNEE ARTHROPLASTY Right 2004   TRANSURETHRAL PROSTATECTOMY WITH GYRUS INSTRUMENTS N/A 03/12/2013   Procedure: TRANSURETHRAL PROSTATECTOMY WITH GYRUS INSTRUMENTS;  Surgeon: Ailene Rud, MD;  Location: [facility];  Service: Urology;  Laterality: N/A;   TURP VAPORIZATION     UMBILICAL HERNIA REPAIR      Current Outpatient Medications  Medication Sig Dispense Refill   alfuzosin (UROXATRAL) 10 MG 24 hr tablet Take 10 mg by mouth at bedtime.     atorvastatin (LIPITOR) 10 MG tablet Take 10 mg by mouth daily.      citalopram (CELEXA) 20 MG tablet Take 1 tablet by mouth daily.     famotidine (PEPCID) 20 MG tablet Take 1 tablet by mouth daily.     Flaxseed, Linseed, (FLAXSEED OIL) 1000 MG CAPS Take 1 capsule by mouth daily.     latanoprost (XALATAN) 0.005 % ophthalmic solution Place 1 drop into both eyes at bedtime.     metoprolol succinate (TOPROL-XL) 25 MG 24 hr tablet TAKE 1/2 TABLET BY MOUTH ONCE DAILY; SCHEDULE AN APPOINTMENT FOR FURTHER REFILLS; FINAL ATTEMPT. 30 tablet 3   Omega-3 Fatty Acids (FISH OIL) 1000 MG CAPS  Take 1,000 mg by mouth. OCCASIONALLY     warfarin (COUMADIN) 5 MG tablet TAKE ONE TABLET BY MOUTH DAILY EXCEPT TAKE 1 AND 1/2 TABLETS ON TUESDAY AND SATURDAY. 45 tablet 2   No current facility-administered medications for this visit.   Allergies:  Penicillins and Sulfonamide derivatives   Social History: The patient  reports that he has never smoked. He has never been exposed to tobacco smoke. He has never used smokeless tobacco. He reports that he  does not drink alcohol and does not use drugs.   Family History: The patient's family history includes Dementia in his father and mother; Diabetes in his mother and paternal grandmother.   ROS:  Please see the history of present illness. Otherwise, complete review of systems is positive for none.  All other systems are reviewed and negative.   Physical Exam: VS:  BP 102/63 (BP Location: Right Arm, Cuff Size: Normal)   Pulse 83   Ht '5\' 11"'$  (1.803 m)   Wt 195 lb 3.2 oz (88.5 kg)   SpO2 94%   BMI 27.22 kg/m , BMI Body mass index is 27.22 kg/m.  Wt Readings from Last 3 Encounters:  04/23/22 195 lb 3.2 oz (88.5 kg)  02/13/21 201 lb 9.6 oz (91.4 kg)  08/27/19 206 lb (93.4 kg)    General: Patient appears comfortable at rest. HEENT: Conjunctiva and lids normal, oropharynx clear with moist mucosa. Neck: Supple, no elevated JVP or carotid bruits, no thyromegaly. Lungs: Clear to auscultation, nonlabored breathing at rest. Cardiac: Regular rate and rhythm, no S3 or significant systolic murmur, no pericardial rub. Abdomen: Soft, nontender, no hepatomegaly, bowel sounds present, no guarding or rebound. Extremities: No pitting edema, distal pulses 2+. Skin: Warm and dry. Musculoskeletal: No kyphosis. Neuropsychiatric: Alert and oriented x3, affect grossly appropriate.  ECG:  An ECG dated 04/23/2022 was personally reviewed today and demonstrated:  Atrial data  Recent Labwork: No results found for requested labs within last 365 days.  No results found for: "CHOL", "TRIG", "HDL", "CHOLHDL", "VLDL", "LDLCALC", "LDLDIRECT"  Other Studies Reviewed Today: Echo in 2022  1. Left ventricular ejection fraction, by estimation, is 60 to 65%. The  left ventricle has normal function. The left ventricle has no regional  wall motion abnormalities. There is mild left ventricular hypertrophy.  Left ventricular diastolic parameters  are indeterminate.   2. Right ventricular systolic function is normal. The  right ventricular  size is normal. There is normal pulmonary artery systolic pressure. The  estimated right ventricular systolic pressure is 97.9 mmHg.   3. Left atrial size was moderately dilated.   4. Right atrial size was mildly dilated.   5. A small pericardial effusion is present. The pericardial effusion is  anterior to the right ventricle and lateral to the left ventricle. There  is no evidence of cardiac tamponade.  Assessment and Plan: Patient is a 88 year old M known to have atrial flutter s/p radiofrequency catheter ablation in 8921 complicated by recurrence of atypical atrial flutter requiring DCCV and amiodarone, HF improved EF (prior EF 30 to 35% believed to be tachycardia induced cardiomyopathy which was improved after cardioversion and recent echo showed normal LVEF), urinary incontinence presented to the cardiology clinic for follow-up visit.    #Longstanding persistent atrial flutter S/p ablation in 2010 c/w recurrent atrial flutter s/p DCCV Plan -Continue metoprolol succinate 12.5 mg once daily. -Continue Coumadin with goal INR between 2 and 3. -Orthostatic vitals are positive in the clinic today. Instructed patient to drink at least 2  L of water every day. If he still continues to have dizziness/fatigue after adequate hydration, we will stop metoprolol in the next visit.  #Orthostatic hypotension Plan -Orthostatic vitals are positive in the clinic today. Instructed patient to drink at least 2 L of water every day. If he still continues to have dizziness/fatigue after adequate hydration, we will stop metoprolol in the next visit.  #HF improved EF (30-35% to 60-65%) likely tachycardia induced cardiomyopathy which was improved after cardioversion of atrial flutter Plan -Continue rate control with metoprolol succinate 12.5 mg once daily.  Instructed patient to have adequate hydration as orthostatic vitals were positive in the clinic today.  I have spent a total of 35  minutes with patient reviewing chart , EKGs, labs and examining patient as well as establishing an assessment and plan that was discussed with the patient.  > 50% of time was spent in direct patient care.      Medication Adjustments/Labs and Tests Ordered: Current medicines are reviewed at length with the patient today.  Concerns regarding medicines are outlined above.   Tests Ordered: No orders of the defined types were placed in this encounter.   Medication Changes: No orders of the defined types were placed in this encounter.   Disposition:  Follow up  one months  Signed Jayce Kainz Fidel Levy, MD, 04/23/2022 4:31 PM    [facility], [place],  [address]

## 2022-04-26 DIAGNOSIS — I270 Primary pulmonary hypertension: Secondary | ICD-10-CM | POA: Diagnosis not present

## 2022-04-26 DIAGNOSIS — R42 Dizziness and giddiness: Secondary | ICD-10-CM | POA: Diagnosis not present

## 2022-04-26 DIAGNOSIS — Z299 Encounter for prophylactic measures, unspecified: Secondary | ICD-10-CM | POA: Diagnosis not present

## 2022-04-26 DIAGNOSIS — T2000XA Burn of unspecified degree of head, face, and neck, unspecified site, initial encounter: Secondary | ICD-10-CM | POA: Diagnosis not present

## 2022-04-26 DIAGNOSIS — W19XXXA Unspecified fall, initial encounter: Secondary | ICD-10-CM | POA: Diagnosis not present

## 2022-04-27 DIAGNOSIS — Z743 Need for continuous supervision: Secondary | ICD-10-CM | POA: Diagnosis not present

## 2022-04-27 DIAGNOSIS — I10 Essential (primary) hypertension: Secondary | ICD-10-CM | POA: Diagnosis not present

## 2022-04-27 DIAGNOSIS — D692 Other nonthrombocytopenic purpura: Secondary | ICD-10-CM | POA: Diagnosis not present

## 2022-04-27 DIAGNOSIS — I472 Ventricular tachycardia, unspecified: Secondary | ICD-10-CM | POA: Diagnosis not present

## 2022-04-27 DIAGNOSIS — D696 Thrombocytopenia, unspecified: Secondary | ICD-10-CM | POA: Diagnosis not present

## 2022-04-27 DIAGNOSIS — I4892 Unspecified atrial flutter: Secondary | ICD-10-CM | POA: Diagnosis not present

## 2022-04-27 DIAGNOSIS — M179 Osteoarthritis of knee, unspecified: Secondary | ICD-10-CM | POA: Diagnosis not present

## 2022-04-27 DIAGNOSIS — Z8616 Personal history of COVID-19: Secondary | ICD-10-CM | POA: Diagnosis not present

## 2022-04-27 DIAGNOSIS — R404 Transient alteration of awareness: Secondary | ICD-10-CM | POA: Diagnosis not present

## 2022-04-27 DIAGNOSIS — S0081XA Abrasion of other part of head, initial encounter: Secondary | ICD-10-CM | POA: Diagnosis not present

## 2022-04-27 DIAGNOSIS — I739 Peripheral vascular disease, unspecified: Secondary | ICD-10-CM | POA: Diagnosis not present

## 2022-04-27 DIAGNOSIS — Z79899 Other long term (current) drug therapy: Secondary | ICD-10-CM | POA: Diagnosis not present

## 2022-04-27 DIAGNOSIS — Z7901 Long term (current) use of anticoagulants: Secondary | ICD-10-CM | POA: Diagnosis not present

## 2022-04-27 DIAGNOSIS — I4891 Unspecified atrial fibrillation: Secondary | ICD-10-CM | POA: Diagnosis not present

## 2022-04-27 DIAGNOSIS — Z881 Allergy status to other antibiotic agents status: Secondary | ICD-10-CM | POA: Diagnosis not present

## 2022-04-27 DIAGNOSIS — D6869 Other thrombophilia: Secondary | ICD-10-CM | POA: Diagnosis not present

## 2022-04-27 DIAGNOSIS — W1839XA Other fall on same level, initial encounter: Secondary | ICD-10-CM | POA: Diagnosis not present

## 2022-04-27 DIAGNOSIS — I700 Atherosclerosis of aorta: Secondary | ICD-10-CM | POA: Diagnosis not present

## 2022-04-27 DIAGNOSIS — I270 Primary pulmonary hypertension: Secondary | ICD-10-CM | POA: Diagnosis not present

## 2022-04-27 DIAGNOSIS — M47812 Spondylosis without myelopathy or radiculopathy, cervical region: Secondary | ICD-10-CM | POA: Diagnosis not present

## 2022-04-27 DIAGNOSIS — R6889 Other general symptoms and signs: Secondary | ICD-10-CM | POA: Diagnosis not present

## 2022-04-27 DIAGNOSIS — E785 Hyperlipidemia, unspecified: Secondary | ICD-10-CM | POA: Diagnosis not present

## 2022-04-27 DIAGNOSIS — Z882 Allergy status to sulfonamides status: Secondary | ICD-10-CM | POA: Diagnosis not present

## 2022-04-27 DIAGNOSIS — I7121 Aneurysm of the ascending aorta, without rupture: Secondary | ICD-10-CM | POA: Diagnosis not present

## 2022-04-27 DIAGNOSIS — Z880 Allergy status to penicillin: Secondary | ICD-10-CM | POA: Diagnosis not present

## 2022-04-27 DIAGNOSIS — S299XXA Unspecified injury of thorax, initial encounter: Secondary | ICD-10-CM | POA: Diagnosis not present

## 2022-04-27 DIAGNOSIS — I639 Cerebral infarction, unspecified: Secondary | ICD-10-CM | POA: Diagnosis not present

## 2022-04-27 DIAGNOSIS — Z9181 History of falling: Secondary | ICD-10-CM | POA: Diagnosis not present

## 2022-04-27 DIAGNOSIS — E041 Nontoxic single thyroid nodule: Secondary | ICD-10-CM | POA: Diagnosis not present

## 2022-04-30 DIAGNOSIS — I7121 Aneurysm of the ascending aorta, without rupture: Secondary | ICD-10-CM | POA: Diagnosis not present

## 2022-04-30 DIAGNOSIS — I10 Essential (primary) hypertension: Secondary | ICD-10-CM | POA: Diagnosis not present

## 2022-04-30 DIAGNOSIS — I700 Atherosclerosis of aorta: Secondary | ICD-10-CM | POA: Diagnosis not present

## 2022-04-30 DIAGNOSIS — Z7901 Long term (current) use of anticoagulants: Secondary | ICD-10-CM | POA: Diagnosis not present

## 2022-04-30 DIAGNOSIS — D696 Thrombocytopenia, unspecified: Secondary | ICD-10-CM | POA: Diagnosis not present

## 2022-04-30 DIAGNOSIS — D6869 Other thrombophilia: Secondary | ICD-10-CM | POA: Diagnosis not present

## 2022-04-30 DIAGNOSIS — M179 Osteoarthritis of knee, unspecified: Secondary | ICD-10-CM | POA: Diagnosis not present

## 2022-04-30 DIAGNOSIS — Z9181 History of falling: Secondary | ICD-10-CM | POA: Diagnosis not present

## 2022-04-30 DIAGNOSIS — I4891 Unspecified atrial fibrillation: Secondary | ICD-10-CM | POA: Diagnosis not present

## 2022-04-30 DIAGNOSIS — I270 Primary pulmonary hypertension: Secondary | ICD-10-CM | POA: Diagnosis not present

## 2022-04-30 DIAGNOSIS — I472 Ventricular tachycardia, unspecified: Secondary | ICD-10-CM | POA: Diagnosis not present

## 2022-04-30 DIAGNOSIS — Z8616 Personal history of COVID-19: Secondary | ICD-10-CM | POA: Diagnosis not present

## 2022-04-30 DIAGNOSIS — E785 Hyperlipidemia, unspecified: Secondary | ICD-10-CM | POA: Diagnosis not present

## 2022-04-30 DIAGNOSIS — D692 Other nonthrombocytopenic purpura: Secondary | ICD-10-CM | POA: Diagnosis not present

## 2022-04-30 DIAGNOSIS — I4892 Unspecified atrial flutter: Secondary | ICD-10-CM | POA: Diagnosis not present

## 2022-04-30 DIAGNOSIS — I739 Peripheral vascular disease, unspecified: Secondary | ICD-10-CM | POA: Diagnosis not present

## 2022-05-01 ENCOUNTER — Encounter: Payer: Self-pay | Admitting: Family Medicine

## 2022-05-01 ENCOUNTER — Non-Acute Institutional Stay: Payer: Medicare Other | Admitting: Family Medicine

## 2022-05-01 VITALS — BP 148/94 | HR 81 | Resp 20 | Wt 195.2 lb

## 2022-05-01 DIAGNOSIS — R1903 Right lower quadrant abdominal swelling, mass and lump: Secondary | ICD-10-CM | POA: Diagnosis not present

## 2022-05-01 DIAGNOSIS — R531 Weakness: Secondary | ICD-10-CM | POA: Diagnosis not present

## 2022-05-01 DIAGNOSIS — R791 Abnormal coagulation profile: Secondary | ICD-10-CM | POA: Insufficient documentation

## 2022-05-01 DIAGNOSIS — N179 Acute kidney failure, unspecified: Secondary | ICD-10-CM | POA: Diagnosis not present

## 2022-05-01 DIAGNOSIS — R296 Repeated falls: Secondary | ICD-10-CM | POA: Diagnosis not present

## 2022-05-01 NOTE — Progress Notes (Signed)
[facility] Palliative Care Consult Note Telephone: [phone]  Fax: [phone]   Date of encounter: 05/01/22 11:32 AM PATIENT NAME: [address] [place] [address]   [phone] (home)  DOB: [birth year] MRN: [id number] PRIMARY CARE PROVIDER:    Glenda Chroman, MD,  [place] [address] [phone]  REFERRING PROVIDER:   Glenda Chroman, MD [address] [place],   [address] [phone]  RESPONSIBLE PARTY:    Contact Information     Name Relation Home Work [place], [place] [phone]     [place] Silvius Roodhouse     Milbert Coulter is pt's son-in-law.  [name] is elderly and unable to help any longer per notes in [facility] Chart.   I met face to face with patient in his AL facility. Palliative Care was asked to follow this patient by consultation request of  Glenda Chroman, MD to address advance care planning and complex medical decision making. This is the initial visit.          ASSESSMENT, SYMPTOM MANAGEMENT AND PLAN / RECOMMENDATIONS:  Frequent Falls Need to keep wc in close range with brakes on and continue PT for safe transfers, Encourage increased rehydration Discuss risk vs benefit of continued anticoagulation with pt's age and recurrent falls  2.  Generalized weakness Likely has increased CK from falls. With acute renal failure and likely elevated CK, myalgias and muscle weakness may be exacerbated with statin use. Recommend monitoring for orthostatic changes, record I and O for 24-48 hours and document for review. Recommend checking CK and if significantly elevated, holding atorvastatin until levels have normalized If dry and hypotensive, beta blockade may be contributing to syncope through rate control preventing increase in heart rate to compensate decreased BP.  3.   Acute Renal Failure Likely multifactorial-could be  from low BP with orthostasis and inability to compensate from Beta blocker.  If rhabdomyolysis from falls and medications-pt may reduce risk if bolused with IV fluids cautiously. Recommend repeat CMP, obtain CK and if CK elevated serial monitoring 2-3 times per week until normalized. If IV hydration not indicated, would push oral rehydration. Hold Atorvastatin until renal function normalizes. Maintain SBP > 90 mm HG.  4.  Supratherapeutic INR Recommend holding dose of Coumadin, repeat PT/INR Needs vitamin K consistent diet. Consider d/c of Coumadin risk vs benefit with falls Would recommend looking at d/c of Lovaza and Omega 3 fatty acids which could potentiate bleeding risk.  5.  Abdominal swelling RLQ/flank Monitor for pain, increased swelling or ecchymosis, hematuria or blood in stool.     Follow up Palliative Care Visit: Palliative care will continue to follow for complex medical decision making, advance care planning, and clarification of goals. Return 4 weeks or prn.    This visit was coded based on medical decision making (MDM).  PPS: 50%  HOSPICE ELIGIBILITY/DIAGNOSIS: TBD  Chief Complaint:  [name] received a referral to follow up with patient for chronic disease management in setting of dementia with persistent atrial fibrillation/atrial flutter on Coumadin anticoagulation with frequent falls.  Palliative Care was also requested to assist with advance directive planning and defining/refining goals of care.  HISTORY OF PRESENT ILLNESS:  [name] is a 88 y.o. year old male with paroxysmal afib/aflutter, orthostatic hypotension. Secondary cardiomyopathy (tachycardia induced with afib rvr) s/p ablation and cardioversion with hx of amiodarone therapy,  on long term Coumadin therapy due to ability to afford NOACs. Unsure if pt is following Vitamin K consistent diet.  Staff states that when pt uses walker he tends to fall and he has been  encouraged to use the wheelchair.  Pt reports feeling lightheaded and dizzy, denies CP, SOB, palpitations.  Denies nausea/vomiting, dysuria or constipation although he has a very strong odor of urine on approach.  He says he could be drinking better.  He was positive for Covid 19 on 02/27/22. Had 3 falls prior to last ED visit on 04/27/22.  Provider at ED did not indicate any abdominal trauma.  Pt denies pain with palpation of abdomen. Nursing indicates no noted fall since most recent ED visit.  States he is not drinking enough, is fatigued and can get OOB by himself but takes a lot of "squirming" to eventually get up.  He has worked with PT at the facility.  Most recent eGFR 53 on 04/27/22 decreased from eGFR 84 on 03/10/22 with increase of Cr from 0.89 to 1.31 and rising BUN.  History obtained from review of EMR, discussion with facility staff and/or [name].   Data from [facility]: Related to CBC w/ Differential Component 04/27/22 04/10/22 03/09/22 03/06/22 03/03/22 03/02/22  WBC 7.8 10.0 12.7 High  12.1 High  10.4 10.0  RBC 3.82 Low  3.72 Low  3.92 Low  3.39 Low  3.71 Low  4.26  HGB 12.2 Low  11.9 Low  12.4 Low  10.5 Low  11.4 Low  13.1  HCT 37.5 36.1 36.2 30.9 Low  33.3 Low  38.5  MCV 98.2 High  97.0 92.3 91.2 89.8 90.4  MCH 31.9 32.0 31.6 31.0 30.7 30.8  MCHC 32.5 33.0 34.3 34.0 34.2 34.0  RDW 14.9 High  15.5 High  15.0 High  14.4 14.4 14.3  MPV 10.4 10.6 High  11.4 High  11.6 High  10.9 High  11.2 High   Platelet 147 172 152 163 125 Low  141  Neutrophils % 75.1 77.5 -- -- 89.3 85.1  Lymphocytes % 13.8 12.3 -- -- 5.7 8.2  Monocytes % 8.9 8.6 -- -- 4.0 5.7  Eosinophils % 1.3 0.4 -- -- 0.0 0.0  Basophils % 0.3 0.4 -- -- 0.1 0.1  Absolute Neutrophils 5.8 7.8 -- -- 9.3 High  8.5 High   Absolute Lymphocytes 1.1 1.2 -- -- 0.6 Low  0.8  Absolute Monocytes 0.7 0.9 -- -- 0.4 0.6  Absolute Eosinophils 0.1 0.0 -- -- 0.0 0.0  Absolute Basophils 0.0 0.0 -- -- 0.0 0.0   Component 04/27/22  04/10/22 03/23/22 03/19/22 03/12/22 03/10/22  PT 31.3 High  12.1 High  20.9 High  44.9 High  24.4 High  20.2 High   INR 3.10 1.19 2.06 4.47 2.41 1.99   Component 04/27/22 04/10/22 03/10/22 03/09/22 03/08/22 03/06/22  Sodium 142 141 138  132 Low  131 Low  132 Low   Potassium 3.8 3.9 4.1 3.8 4.0 3.8  Chloride 106 107 104 100 99 99  CO2 29.4 29.6 28.8 27.4 28.7 27.2  Anion Gap _0 BUN 27 High  24 High  25 High  29 High  29 High  31 High   Creatinine 1.31 High  1.20 0.89 0.87 0.96 1.05  BUN/Creatinine Ratio _1 33 30 30  eGFR CKD-EPI (2021) Male 53 Low   59 Low   84  85  77  70   Glucose 128 111 102 103  143 167  Calcium 9.1 8.6 8.1 Low  8.6 8.4 Low  7.9 Low   Albumin 3.3 Low  2.9 Low  -- -- -- --  Total Protein 6.5 6.1 -- -- -- --  Total Bilirubin 0.9 0.5 -- -- -- --  AST 17 12 Low  -- -- -- --  ALT 15 16 -- -- -- --  Alkaline Phosphatase 61 56 -- -- -- --   Component 04/27/22 02/27/22 10/20/21 01/31/20 06/24/17 06/24/17  Color, UA Yellow Yellow Yellow Yellow RED --  Clarity, UA Clear Clear Clear Clear -- --  Specific Gravity, UA >=1.030 High  >=1.030 High  1.025 1.025 >=1.030 High  --  pH, UA 5.5 5.5 5.5 5.5 5.0 --  Leukocyte Esterase, UA _0  --  Nitrite, UA _1  --  Protein, UA Trace Abnormal  100 mg/dL Abnormal  Trace Abnormal  30 mg/dL Abnormal  100 High  --  Glucose, UA _2  --  Ketones, UA Negative Trace Abnormal  Negative Negative NEGATIVE --  Urobilinogen, UA 1.0 mg/dL 0.2 mg/dL 4.0 mg/dL Abnormal  0.2 mg/dL 1.0 --  Bilirubin, UA _3  --  Blood, UA Negative Large Abnormal  Negative Negative LARGE High  --  RBC, UA _4 -- -- 40-50 High   WBC, UA _5 -- -- --  Squam Epithel, UA _6 -- -- 0-5  Bacteria, UA Few Occasional None Seen -- -- --  Ca Oxalate Crystal, UA Rare -- -- -- -- --   I reviewed  EMR for available labs, medications, imaging, studies and related documents.  No new records since last visit/Records reviewed and summarized above.   ROS General: NAD EYES: denies vision changes ENMT: denies dysphagia Cardiovascular: denies chest pain, denies DOE Pulmonary: endorses sore throat and dryness "for a while", denies cough, denies SOB Abdomen: endorses good appetite, denies constipation, endorses continence of bowel GU: denies dysuria, endorses incontinence of urine MSK:  endorses generalized weakness, repetitive  falls reported Skin: endorses wounds from falls Neurological: denies pain, headache, denies insomnia Psych: Endorses positive mood Heme/lymph/immuno: endorses bruising from falls, abnormal bleeding  Physical Exam: Current and past weights: 1965 lbs 3.2 ounces as of 04/23/22 Constitutional: NAD General: frail appearing, mildly disheveled Head:  Noted abrasion to left forehead healing without erythema.  Noted dark, dried, scabbed lesion on left parietal area of head ENMT: intact hearing CV: S1S2, RRR, no LE edema Pulmonary: CTAB, no increased work of breathing, no cough, room air Abdomen:  normo-active BS + 4 quadrants,  non tender, no guarding and noted edema, yellow-brown bruising of right flank under CVA with no pain on palpation GU: deferred MSK: no sarcopenia, moves all extremities Skin: warm and dry, scabbed lesion to right posterior forearm, mildly erythematous bruised appearing area to left upper arm with some increased edema of left arm.  No pain with palpation.  Noted fading purple ecchymosis on lower lumbar spine Neuro:  no generalized weakness, no cognitive impairment Psych: non-anxious affect, A and O x 3 Hem/lymph/immuno: scattered widespread bruising in arms and abdomen in varying stages of healing, none noted in BLE  CURRENT PROBLEM LIST:  Patient Active Problem List   Diagnosis Date Noted   Orthostatic hypotension 04/23/2022   Permanent atrial  fibrillation ([place]) 09/07/2020   Chronic atrial fibrillation ([place]) 03/30/2016   Encounter for therapeutic drug monitoring 07/21/2013   SOB (shortness of breath) 12/23/2012  Essential hypertension, benign    H/O amiodarone therapy    Secondary cardiomyopathy, unspecified    Encounter for long-term (current) use of anticoagulants 10/02/2010   Unspecified atrial flutter ([place]) 06/22/2009   PAST MEDICAL HISTORY:  Active Ambulatory Problems    Diagnosis Date Noted   Unspecified atrial flutter ([place]) 06/22/2009   Encounter for long-term (current) use of anticoagulants 10/02/2010   Secondary cardiomyopathy, unspecified    Essential hypertension, benign    H/O amiodarone therapy    SOB (shortness of breath) 12/23/2012   Encounter for therapeutic drug monitoring 07/21/2013   Chronic atrial fibrillation ([place]) 03/30/2016   Permanent atrial fibrillation ([place]) 09/07/2020   Orthostatic hypotension 04/23/2022   Resolved Ambulatory Problems    Diagnosis Date Noted   LEFT VENTRICULAR FUNCTION, DECREASED 03/24/2009   DYSPNEA 11/30/2009   ABDOMINAL BLOATING 06/22/2009   Atrial flutter, paroxysmal ([facility])    Ejection fraction    IBS (irritable bowel syndrome)    Warfarin anticoagulation    Past Medical History:  Diagnosis Date   BPH (benign prostatic hypertrophy)    Cardiomyopathy ([facility])    Urination, excessive at night    SOCIAL HX:  Social History   Tobacco Use   Smoking status: Never    Passive exposure: Never   Smokeless tobacco: Never   Tobacco comments:    tobacco use- no  Substance Use Topics   Alcohol use: No    Alcohol/week: 0.0 standard drinks of alcohol   FAMILY HX:  Family History  Problem Relation Age of Onset   Diabetes Mother    Dementia Mother    Dementia Father    Diabetes Paternal Grandmother        Preferred Pharmacy: ALLERGIES:  Allergies  Allergen Reactions   Penicillins Rash    Has patient had a PCN reaction causing immediate rash, facial/tongue/throat  swelling, SOB or lightheadedness with hypotension: Yes Has patient had a PCN reaction causing severe rash involving mucus membranes or skin necrosis: No Has patient had a PCN reaction that required hospitalization: No Has patient had a PCN reaction occurring within the last 10 years: No If all of the above answers are "NO", then may proceed with Cephalosporin use.    Sulfonamide Derivatives Rash     PERTINENT MEDICATIONS:  Outpatient Encounter Medications as of 05/01/2022  Medication Sig   alfuzosin (UROXATRAL) 10 MG 24 hr tablet Take 10 mg by mouth at bedtime.   atorvastatin (LIPITOR) 10 MG tablet Take 10 mg by mouth daily.    citalopram (CELEXA) 20 MG tablet Take 1 tablet by mouth daily.   famotidine (PEPCID) 20 MG tablet Take 1 tablet by mouth daily.   Flaxseed, Linseed, (FLAXSEED OIL) 1000 MG CAPS Take 1 capsule by mouth daily.   latanoprost (XALATAN) 0.005 % ophthalmic solution Place 1 drop into both eyes at bedtime.   metoprolol succinate (TOPROL-XL) 25 MG 24 hr tablet TAKE 1/2 TABLET BY MOUTH ONCE DAILY; SCHEDULE AN APPOINTMENT FOR FURTHER REFILLS; FINAL ATTEMPT.   Omega-3 Fatty Acids (FISH OIL) 1000 MG CAPS Take 1,000 mg by mouth. OCCASIONALLY   warfarin (COUMADIN) 5 MG tablet TAKE ONE TABLET BY MOUTH DAILY EXCEPT TAKE 1 AND 1/2 TABLETS ON TUESDAY AND SATURDAY.   No facility-administered encounter medications on file as of 05/01/2022.     -------------------------------------------------------------------------------------------------------------------------------------------------------------------------------------------------------------------------------------------- Advance Care Planning/Goals of Care: Goals include to maximize quality of life and symptom management. Patient/health care surrogate gave his/her permission to discuss.Our advance care planning conversation included a discussion about:    The  value and importance of advance care planning  Experiences with loved ones  who have been seriously ill or have died  Exploration of personal, cultural or spiritual beliefs that might influence medical decisions  Exploration of goals of care in the event of a sudden injury or illness  Identification  of a healthcare agent-[facility] [place] Review and updating or creation of an  advance directive document . Decision not to resuscitate or to de-escalate disease focused treatments due to poor prognosis. CODE STATUS:     Thank you for the opportunity to participate in the care of [name].  The palliative care team will continue to follow. Please call our office at [phone] if we can be of additional assistance.   Marijo Conception, FNP-C  COVID-19 PATIENT SCREENING TOOL Asked and negative response unless otherwise noted:  Have you had symptoms of covid, tested positive or been in contact with someone with symptoms/positive test in the past 5-10 days?

## 2022-05-02 ENCOUNTER — Telehealth: Payer: Self-pay | Admitting: *Deleted

## 2022-05-02 ENCOUNTER — Ambulatory Visit: Payer: Medicare Other | Attending: Cardiology | Admitting: *Deleted

## 2022-05-02 ENCOUNTER — Telehealth: Payer: Self-pay | Admitting: Family Medicine

## 2022-05-02 DIAGNOSIS — D6869 Other thrombophilia: Secondary | ICD-10-CM | POA: Diagnosis not present

## 2022-05-02 DIAGNOSIS — I482 Chronic atrial fibrillation, unspecified: Secondary | ICD-10-CM | POA: Diagnosis not present

## 2022-05-02 DIAGNOSIS — D696 Thrombocytopenia, unspecified: Secondary | ICD-10-CM | POA: Diagnosis not present

## 2022-05-02 DIAGNOSIS — I700 Atherosclerosis of aorta: Secondary | ICD-10-CM | POA: Diagnosis not present

## 2022-05-02 DIAGNOSIS — I7121 Aneurysm of the ascending aorta, without rupture: Secondary | ICD-10-CM | POA: Diagnosis not present

## 2022-05-02 DIAGNOSIS — Z5181 Encounter for therapeutic drug level monitoring: Secondary | ICD-10-CM

## 2022-05-02 DIAGNOSIS — I472 Ventricular tachycardia, unspecified: Secondary | ICD-10-CM | POA: Diagnosis not present

## 2022-05-02 DIAGNOSIS — Z7901 Long term (current) use of anticoagulants: Secondary | ICD-10-CM | POA: Diagnosis not present

## 2022-05-02 DIAGNOSIS — Z9181 History of falling: Secondary | ICD-10-CM | POA: Diagnosis not present

## 2022-05-02 DIAGNOSIS — Z8616 Personal history of COVID-19: Secondary | ICD-10-CM | POA: Diagnosis not present

## 2022-05-02 DIAGNOSIS — I739 Peripheral vascular disease, unspecified: Secondary | ICD-10-CM | POA: Diagnosis not present

## 2022-05-02 DIAGNOSIS — I10 Essential (primary) hypertension: Secondary | ICD-10-CM | POA: Diagnosis not present

## 2022-05-02 DIAGNOSIS — I4892 Unspecified atrial flutter: Secondary | ICD-10-CM | POA: Diagnosis not present

## 2022-05-02 DIAGNOSIS — I4891 Unspecified atrial fibrillation: Secondary | ICD-10-CM | POA: Diagnosis not present

## 2022-05-02 DIAGNOSIS — M179 Osteoarthritis of knee, unspecified: Secondary | ICD-10-CM | POA: Diagnosis not present

## 2022-05-02 DIAGNOSIS — E785 Hyperlipidemia, unspecified: Secondary | ICD-10-CM | POA: Diagnosis not present

## 2022-05-02 DIAGNOSIS — D692 Other nonthrombocytopenic purpura: Secondary | ICD-10-CM | POA: Diagnosis not present

## 2022-05-02 DIAGNOSIS — I270 Primary pulmonary hypertension: Secondary | ICD-10-CM | POA: Diagnosis not present

## 2022-05-02 LAB — POCT INR: INR: 2.7 (ref 2.0–3.0)

## 2022-05-02 NOTE — Telephone Encounter (Signed)
-----   Message from Marijo Conception, [place] sent at 05/02/2022  8:18 AM EST ----- Regarding: coumadin/falls Ms Joneen Caraway, My name is Damaris Hippo and I am a nurse practitioner with Palliative Care following our mutual patient.  Just FYI he is having multiple falls and has a right flank hematoma that appears to be resolving.  He was in acute renal failure with the last fall on 04/27/22 when seen at [facility]. He is also having orthostatic symptoms. I know he is on Beta Blocker for rate control but also know this may be blunting his ability to compensate for the drop in pressure.  I have sent PCP recommendations to recheck CBC, CMP, PT/INR (last was 3.10 on 04/27/22).  I also recommended checking a CK which is more than likely elevated simply with number of falls but he is c/o weakness and my concern is that the CK may be contributing to acute kidney injury.  I had recommended that if he was sufficiently elevated they may want to think about holding his Atorvastatin until his CK and renal function normalize.  I think if his CK is sufficiently elevated he may need IV hydration as he looks very dry.  Also with 4-5 falls in the last month I am wondering if the Cardiologist thinks he should remain on Coumadin or at least we could address risks and benefits with patient and his POA. With access to Epic you guys can read my note. I saw him yesterday. Don't hesitate to contact me if you have any questions. Damaris Hippo, [place] Cell [phone]

## 2022-05-02 NOTE — Telephone Encounter (Signed)
Left message for [name], son-in-law for pt to review Hydrologist, discuss Palliative Services and my visit with pt.  Left phone number for call back.  Damaris Hippo FNP-C

## 2022-05-02 NOTE — Telephone Encounter (Signed)
Message routed to Dr Dellia Cloud to advise on continuing or stopping warfarin.

## 2022-05-02 NOTE — Patient Instructions (Signed)
Pt at [facility] now.  Continue warfarin 7.'5mg'$  daily except '5mg'$  on Tuesdays and Fridays Recheck INR on 05/16/22 @ 11:15 am. Written Order sent to [facility].

## 2022-05-03 ENCOUNTER — Telehealth: Payer: Self-pay | Admitting: *Deleted

## 2022-05-03 DIAGNOSIS — Z880 Allergy status to penicillin: Secondary | ICD-10-CM | POA: Diagnosis not present

## 2022-05-03 DIAGNOSIS — G319 Degenerative disease of nervous system, unspecified: Secondary | ICD-10-CM | POA: Diagnosis not present

## 2022-05-03 DIAGNOSIS — S0990XA Unspecified injury of head, initial encounter: Secondary | ICD-10-CM | POA: Diagnosis not present

## 2022-05-03 DIAGNOSIS — I10 Essential (primary) hypertension: Secondary | ICD-10-CM | POA: Diagnosis not present

## 2022-05-03 DIAGNOSIS — R079 Chest pain, unspecified: Secondary | ICD-10-CM | POA: Diagnosis not present

## 2022-05-03 DIAGNOSIS — Z882 Allergy status to sulfonamides status: Secondary | ICD-10-CM | POA: Diagnosis not present

## 2022-05-03 DIAGNOSIS — I4891 Unspecified atrial fibrillation: Secondary | ICD-10-CM | POA: Diagnosis not present

## 2022-05-03 DIAGNOSIS — W1839XA Other fall on same level, initial encounter: Secondary | ICD-10-CM | POA: Diagnosis not present

## 2022-05-03 DIAGNOSIS — Z881 Allergy status to other antibiotic agents status: Secondary | ICD-10-CM | POA: Diagnosis not present

## 2022-05-03 DIAGNOSIS — Z043 Encounter for examination and observation following other accident: Secondary | ICD-10-CM | POA: Diagnosis not present

## 2022-05-03 DIAGNOSIS — S022XXA Fracture of nasal bones, initial encounter for closed fracture: Secondary | ICD-10-CM | POA: Diagnosis not present

## 2022-05-03 NOTE — Telephone Encounter (Addendum)
Message forwarded to [place].  ----- Message from Chalmers Guest, MD sent at 05/02/2022  5:35 PM EST ----- Regarding: RE: coumadin/falls Can hold Coumadin due to frequent falls. Will see him in the clinic and evaluate him if he is a good candidate for watchman. ----- Message ----- From: Malen Gauze, RN Sent: 05/02/2022   9:38 AM EST To: Chalmers Guest, MD Subject: FW: coumadin/falls                             Dr Dellia Cloud, Please see below.  If you think warfarin risk outweighs benefit and wish to discontinue please let me know. Thanks, Lattie Haw ----- Message ----- From: Marijo Conception, FNP Sent: 05/02/2022   8:27 AM EST To: Malen Gauze, RN Subject: coumadin/falls                                 Ms Joneen Caraway, My name is Damaris Hippo and I am a nurse practitioner with Palliative Care following our mutual patient.  Just FYI he is having multiple falls and has a right flank hematoma that appears to be resolving.  He was in acute renal failure with the last fall on 04/27/22 when seen at [facility]. He is also having orthostatic symptoms. I know he is on Beta Blocker for rate control but also know this may be blunting his ability to compensate for the drop in pressure.  I have sent PCP recommendations to recheck CBC, CMP, PT/INR (last was 3.10 on 04/27/22).  I also recommended checking a CK which is more than likely elevated simply with number of falls but he is c/o weakness and my concern is that the CK may be contributing to acute kidney injury.  I had recommended that if he was sufficiently elevated they may want to think about holding his Atorvastatin until his CK and renal function normalize.  I think if his CK is sufficiently elevated he may need IV hydration as he looks very dry.  Also with 4-5 falls in the last month I am wondering if the Cardiologist thinks he should remain on Coumadin or at least we could address risks and benefits  with patient and his POA. With access to Epic you guys can read my note. I saw him yesterday. Don't hesitate to contact me if you have any questions. Damaris Hippo, [place] Cell [phone]

## 2022-05-08 DIAGNOSIS — D692 Other nonthrombocytopenic purpura: Secondary | ICD-10-CM | POA: Diagnosis not present

## 2022-05-08 DIAGNOSIS — I4891 Unspecified atrial fibrillation: Secondary | ICD-10-CM | POA: Diagnosis not present

## 2022-05-08 DIAGNOSIS — D6869 Other thrombophilia: Secondary | ICD-10-CM | POA: Diagnosis not present

## 2022-05-08 DIAGNOSIS — Z7901 Long term (current) use of anticoagulants: Secondary | ICD-10-CM | POA: Diagnosis not present

## 2022-05-08 DIAGNOSIS — E785 Hyperlipidemia, unspecified: Secondary | ICD-10-CM | POA: Diagnosis not present

## 2022-05-08 DIAGNOSIS — I700 Atherosclerosis of aorta: Secondary | ICD-10-CM | POA: Diagnosis not present

## 2022-05-08 DIAGNOSIS — M179 Osteoarthritis of knee, unspecified: Secondary | ICD-10-CM | POA: Diagnosis not present

## 2022-05-08 DIAGNOSIS — I4892 Unspecified atrial flutter: Secondary | ICD-10-CM | POA: Diagnosis not present

## 2022-05-08 DIAGNOSIS — I739 Peripheral vascular disease, unspecified: Secondary | ICD-10-CM | POA: Diagnosis not present

## 2022-05-08 DIAGNOSIS — I7121 Aneurysm of the ascending aorta, without rupture: Secondary | ICD-10-CM | POA: Diagnosis not present

## 2022-05-08 DIAGNOSIS — Z8616 Personal history of COVID-19: Secondary | ICD-10-CM | POA: Diagnosis not present

## 2022-05-08 DIAGNOSIS — D696 Thrombocytopenia, unspecified: Secondary | ICD-10-CM | POA: Diagnosis not present

## 2022-05-08 DIAGNOSIS — I270 Primary pulmonary hypertension: Secondary | ICD-10-CM | POA: Diagnosis not present

## 2022-05-08 DIAGNOSIS — I472 Ventricular tachycardia, unspecified: Secondary | ICD-10-CM | POA: Diagnosis not present

## 2022-05-08 DIAGNOSIS — I10 Essential (primary) hypertension: Secondary | ICD-10-CM | POA: Diagnosis not present

## 2022-05-08 DIAGNOSIS — Z9181 History of falling: Secondary | ICD-10-CM | POA: Diagnosis not present

## 2022-05-09 DIAGNOSIS — I700 Atherosclerosis of aorta: Secondary | ICD-10-CM | POA: Diagnosis not present

## 2022-05-09 DIAGNOSIS — M179 Osteoarthritis of knee, unspecified: Secondary | ICD-10-CM | POA: Diagnosis not present

## 2022-05-09 DIAGNOSIS — E785 Hyperlipidemia, unspecified: Secondary | ICD-10-CM | POA: Diagnosis not present

## 2022-05-09 DIAGNOSIS — Z7901 Long term (current) use of anticoagulants: Secondary | ICD-10-CM | POA: Diagnosis not present

## 2022-05-09 DIAGNOSIS — I10 Essential (primary) hypertension: Secondary | ICD-10-CM | POA: Diagnosis not present

## 2022-05-09 DIAGNOSIS — D6869 Other thrombophilia: Secondary | ICD-10-CM | POA: Diagnosis not present

## 2022-05-09 DIAGNOSIS — I4892 Unspecified atrial flutter: Secondary | ICD-10-CM | POA: Diagnosis not present

## 2022-05-09 DIAGNOSIS — I4891 Unspecified atrial fibrillation: Secondary | ICD-10-CM | POA: Diagnosis not present

## 2022-05-09 DIAGNOSIS — I472 Ventricular tachycardia, unspecified: Secondary | ICD-10-CM | POA: Diagnosis not present

## 2022-05-09 DIAGNOSIS — Z8616 Personal history of COVID-19: Secondary | ICD-10-CM | POA: Diagnosis not present

## 2022-05-09 DIAGNOSIS — I739 Peripheral vascular disease, unspecified: Secondary | ICD-10-CM | POA: Diagnosis not present

## 2022-05-09 DIAGNOSIS — D696 Thrombocytopenia, unspecified: Secondary | ICD-10-CM | POA: Diagnosis not present

## 2022-05-09 DIAGNOSIS — I270 Primary pulmonary hypertension: Secondary | ICD-10-CM | POA: Diagnosis not present

## 2022-05-09 DIAGNOSIS — I7121 Aneurysm of the ascending aorta, without rupture: Secondary | ICD-10-CM | POA: Diagnosis not present

## 2022-05-09 DIAGNOSIS — Z9181 History of falling: Secondary | ICD-10-CM | POA: Diagnosis not present

## 2022-05-09 DIAGNOSIS — D692 Other nonthrombocytopenic purpura: Secondary | ICD-10-CM | POA: Diagnosis not present

## 2022-05-10 DIAGNOSIS — I4892 Unspecified atrial flutter: Secondary | ICD-10-CM | POA: Diagnosis not present

## 2022-05-10 DIAGNOSIS — M179 Osteoarthritis of knee, unspecified: Secondary | ICD-10-CM | POA: Diagnosis not present

## 2022-05-10 DIAGNOSIS — D696 Thrombocytopenia, unspecified: Secondary | ICD-10-CM | POA: Diagnosis not present

## 2022-05-10 DIAGNOSIS — I4891 Unspecified atrial fibrillation: Secondary | ICD-10-CM | POA: Diagnosis not present

## 2022-05-10 DIAGNOSIS — E785 Hyperlipidemia, unspecified: Secondary | ICD-10-CM | POA: Diagnosis not present

## 2022-05-10 DIAGNOSIS — Z9181 History of falling: Secondary | ICD-10-CM | POA: Diagnosis not present

## 2022-05-10 DIAGNOSIS — Z7901 Long term (current) use of anticoagulants: Secondary | ICD-10-CM | POA: Diagnosis not present

## 2022-05-10 DIAGNOSIS — Z299 Encounter for prophylactic measures, unspecified: Secondary | ICD-10-CM | POA: Diagnosis not present

## 2022-05-10 DIAGNOSIS — Z8616 Personal history of COVID-19: Secondary | ICD-10-CM | POA: Diagnosis not present

## 2022-05-10 DIAGNOSIS — I472 Ventricular tachycardia, unspecified: Secondary | ICD-10-CM | POA: Diagnosis not present

## 2022-05-10 DIAGNOSIS — I270 Primary pulmonary hypertension: Secondary | ICD-10-CM | POA: Diagnosis not present

## 2022-05-10 DIAGNOSIS — I7121 Aneurysm of the ascending aorta, without rupture: Secondary | ICD-10-CM | POA: Diagnosis not present

## 2022-05-10 DIAGNOSIS — I700 Atherosclerosis of aorta: Secondary | ICD-10-CM | POA: Diagnosis not present

## 2022-05-10 DIAGNOSIS — S022XXA Fracture of nasal bones, initial encounter for closed fracture: Secondary | ICD-10-CM | POA: Diagnosis not present

## 2022-05-10 DIAGNOSIS — D6869 Other thrombophilia: Secondary | ICD-10-CM | POA: Diagnosis not present

## 2022-05-10 DIAGNOSIS — D692 Other nonthrombocytopenic purpura: Secondary | ICD-10-CM | POA: Diagnosis not present

## 2022-05-10 DIAGNOSIS — I10 Essential (primary) hypertension: Secondary | ICD-10-CM | POA: Diagnosis not present

## 2022-05-10 DIAGNOSIS — I739 Peripheral vascular disease, unspecified: Secondary | ICD-10-CM | POA: Diagnosis not present

## 2022-05-14 DIAGNOSIS — I4892 Unspecified atrial flutter: Secondary | ICD-10-CM | POA: Diagnosis not present

## 2022-05-14 DIAGNOSIS — I700 Atherosclerosis of aorta: Secondary | ICD-10-CM | POA: Diagnosis not present

## 2022-05-14 DIAGNOSIS — E785 Hyperlipidemia, unspecified: Secondary | ICD-10-CM | POA: Diagnosis not present

## 2022-05-14 DIAGNOSIS — D692 Other nonthrombocytopenic purpura: Secondary | ICD-10-CM | POA: Diagnosis not present

## 2022-05-14 DIAGNOSIS — I739 Peripheral vascular disease, unspecified: Secondary | ICD-10-CM | POA: Diagnosis not present

## 2022-05-14 DIAGNOSIS — Z9181 History of falling: Secondary | ICD-10-CM | POA: Diagnosis not present

## 2022-05-14 DIAGNOSIS — D696 Thrombocytopenia, unspecified: Secondary | ICD-10-CM | POA: Diagnosis not present

## 2022-05-14 DIAGNOSIS — Z7901 Long term (current) use of anticoagulants: Secondary | ICD-10-CM | POA: Diagnosis not present

## 2022-05-14 DIAGNOSIS — I472 Ventricular tachycardia, unspecified: Secondary | ICD-10-CM | POA: Diagnosis not present

## 2022-05-14 DIAGNOSIS — I10 Essential (primary) hypertension: Secondary | ICD-10-CM | POA: Diagnosis not present

## 2022-05-14 DIAGNOSIS — M179 Osteoarthritis of knee, unspecified: Secondary | ICD-10-CM | POA: Diagnosis not present

## 2022-05-14 DIAGNOSIS — I4891 Unspecified atrial fibrillation: Secondary | ICD-10-CM | POA: Diagnosis not present

## 2022-05-14 DIAGNOSIS — D6869 Other thrombophilia: Secondary | ICD-10-CM | POA: Diagnosis not present

## 2022-05-14 DIAGNOSIS — I7121 Aneurysm of the ascending aorta, without rupture: Secondary | ICD-10-CM | POA: Diagnosis not present

## 2022-05-14 DIAGNOSIS — Z8616 Personal history of COVID-19: Secondary | ICD-10-CM | POA: Diagnosis not present

## 2022-05-14 DIAGNOSIS — I270 Primary pulmonary hypertension: Secondary | ICD-10-CM | POA: Diagnosis not present

## 2022-05-15 DIAGNOSIS — I4892 Unspecified atrial flutter: Secondary | ICD-10-CM | POA: Diagnosis not present

## 2022-05-15 DIAGNOSIS — Z9181 History of falling: Secondary | ICD-10-CM | POA: Diagnosis not present

## 2022-05-15 DIAGNOSIS — Z299 Encounter for prophylactic measures, unspecified: Secondary | ICD-10-CM | POA: Diagnosis not present

## 2022-05-15 DIAGNOSIS — I700 Atherosclerosis of aorta: Secondary | ICD-10-CM | POA: Diagnosis not present

## 2022-05-15 DIAGNOSIS — I7121 Aneurysm of the ascending aorta, without rupture: Secondary | ICD-10-CM | POA: Diagnosis not present

## 2022-05-15 DIAGNOSIS — I472 Ventricular tachycardia, unspecified: Secondary | ICD-10-CM | POA: Diagnosis not present

## 2022-05-15 DIAGNOSIS — E785 Hyperlipidemia, unspecified: Secondary | ICD-10-CM | POA: Diagnosis not present

## 2022-05-15 DIAGNOSIS — D692 Other nonthrombocytopenic purpura: Secondary | ICD-10-CM | POA: Diagnosis not present

## 2022-05-15 DIAGNOSIS — Z8616 Personal history of COVID-19: Secondary | ICD-10-CM | POA: Diagnosis not present

## 2022-05-15 DIAGNOSIS — I4891 Unspecified atrial fibrillation: Secondary | ICD-10-CM | POA: Diagnosis not present

## 2022-05-15 DIAGNOSIS — I739 Peripheral vascular disease, unspecified: Secondary | ICD-10-CM | POA: Diagnosis not present

## 2022-05-15 DIAGNOSIS — I270 Primary pulmonary hypertension: Secondary | ICD-10-CM | POA: Diagnosis not present

## 2022-05-15 DIAGNOSIS — M25551 Pain in right hip: Secondary | ICD-10-CM | POA: Diagnosis not present

## 2022-05-15 DIAGNOSIS — Z7901 Long term (current) use of anticoagulants: Secondary | ICD-10-CM | POA: Diagnosis not present

## 2022-05-15 DIAGNOSIS — D696 Thrombocytopenia, unspecified: Secondary | ICD-10-CM | POA: Diagnosis not present

## 2022-05-15 DIAGNOSIS — D6869 Other thrombophilia: Secondary | ICD-10-CM | POA: Diagnosis not present

## 2022-05-15 DIAGNOSIS — M179 Osteoarthritis of knee, unspecified: Secondary | ICD-10-CM | POA: Diagnosis not present

## 2022-05-15 DIAGNOSIS — R350 Frequency of micturition: Secondary | ICD-10-CM | POA: Diagnosis not present

## 2022-05-15 DIAGNOSIS — I10 Essential (primary) hypertension: Secondary | ICD-10-CM | POA: Diagnosis not present

## 2022-05-16 DIAGNOSIS — D6869 Other thrombophilia: Secondary | ICD-10-CM | POA: Diagnosis not present

## 2022-05-16 DIAGNOSIS — E785 Hyperlipidemia, unspecified: Secondary | ICD-10-CM | POA: Diagnosis not present

## 2022-05-16 DIAGNOSIS — I472 Ventricular tachycardia, unspecified: Secondary | ICD-10-CM | POA: Diagnosis not present

## 2022-05-16 DIAGNOSIS — I270 Primary pulmonary hypertension: Secondary | ICD-10-CM | POA: Diagnosis not present

## 2022-05-16 DIAGNOSIS — M179 Osteoarthritis of knee, unspecified: Secondary | ICD-10-CM | POA: Diagnosis not present

## 2022-05-16 DIAGNOSIS — I7121 Aneurysm of the ascending aorta, without rupture: Secondary | ICD-10-CM | POA: Diagnosis not present

## 2022-05-16 DIAGNOSIS — Z9181 History of falling: Secondary | ICD-10-CM | POA: Diagnosis not present

## 2022-05-16 DIAGNOSIS — R350 Frequency of micturition: Secondary | ICD-10-CM | POA: Diagnosis not present

## 2022-05-16 DIAGNOSIS — Z7901 Long term (current) use of anticoagulants: Secondary | ICD-10-CM | POA: Diagnosis not present

## 2022-05-16 DIAGNOSIS — I739 Peripheral vascular disease, unspecified: Secondary | ICD-10-CM | POA: Diagnosis not present

## 2022-05-16 DIAGNOSIS — D696 Thrombocytopenia, unspecified: Secondary | ICD-10-CM | POA: Diagnosis not present

## 2022-05-16 DIAGNOSIS — I4892 Unspecified atrial flutter: Secondary | ICD-10-CM | POA: Diagnosis not present

## 2022-05-16 DIAGNOSIS — D692 Other nonthrombocytopenic purpura: Secondary | ICD-10-CM | POA: Diagnosis not present

## 2022-05-16 DIAGNOSIS — I10 Essential (primary) hypertension: Secondary | ICD-10-CM | POA: Diagnosis not present

## 2022-05-16 DIAGNOSIS — Z299 Encounter for prophylactic measures, unspecified: Secondary | ICD-10-CM | POA: Diagnosis not present

## 2022-05-16 DIAGNOSIS — I4891 Unspecified atrial fibrillation: Secondary | ICD-10-CM | POA: Diagnosis not present

## 2022-05-16 DIAGNOSIS — I700 Atherosclerosis of aorta: Secondary | ICD-10-CM | POA: Diagnosis not present

## 2022-05-16 DIAGNOSIS — Z8616 Personal history of COVID-19: Secondary | ICD-10-CM | POA: Diagnosis not present

## 2022-05-17 DIAGNOSIS — M25551 Pain in right hip: Secondary | ICD-10-CM | POA: Diagnosis not present

## 2022-05-21 DIAGNOSIS — R6889 Other general symptoms and signs: Secondary | ICD-10-CM | POA: Diagnosis not present

## 2022-05-21 DIAGNOSIS — I4891 Unspecified atrial fibrillation: Secondary | ICD-10-CM | POA: Diagnosis not present

## 2022-05-21 DIAGNOSIS — R778 Other specified abnormalities of plasma proteins: Secondary | ICD-10-CM | POA: Diagnosis not present

## 2022-05-21 DIAGNOSIS — Z20822 Contact with and (suspected) exposure to covid-19: Secondary | ICD-10-CM | POA: Diagnosis not present

## 2022-05-21 DIAGNOSIS — R52 Pain, unspecified: Secondary | ICD-10-CM | POA: Diagnosis not present

## 2022-05-21 DIAGNOSIS — I4819 Other persistent atrial fibrillation: Secondary | ICD-10-CM | POA: Diagnosis not present

## 2022-05-21 DIAGNOSIS — U071 COVID-19: Secondary | ICD-10-CM | POA: Diagnosis not present

## 2022-05-21 DIAGNOSIS — Z9660 Presence of unspecified orthopedic joint implant: Secondary | ICD-10-CM | POA: Diagnosis not present

## 2022-05-21 DIAGNOSIS — H409 Unspecified glaucoma: Secondary | ICD-10-CM | POA: Diagnosis not present

## 2022-05-21 DIAGNOSIS — Z743 Need for continuous supervision: Secondary | ICD-10-CM | POA: Diagnosis not present

## 2022-05-21 DIAGNOSIS — I10 Essential (primary) hypertension: Secondary | ICD-10-CM | POA: Diagnosis not present

## 2022-05-21 DIAGNOSIS — I509 Heart failure, unspecified: Secondary | ICD-10-CM | POA: Diagnosis not present

## 2022-05-21 DIAGNOSIS — R0689 Other abnormalities of breathing: Secondary | ICD-10-CM | POA: Diagnosis not present

## 2022-05-21 DIAGNOSIS — K219 Gastro-esophageal reflux disease without esophagitis: Secondary | ICD-10-CM | POA: Diagnosis not present

## 2022-05-21 DIAGNOSIS — Z882 Allergy status to sulfonamides status: Secondary | ICD-10-CM | POA: Diagnosis not present

## 2022-05-21 DIAGNOSIS — G9341 Metabolic encephalopathy: Secondary | ICD-10-CM | POA: Diagnosis not present

## 2022-05-21 DIAGNOSIS — Z880 Allergy status to penicillin: Secondary | ICD-10-CM | POA: Diagnosis not present

## 2022-05-21 DIAGNOSIS — Z7901 Long term (current) use of anticoagulants: Secondary | ICD-10-CM | POA: Diagnosis not present

## 2022-05-21 DIAGNOSIS — R569 Unspecified convulsions: Secondary | ICD-10-CM | POA: Diagnosis not present

## 2022-05-21 DIAGNOSIS — Z881 Allergy status to other antibiotic agents status: Secondary | ICD-10-CM | POA: Diagnosis not present

## 2022-05-21 DIAGNOSIS — R6520 Severe sepsis without septic shock: Secondary | ICD-10-CM | POA: Diagnosis not present

## 2022-05-21 DIAGNOSIS — Z888 Allergy status to other drugs, medicaments and biological substances status: Secondary | ICD-10-CM | POA: Diagnosis not present

## 2022-05-21 DIAGNOSIS — Z79899 Other long term (current) drug therapy: Secondary | ICD-10-CM | POA: Diagnosis not present

## 2022-05-21 DIAGNOSIS — R54 Age-related physical debility: Secondary | ICD-10-CM | POA: Diagnosis not present

## 2022-05-21 DIAGNOSIS — R7989 Other specified abnormal findings of blood chemistry: Secondary | ICD-10-CM | POA: Diagnosis not present

## 2022-05-21 DIAGNOSIS — A4189 Other specified sepsis: Secondary | ICD-10-CM | POA: Diagnosis not present

## 2022-05-21 DIAGNOSIS — J984 Other disorders of lung: Secondary | ICD-10-CM | POA: Diagnosis not present

## 2022-05-21 DIAGNOSIS — J1282 Pneumonia due to coronavirus disease 2019: Secondary | ICD-10-CM | POA: Diagnosis not present

## 2022-05-21 DIAGNOSIS — I482 Chronic atrial fibrillation, unspecified: Secondary | ICD-10-CM | POA: Diagnosis not present

## 2022-05-21 DIAGNOSIS — R296 Repeated falls: Secondary | ICD-10-CM | POA: Diagnosis not present

## 2022-05-21 DIAGNOSIS — I951 Orthostatic hypotension: Secondary | ICD-10-CM | POA: Diagnosis not present

## 2022-05-21 DIAGNOSIS — Z792 Long term (current) use of antibiotics: Secondary | ICD-10-CM | POA: Diagnosis not present

## 2022-05-29 DIAGNOSIS — Z743 Need for continuous supervision: Secondary | ICD-10-CM | POA: Diagnosis not present

## 2022-05-29 DIAGNOSIS — Z043 Encounter for examination and observation following other accident: Secondary | ICD-10-CM | POA: Diagnosis not present

## 2022-05-29 DIAGNOSIS — R9431 Abnormal electrocardiogram [ECG] [EKG]: Secondary | ICD-10-CM | POA: Diagnosis not present

## 2022-05-29 DIAGNOSIS — S0990XA Unspecified injury of head, initial encounter: Secondary | ICD-10-CM | POA: Diagnosis not present

## 2022-05-29 DIAGNOSIS — I10 Essential (primary) hypertension: Secondary | ICD-10-CM | POA: Diagnosis not present

## 2022-05-29 DIAGNOSIS — W19XXXA Unspecified fall, initial encounter: Secondary | ICD-10-CM | POA: Diagnosis not present

## 2022-05-29 DIAGNOSIS — R531 Weakness: Secondary | ICD-10-CM | POA: Diagnosis not present

## 2022-05-29 DIAGNOSIS — R404 Transient alteration of awareness: Secondary | ICD-10-CM | POA: Diagnosis not present

## 2022-05-29 DIAGNOSIS — Z882 Allergy status to sulfonamides status: Secondary | ICD-10-CM | POA: Diagnosis not present

## 2022-05-29 DIAGNOSIS — R55 Syncope and collapse: Secondary | ICD-10-CM | POA: Diagnosis not present

## 2022-05-29 DIAGNOSIS — Z87891 Personal history of nicotine dependence: Secondary | ICD-10-CM | POA: Diagnosis not present

## 2022-05-29 DIAGNOSIS — G319 Degenerative disease of nervous system, unspecified: Secondary | ICD-10-CM | POA: Diagnosis not present

## 2022-05-29 DIAGNOSIS — R9082 White matter disease, unspecified: Secondary | ICD-10-CM | POA: Diagnosis not present

## 2022-05-29 DIAGNOSIS — R6889 Other general symptoms and signs: Secondary | ICD-10-CM | POA: Diagnosis not present

## 2022-05-29 DIAGNOSIS — Z880 Allergy status to penicillin: Secondary | ICD-10-CM | POA: Diagnosis not present

## 2022-05-29 DIAGNOSIS — E860 Dehydration: Secondary | ICD-10-CM | POA: Diagnosis not present

## 2022-05-29 DIAGNOSIS — R42 Dizziness and giddiness: Secondary | ICD-10-CM | POA: Diagnosis not present

## 2022-05-31 DIAGNOSIS — I10 Essential (primary) hypertension: Secondary | ICD-10-CM | POA: Diagnosis not present

## 2022-05-31 DIAGNOSIS — R21 Rash and other nonspecific skin eruption: Secondary | ICD-10-CM | POA: Diagnosis not present

## 2022-05-31 DIAGNOSIS — Z299 Encounter for prophylactic measures, unspecified: Secondary | ICD-10-CM | POA: Diagnosis not present

## 2022-05-31 DIAGNOSIS — B372 Candidiasis of skin and nail: Secondary | ICD-10-CM | POA: Diagnosis not present

## 2022-06-01 DIAGNOSIS — Z8616 Personal history of COVID-19: Secondary | ICD-10-CM | POA: Diagnosis not present

## 2022-06-01 DIAGNOSIS — D6869 Other thrombophilia: Secondary | ICD-10-CM | POA: Diagnosis not present

## 2022-06-01 DIAGNOSIS — Z9181 History of falling: Secondary | ICD-10-CM | POA: Diagnosis not present

## 2022-06-01 DIAGNOSIS — I700 Atherosclerosis of aorta: Secondary | ICD-10-CM | POA: Diagnosis not present

## 2022-06-01 DIAGNOSIS — D696 Thrombocytopenia, unspecified: Secondary | ICD-10-CM | POA: Diagnosis not present

## 2022-06-01 DIAGNOSIS — I4891 Unspecified atrial fibrillation: Secondary | ICD-10-CM | POA: Diagnosis not present

## 2022-06-01 DIAGNOSIS — I270 Primary pulmonary hypertension: Secondary | ICD-10-CM | POA: Diagnosis not present

## 2022-06-01 DIAGNOSIS — Z7901 Long term (current) use of anticoagulants: Secondary | ICD-10-CM | POA: Diagnosis not present

## 2022-06-01 DIAGNOSIS — I4892 Unspecified atrial flutter: Secondary | ICD-10-CM | POA: Diagnosis not present

## 2022-06-01 DIAGNOSIS — I739 Peripheral vascular disease, unspecified: Secondary | ICD-10-CM | POA: Diagnosis not present

## 2022-06-01 DIAGNOSIS — I472 Ventricular tachycardia, unspecified: Secondary | ICD-10-CM | POA: Diagnosis not present

## 2022-06-01 DIAGNOSIS — M179 Osteoarthritis of knee, unspecified: Secondary | ICD-10-CM | POA: Diagnosis not present

## 2022-06-01 DIAGNOSIS — I10 Essential (primary) hypertension: Secondary | ICD-10-CM | POA: Diagnosis not present

## 2022-06-01 DIAGNOSIS — E785 Hyperlipidemia, unspecified: Secondary | ICD-10-CM | POA: Diagnosis not present

## 2022-06-01 DIAGNOSIS — D692 Other nonthrombocytopenic purpura: Secondary | ICD-10-CM | POA: Diagnosis not present

## 2022-06-01 DIAGNOSIS — I7121 Aneurysm of the ascending aorta, without rupture: Secondary | ICD-10-CM | POA: Diagnosis not present

## 2022-06-04 ENCOUNTER — Ambulatory Visit: Payer: Medicare Other | Admitting: Internal Medicine

## 2022-06-04 DIAGNOSIS — Z8616 Personal history of COVID-19: Secondary | ICD-10-CM | POA: Diagnosis not present

## 2022-06-04 DIAGNOSIS — I739 Peripheral vascular disease, unspecified: Secondary | ICD-10-CM | POA: Diagnosis not present

## 2022-06-04 DIAGNOSIS — Z9181 History of falling: Secondary | ICD-10-CM | POA: Diagnosis not present

## 2022-06-04 DIAGNOSIS — E785 Hyperlipidemia, unspecified: Secondary | ICD-10-CM | POA: Diagnosis not present

## 2022-06-04 DIAGNOSIS — D696 Thrombocytopenia, unspecified: Secondary | ICD-10-CM | POA: Diagnosis not present

## 2022-06-04 DIAGNOSIS — I472 Ventricular tachycardia, unspecified: Secondary | ICD-10-CM | POA: Diagnosis not present

## 2022-06-04 DIAGNOSIS — I10 Essential (primary) hypertension: Secondary | ICD-10-CM | POA: Diagnosis not present

## 2022-06-04 DIAGNOSIS — I270 Primary pulmonary hypertension: Secondary | ICD-10-CM | POA: Diagnosis not present

## 2022-06-04 DIAGNOSIS — M179 Osteoarthritis of knee, unspecified: Secondary | ICD-10-CM | POA: Diagnosis not present

## 2022-06-04 DIAGNOSIS — D6869 Other thrombophilia: Secondary | ICD-10-CM | POA: Diagnosis not present

## 2022-06-04 DIAGNOSIS — I4892 Unspecified atrial flutter: Secondary | ICD-10-CM | POA: Diagnosis not present

## 2022-06-04 DIAGNOSIS — I700 Atherosclerosis of aorta: Secondary | ICD-10-CM | POA: Diagnosis not present

## 2022-06-04 DIAGNOSIS — D692 Other nonthrombocytopenic purpura: Secondary | ICD-10-CM | POA: Diagnosis not present

## 2022-06-04 DIAGNOSIS — I4891 Unspecified atrial fibrillation: Secondary | ICD-10-CM | POA: Diagnosis not present

## 2022-06-04 DIAGNOSIS — I7121 Aneurysm of the ascending aorta, without rupture: Secondary | ICD-10-CM | POA: Diagnosis not present

## 2022-06-04 DIAGNOSIS — Z7901 Long term (current) use of anticoagulants: Secondary | ICD-10-CM | POA: Diagnosis not present

## 2022-06-06 DIAGNOSIS — M179 Osteoarthritis of knee, unspecified: Secondary | ICD-10-CM | POA: Diagnosis not present

## 2022-06-06 DIAGNOSIS — I472 Ventricular tachycardia, unspecified: Secondary | ICD-10-CM | POA: Diagnosis not present

## 2022-06-06 DIAGNOSIS — I270 Primary pulmonary hypertension: Secondary | ICD-10-CM | POA: Diagnosis not present

## 2022-06-06 DIAGNOSIS — Z9181 History of falling: Secondary | ICD-10-CM | POA: Diagnosis not present

## 2022-06-06 DIAGNOSIS — E785 Hyperlipidemia, unspecified: Secondary | ICD-10-CM | POA: Diagnosis not present

## 2022-06-06 DIAGNOSIS — I4891 Unspecified atrial fibrillation: Secondary | ICD-10-CM | POA: Diagnosis not present

## 2022-06-06 DIAGNOSIS — Z8616 Personal history of COVID-19: Secondary | ICD-10-CM | POA: Diagnosis not present

## 2022-06-06 DIAGNOSIS — I7121 Aneurysm of the ascending aorta, without rupture: Secondary | ICD-10-CM | POA: Diagnosis not present

## 2022-06-06 DIAGNOSIS — D692 Other nonthrombocytopenic purpura: Secondary | ICD-10-CM | POA: Diagnosis not present

## 2022-06-06 DIAGNOSIS — I4892 Unspecified atrial flutter: Secondary | ICD-10-CM | POA: Diagnosis not present

## 2022-06-06 DIAGNOSIS — D6869 Other thrombophilia: Secondary | ICD-10-CM | POA: Diagnosis not present

## 2022-06-06 DIAGNOSIS — I700 Atherosclerosis of aorta: Secondary | ICD-10-CM | POA: Diagnosis not present

## 2022-06-06 DIAGNOSIS — Z7901 Long term (current) use of anticoagulants: Secondary | ICD-10-CM | POA: Diagnosis not present

## 2022-06-06 DIAGNOSIS — I739 Peripheral vascular disease, unspecified: Secondary | ICD-10-CM | POA: Diagnosis not present

## 2022-06-06 DIAGNOSIS — D696 Thrombocytopenia, unspecified: Secondary | ICD-10-CM | POA: Diagnosis not present

## 2022-06-06 DIAGNOSIS — I10 Essential (primary) hypertension: Secondary | ICD-10-CM | POA: Diagnosis not present

## 2022-06-07 DIAGNOSIS — I739 Peripheral vascular disease, unspecified: Secondary | ICD-10-CM | POA: Diagnosis not present

## 2022-06-07 DIAGNOSIS — I4892 Unspecified atrial flutter: Secondary | ICD-10-CM | POA: Diagnosis not present

## 2022-06-07 DIAGNOSIS — I700 Atherosclerosis of aorta: Secondary | ICD-10-CM | POA: Diagnosis not present

## 2022-06-07 DIAGNOSIS — I4891 Unspecified atrial fibrillation: Secondary | ICD-10-CM | POA: Diagnosis not present

## 2022-06-07 DIAGNOSIS — I10 Essential (primary) hypertension: Secondary | ICD-10-CM | POA: Diagnosis not present

## 2022-06-07 DIAGNOSIS — Z9181 History of falling: Secondary | ICD-10-CM | POA: Diagnosis not present

## 2022-06-07 DIAGNOSIS — Z7901 Long term (current) use of anticoagulants: Secondary | ICD-10-CM | POA: Diagnosis not present

## 2022-06-07 DIAGNOSIS — Z8616 Personal history of COVID-19: Secondary | ICD-10-CM | POA: Diagnosis not present

## 2022-06-07 DIAGNOSIS — D696 Thrombocytopenia, unspecified: Secondary | ICD-10-CM | POA: Diagnosis not present

## 2022-06-07 DIAGNOSIS — I7121 Aneurysm of the ascending aorta, without rupture: Secondary | ICD-10-CM | POA: Diagnosis not present

## 2022-06-07 DIAGNOSIS — I270 Primary pulmonary hypertension: Secondary | ICD-10-CM | POA: Diagnosis not present

## 2022-06-07 DIAGNOSIS — E785 Hyperlipidemia, unspecified: Secondary | ICD-10-CM | POA: Diagnosis not present

## 2022-06-07 DIAGNOSIS — D692 Other nonthrombocytopenic purpura: Secondary | ICD-10-CM | POA: Diagnosis not present

## 2022-06-07 DIAGNOSIS — M179 Osteoarthritis of knee, unspecified: Secondary | ICD-10-CM | POA: Diagnosis not present

## 2022-06-07 DIAGNOSIS — I472 Ventricular tachycardia, unspecified: Secondary | ICD-10-CM | POA: Diagnosis not present

## 2022-06-07 DIAGNOSIS — D6869 Other thrombophilia: Secondary | ICD-10-CM | POA: Diagnosis not present

## 2022-06-12 DIAGNOSIS — I10 Essential (primary) hypertension: Secondary | ICD-10-CM | POA: Diagnosis not present

## 2022-06-12 DIAGNOSIS — Z8616 Personal history of COVID-19: Secondary | ICD-10-CM | POA: Diagnosis not present

## 2022-06-12 DIAGNOSIS — D6869 Other thrombophilia: Secondary | ICD-10-CM | POA: Diagnosis not present

## 2022-06-12 DIAGNOSIS — D696 Thrombocytopenia, unspecified: Secondary | ICD-10-CM | POA: Diagnosis not present

## 2022-06-12 DIAGNOSIS — E785 Hyperlipidemia, unspecified: Secondary | ICD-10-CM | POA: Diagnosis not present

## 2022-06-12 DIAGNOSIS — I4891 Unspecified atrial fibrillation: Secondary | ICD-10-CM | POA: Diagnosis not present

## 2022-06-12 DIAGNOSIS — I739 Peripheral vascular disease, unspecified: Secondary | ICD-10-CM | POA: Diagnosis not present

## 2022-06-12 DIAGNOSIS — I4892 Unspecified atrial flutter: Secondary | ICD-10-CM | POA: Diagnosis not present

## 2022-06-12 DIAGNOSIS — M179 Osteoarthritis of knee, unspecified: Secondary | ICD-10-CM | POA: Diagnosis not present

## 2022-06-12 DIAGNOSIS — I700 Atherosclerosis of aorta: Secondary | ICD-10-CM | POA: Diagnosis not present

## 2022-06-12 DIAGNOSIS — I472 Ventricular tachycardia, unspecified: Secondary | ICD-10-CM | POA: Diagnosis not present

## 2022-06-12 DIAGNOSIS — I270 Primary pulmonary hypertension: Secondary | ICD-10-CM | POA: Diagnosis not present

## 2022-06-12 DIAGNOSIS — Z9181 History of falling: Secondary | ICD-10-CM | POA: Diagnosis not present

## 2022-06-12 DIAGNOSIS — D692 Other nonthrombocytopenic purpura: Secondary | ICD-10-CM | POA: Diagnosis not present

## 2022-06-12 DIAGNOSIS — I7121 Aneurysm of the ascending aorta, without rupture: Secondary | ICD-10-CM | POA: Diagnosis not present

## 2022-06-12 DIAGNOSIS — Z7901 Long term (current) use of anticoagulants: Secondary | ICD-10-CM | POA: Diagnosis not present

## 2022-06-14 DIAGNOSIS — I472 Ventricular tachycardia, unspecified: Secondary | ICD-10-CM | POA: Diagnosis not present

## 2022-06-14 DIAGNOSIS — I639 Cerebral infarction, unspecified: Secondary | ICD-10-CM | POA: Diagnosis not present

## 2022-06-14 DIAGNOSIS — M179 Osteoarthritis of knee, unspecified: Secondary | ICD-10-CM | POA: Diagnosis not present

## 2022-06-14 DIAGNOSIS — E785 Hyperlipidemia, unspecified: Secondary | ICD-10-CM | POA: Diagnosis not present

## 2022-06-14 DIAGNOSIS — I739 Peripheral vascular disease, unspecified: Secondary | ICD-10-CM | POA: Diagnosis not present

## 2022-06-14 DIAGNOSIS — Z8616 Personal history of COVID-19: Secondary | ICD-10-CM | POA: Diagnosis not present

## 2022-06-14 DIAGNOSIS — Z882 Allergy status to sulfonamides status: Secondary | ICD-10-CM | POA: Diagnosis not present

## 2022-06-14 DIAGNOSIS — S0081XA Abrasion of other part of head, initial encounter: Secondary | ICD-10-CM | POA: Diagnosis not present

## 2022-06-14 DIAGNOSIS — I270 Primary pulmonary hypertension: Secondary | ICD-10-CM | POA: Diagnosis not present

## 2022-06-14 DIAGNOSIS — Z7901 Long term (current) use of anticoagulants: Secondary | ICD-10-CM | POA: Diagnosis not present

## 2022-06-14 DIAGNOSIS — I700 Atherosclerosis of aorta: Secondary | ICD-10-CM | POA: Diagnosis not present

## 2022-06-14 DIAGNOSIS — I7121 Aneurysm of the ascending aorta, without rupture: Secondary | ICD-10-CM | POA: Diagnosis not present

## 2022-06-14 DIAGNOSIS — I6782 Cerebral ischemia: Secondary | ICD-10-CM | POA: Diagnosis not present

## 2022-06-14 DIAGNOSIS — Z79899 Other long term (current) drug therapy: Secondary | ICD-10-CM | POA: Diagnosis not present

## 2022-06-14 DIAGNOSIS — I10 Essential (primary) hypertension: Secondary | ICD-10-CM | POA: Diagnosis not present

## 2022-06-14 DIAGNOSIS — Z743 Need for continuous supervision: Secondary | ICD-10-CM | POA: Diagnosis not present

## 2022-06-14 DIAGNOSIS — Z9181 History of falling: Secondary | ICD-10-CM | POA: Diagnosis not present

## 2022-06-14 DIAGNOSIS — D696 Thrombocytopenia, unspecified: Secondary | ICD-10-CM | POA: Diagnosis not present

## 2022-06-14 DIAGNOSIS — I4892 Unspecified atrial flutter: Secondary | ICD-10-CM | POA: Diagnosis not present

## 2022-06-14 DIAGNOSIS — D6869 Other thrombophilia: Secondary | ICD-10-CM | POA: Diagnosis not present

## 2022-06-14 DIAGNOSIS — I4891 Unspecified atrial fibrillation: Secondary | ICD-10-CM | POA: Diagnosis not present

## 2022-06-14 DIAGNOSIS — Z880 Allergy status to penicillin: Secondary | ICD-10-CM | POA: Diagnosis not present

## 2022-06-14 DIAGNOSIS — Z87891 Personal history of nicotine dependence: Secondary | ICD-10-CM | POA: Diagnosis not present

## 2022-06-14 DIAGNOSIS — W1839XA Other fall on same level, initial encounter: Secondary | ICD-10-CM | POA: Diagnosis not present

## 2022-06-14 DIAGNOSIS — R6889 Other general symptoms and signs: Secondary | ICD-10-CM | POA: Diagnosis not present

## 2022-06-14 DIAGNOSIS — D692 Other nonthrombocytopenic purpura: Secondary | ICD-10-CM | POA: Diagnosis not present

## 2022-06-21 DIAGNOSIS — I10 Essential (primary) hypertension: Secondary | ICD-10-CM | POA: Diagnosis not present

## 2022-06-21 DIAGNOSIS — Z299 Encounter for prophylactic measures, unspecified: Secondary | ICD-10-CM | POA: Diagnosis not present

## 2022-06-21 DIAGNOSIS — R296 Repeated falls: Secondary | ICD-10-CM | POA: Diagnosis not present

## 2022-06-21 DIAGNOSIS — I4891 Unspecified atrial fibrillation: Secondary | ICD-10-CM | POA: Diagnosis not present

## 2022-06-28 DIAGNOSIS — I270 Primary pulmonary hypertension: Secondary | ICD-10-CM | POA: Diagnosis not present

## 2022-06-28 DIAGNOSIS — Z299 Encounter for prophylactic measures, unspecified: Secondary | ICD-10-CM | POA: Diagnosis not present

## 2022-06-28 DIAGNOSIS — I700 Atherosclerosis of aorta: Secondary | ICD-10-CM | POA: Diagnosis not present

## 2022-06-28 DIAGNOSIS — I10 Essential (primary) hypertension: Secondary | ICD-10-CM | POA: Diagnosis not present

## 2022-06-28 DIAGNOSIS — R296 Repeated falls: Secondary | ICD-10-CM | POA: Diagnosis not present

## 2022-06-30 DIAGNOSIS — T402X5A Adverse effect of other opioids, initial encounter: Secondary | ICD-10-CM | POA: Diagnosis not present

## 2022-06-30 DIAGNOSIS — I4891 Unspecified atrial fibrillation: Secondary | ICD-10-CM | POA: Diagnosis not present

## 2022-06-30 DIAGNOSIS — Z7901 Long term (current) use of anticoagulants: Secondary | ICD-10-CM | POA: Diagnosis not present

## 2022-06-30 DIAGNOSIS — R531 Weakness: Secondary | ICD-10-CM | POA: Diagnosis not present

## 2022-06-30 DIAGNOSIS — Z888 Allergy status to other drugs, medicaments and biological substances status: Secondary | ICD-10-CM | POA: Diagnosis not present

## 2022-06-30 DIAGNOSIS — Z79899 Other long term (current) drug therapy: Secondary | ICD-10-CM | POA: Diagnosis not present

## 2022-06-30 DIAGNOSIS — I10 Essential (primary) hypertension: Secondary | ICD-10-CM | POA: Diagnosis not present

## 2022-06-30 DIAGNOSIS — N390 Urinary tract infection, site not specified: Secondary | ICD-10-CM | POA: Diagnosis not present

## 2022-06-30 DIAGNOSIS — Z881 Allergy status to other antibiotic agents status: Secondary | ICD-10-CM | POA: Diagnosis not present

## 2022-06-30 DIAGNOSIS — E785 Hyperlipidemia, unspecified: Secondary | ICD-10-CM | POA: Diagnosis not present

## 2022-06-30 DIAGNOSIS — Z743 Need for continuous supervision: Secondary | ICD-10-CM | POA: Diagnosis not present

## 2022-06-30 DIAGNOSIS — R5381 Other malaise: Secondary | ICD-10-CM | POA: Diagnosis not present

## 2022-06-30 DIAGNOSIS — Z87891 Personal history of nicotine dependence: Secondary | ICD-10-CM | POA: Diagnosis not present

## 2022-06-30 DIAGNOSIS — Z880 Allergy status to penicillin: Secondary | ICD-10-CM | POA: Diagnosis not present

## 2022-06-30 DIAGNOSIS — Z882 Allergy status to sulfonamides status: Secondary | ICD-10-CM | POA: Diagnosis not present

## 2022-07-03 DIAGNOSIS — Z79899 Other long term (current) drug therapy: Secondary | ICD-10-CM | POA: Diagnosis not present

## 2022-07-03 DIAGNOSIS — I10 Essential (primary) hypertension: Secondary | ICD-10-CM | POA: Diagnosis not present

## 2022-07-03 DIAGNOSIS — Z299 Encounter for prophylactic measures, unspecified: Secondary | ICD-10-CM | POA: Diagnosis not present

## 2022-07-08 DIAGNOSIS — M179 Osteoarthritis of knee, unspecified: Secondary | ICD-10-CM | POA: Diagnosis not present

## 2022-07-09 DIAGNOSIS — R1310 Dysphagia, unspecified: Secondary | ICD-10-CM | POA: Diagnosis not present

## 2022-07-09 DIAGNOSIS — K219 Gastro-esophageal reflux disease without esophagitis: Secondary | ICD-10-CM | POA: Diagnosis not present

## 2022-07-09 DIAGNOSIS — T148XXD Other injury of unspecified body region, subsequent encounter: Secondary | ICD-10-CM | POA: Diagnosis not present

## 2022-07-09 DIAGNOSIS — K4090 Unilateral inguinal hernia, without obstruction or gangrene, not specified as recurrent: Secondary | ICD-10-CM | POA: Diagnosis not present

## 2022-07-09 DIAGNOSIS — Z880 Allergy status to penicillin: Secondary | ICD-10-CM | POA: Diagnosis not present

## 2022-07-09 DIAGNOSIS — J951 Acute pulmonary insufficiency following thoracic surgery: Secondary | ICD-10-CM | POA: Diagnosis not present

## 2022-07-09 DIAGNOSIS — S32009A Unspecified fracture of unspecified lumbar vertebra, initial encounter for closed fracture: Secondary | ICD-10-CM | POA: Diagnosis not present

## 2022-07-09 DIAGNOSIS — I2510 Atherosclerotic heart disease of native coronary artery without angina pectoris: Secondary | ICD-10-CM | POA: Diagnosis not present

## 2022-07-09 DIAGNOSIS — Z7901 Long term (current) use of anticoagulants: Secondary | ICD-10-CM | POA: Diagnosis not present

## 2022-07-09 DIAGNOSIS — E041 Nontoxic single thyroid nodule: Secondary | ICD-10-CM | POA: Diagnosis not present

## 2022-07-09 DIAGNOSIS — H409 Unspecified glaucoma: Secondary | ICD-10-CM | POA: Diagnosis not present

## 2022-07-09 DIAGNOSIS — I10 Essential (primary) hypertension: Secondary | ICD-10-CM | POA: Diagnosis not present

## 2022-07-09 DIAGNOSIS — Z79899 Other long term (current) drug therapy: Secondary | ICD-10-CM | POA: Diagnosis not present

## 2022-07-09 DIAGNOSIS — Z8673 Personal history of transient ischemic attack (TIA), and cerebral infarction without residual deficits: Secondary | ICD-10-CM | POA: Diagnosis not present

## 2022-07-09 DIAGNOSIS — S2243XA Multiple fractures of ribs, bilateral, initial encounter for closed fracture: Secondary | ICD-10-CM | POA: Diagnosis not present

## 2022-07-09 DIAGNOSIS — R569 Unspecified convulsions: Secondary | ICD-10-CM | POA: Diagnosis not present

## 2022-07-09 DIAGNOSIS — R1032 Left lower quadrant pain: Secondary | ICD-10-CM | POA: Diagnosis not present

## 2022-07-09 DIAGNOSIS — T07XXXD Unspecified multiple injuries, subsequent encounter: Secondary | ICD-10-CM | POA: Diagnosis not present

## 2022-07-09 DIAGNOSIS — Z743 Need for continuous supervision: Secondary | ICD-10-CM | POA: Diagnosis not present

## 2022-07-09 DIAGNOSIS — Z87891 Personal history of nicotine dependence: Secondary | ICD-10-CM | POA: Diagnosis not present

## 2022-07-09 DIAGNOSIS — R0902 Hypoxemia: Secondary | ICD-10-CM | POA: Diagnosis not present

## 2022-07-09 DIAGNOSIS — S32029A Unspecified fracture of second lumbar vertebra, initial encounter for closed fracture: Secondary | ICD-10-CM | POA: Diagnosis not present

## 2022-07-09 DIAGNOSIS — R2689 Other abnormalities of gait and mobility: Secondary | ICD-10-CM | POA: Diagnosis not present

## 2022-07-09 DIAGNOSIS — R627 Adult failure to thrive: Secondary | ICD-10-CM | POA: Diagnosis not present

## 2022-07-09 DIAGNOSIS — N1330 Unspecified hydronephrosis: Secondary | ICD-10-CM | POA: Diagnosis not present

## 2022-07-09 DIAGNOSIS — E785 Hyperlipidemia, unspecified: Secondary | ICD-10-CM | POA: Diagnosis not present

## 2022-07-09 DIAGNOSIS — M4312 Spondylolisthesis, cervical region: Secondary | ICD-10-CM | POA: Diagnosis not present

## 2022-07-09 DIAGNOSIS — S2241XA Multiple fractures of ribs, right side, initial encounter for closed fracture: Secondary | ICD-10-CM | POA: Diagnosis not present

## 2022-07-09 DIAGNOSIS — M6281 Muscle weakness (generalized): Secondary | ICD-10-CM | POA: Diagnosis not present

## 2022-07-09 DIAGNOSIS — R7989 Other specified abnormal findings of blood chemistry: Secondary | ICD-10-CM | POA: Diagnosis not present

## 2022-07-09 DIAGNOSIS — M47812 Spondylosis without myelopathy or radiculopathy, cervical region: Secondary | ICD-10-CM | POA: Diagnosis not present

## 2022-07-09 DIAGNOSIS — I700 Atherosclerosis of aorta: Secondary | ICD-10-CM | POA: Diagnosis not present

## 2022-07-09 DIAGNOSIS — J90 Pleural effusion, not elsewhere classified: Secondary | ICD-10-CM | POA: Diagnosis not present

## 2022-07-09 DIAGNOSIS — I672 Cerebral atherosclerosis: Secondary | ICD-10-CM | POA: Diagnosis not present

## 2022-07-09 DIAGNOSIS — S32020A Wedge compression fracture of second lumbar vertebra, initial encounter for closed fracture: Secondary | ICD-10-CM | POA: Diagnosis not present

## 2022-07-09 DIAGNOSIS — W19XXXA Unspecified fall, initial encounter: Secondary | ICD-10-CM | POA: Diagnosis not present

## 2022-07-09 DIAGNOSIS — Z881 Allergy status to other antibiotic agents status: Secondary | ICD-10-CM | POA: Diagnosis not present

## 2022-07-09 DIAGNOSIS — I4891 Unspecified atrial fibrillation: Secondary | ICD-10-CM | POA: Diagnosis not present

## 2022-07-09 DIAGNOSIS — R109 Unspecified abdominal pain: Secondary | ICD-10-CM | POA: Diagnosis not present

## 2022-07-09 DIAGNOSIS — Z7982 Long term (current) use of aspirin: Secondary | ICD-10-CM | POA: Diagnosis not present

## 2022-07-09 DIAGNOSIS — F02C0 Dementia in other diseases classified elsewhere, severe, without behavioral disturbance, psychotic disturbance, mood disturbance, and anxiety: Secondary | ICD-10-CM | POA: Diagnosis not present

## 2022-07-09 DIAGNOSIS — S32020D Wedge compression fracture of second lumbar vertebra, subsequent encounter for fracture with routine healing: Secondary | ICD-10-CM | POA: Diagnosis not present

## 2022-07-09 DIAGNOSIS — G309 Alzheimer's disease, unspecified: Secondary | ICD-10-CM | POA: Diagnosis not present

## 2022-07-09 DIAGNOSIS — R609 Edema, unspecified: Secondary | ICD-10-CM | POA: Diagnosis not present

## 2022-07-09 DIAGNOSIS — S3991XA Unspecified injury of abdomen, initial encounter: Secondary | ICD-10-CM | POA: Diagnosis not present

## 2022-07-09 DIAGNOSIS — W1839XA Other fall on same level, initial encounter: Secondary | ICD-10-CM | POA: Diagnosis not present

## 2022-07-09 DIAGNOSIS — J984 Other disorders of lung: Secondary | ICD-10-CM | POA: Diagnosis not present

## 2022-07-09 DIAGNOSIS — Z7401 Bed confinement status: Secondary | ICD-10-CM | POA: Diagnosis not present

## 2022-07-09 DIAGNOSIS — R296 Repeated falls: Secondary | ICD-10-CM | POA: Diagnosis not present

## 2022-07-09 DIAGNOSIS — I739 Peripheral vascular disease, unspecified: Secondary | ICD-10-CM | POA: Diagnosis not present

## 2022-07-09 DIAGNOSIS — Z882 Allergy status to sulfonamides status: Secondary | ICD-10-CM | POA: Diagnosis not present

## 2022-07-09 DIAGNOSIS — N201 Calculus of ureter: Secondary | ICD-10-CM | POA: Diagnosis not present

## 2022-07-10 DIAGNOSIS — T07XXXD Unspecified multiple injuries, subsequent encounter: Secondary | ICD-10-CM | POA: Diagnosis not present

## 2022-07-10 DIAGNOSIS — R296 Repeated falls: Secondary | ICD-10-CM | POA: Diagnosis not present

## 2022-07-10 DIAGNOSIS — Z79899 Other long term (current) drug therapy: Secondary | ICD-10-CM | POA: Diagnosis not present

## 2022-07-10 DIAGNOSIS — R569 Unspecified convulsions: Secondary | ICD-10-CM | POA: Diagnosis not present

## 2022-07-10 DIAGNOSIS — I4891 Unspecified atrial fibrillation: Secondary | ICD-10-CM | POA: Diagnosis not present

## 2022-07-10 DIAGNOSIS — Z7982 Long term (current) use of aspirin: Secondary | ICD-10-CM | POA: Diagnosis not present

## 2022-07-10 DIAGNOSIS — R7989 Other specified abnormal findings of blood chemistry: Secondary | ICD-10-CM | POA: Diagnosis not present

## 2022-07-11 DIAGNOSIS — R569 Unspecified convulsions: Secondary | ICD-10-CM | POA: Diagnosis not present

## 2022-07-11 DIAGNOSIS — Z79899 Other long term (current) drug therapy: Secondary | ICD-10-CM | POA: Diagnosis not present

## 2022-07-11 DIAGNOSIS — T07XXXD Unspecified multiple injuries, subsequent encounter: Secondary | ICD-10-CM | POA: Diagnosis not present

## 2022-07-11 DIAGNOSIS — R296 Repeated falls: Secondary | ICD-10-CM | POA: Diagnosis not present

## 2022-07-11 DIAGNOSIS — I4891 Unspecified atrial fibrillation: Secondary | ICD-10-CM | POA: Diagnosis not present

## 2022-07-12 DIAGNOSIS — I4891 Unspecified atrial fibrillation: Secondary | ICD-10-CM | POA: Diagnosis not present

## 2022-07-12 DIAGNOSIS — Z79899 Other long term (current) drug therapy: Secondary | ICD-10-CM | POA: Diagnosis not present

## 2022-07-12 DIAGNOSIS — R296 Repeated falls: Secondary | ICD-10-CM | POA: Diagnosis not present

## 2022-07-12 DIAGNOSIS — T148XXD Other injury of unspecified body region, subsequent encounter: Secondary | ICD-10-CM | POA: Diagnosis not present

## 2022-07-12 DIAGNOSIS — R627 Adult failure to thrive: Secondary | ICD-10-CM | POA: Diagnosis not present

## 2022-07-12 DIAGNOSIS — R569 Unspecified convulsions: Secondary | ICD-10-CM | POA: Diagnosis not present

## 2022-07-13 DIAGNOSIS — W19XXXA Unspecified fall, initial encounter: Secondary | ICD-10-CM | POA: Diagnosis not present

## 2022-07-13 DIAGNOSIS — T07XXXD Unspecified multiple injuries, subsequent encounter: Secondary | ICD-10-CM | POA: Diagnosis not present

## 2022-07-13 DIAGNOSIS — M47812 Spondylosis without myelopathy or radiculopathy, cervical region: Secondary | ICD-10-CM | POA: Diagnosis not present

## 2022-07-13 DIAGNOSIS — R42 Dizziness and giddiness: Secondary | ICD-10-CM | POA: Diagnosis not present

## 2022-07-13 DIAGNOSIS — Z7401 Bed confinement status: Secondary | ICD-10-CM | POA: Diagnosis not present

## 2022-07-13 DIAGNOSIS — Z87891 Personal history of nicotine dependence: Secondary | ICD-10-CM | POA: Diagnosis not present

## 2022-07-13 DIAGNOSIS — R569 Unspecified convulsions: Secondary | ICD-10-CM | POA: Diagnosis not present

## 2022-07-13 DIAGNOSIS — I951 Orthostatic hypotension: Secondary | ICD-10-CM | POA: Diagnosis not present

## 2022-07-13 DIAGNOSIS — Z8673 Personal history of transient ischemic attack (TIA), and cerebral infarction without residual deficits: Secondary | ICD-10-CM | POA: Diagnosis not present

## 2022-07-13 DIAGNOSIS — S32020A Wedge compression fracture of second lumbar vertebra, initial encounter for closed fracture: Secondary | ICD-10-CM | POA: Diagnosis not present

## 2022-07-13 DIAGNOSIS — Z79899 Other long term (current) drug therapy: Secondary | ICD-10-CM | POA: Diagnosis not present

## 2022-07-13 DIAGNOSIS — S0990XA Unspecified injury of head, initial encounter: Secondary | ICD-10-CM | POA: Diagnosis not present

## 2022-07-13 DIAGNOSIS — I4891 Unspecified atrial fibrillation: Secondary | ICD-10-CM | POA: Diagnosis not present

## 2022-07-13 DIAGNOSIS — N179 Acute kidney failure, unspecified: Secondary | ICD-10-CM | POA: Diagnosis not present

## 2022-07-13 DIAGNOSIS — I252 Old myocardial infarction: Secondary | ICD-10-CM | POA: Diagnosis not present

## 2022-07-13 DIAGNOSIS — Z743 Need for continuous supervision: Secondary | ICD-10-CM | POA: Diagnosis not present

## 2022-07-13 DIAGNOSIS — J951 Acute pulmonary insufficiency following thoracic surgery: Secondary | ICD-10-CM | POA: Diagnosis not present

## 2022-07-13 DIAGNOSIS — S32029A Unspecified fracture of second lumbar vertebra, initial encounter for closed fracture: Secondary | ICD-10-CM | POA: Diagnosis not present

## 2022-07-13 DIAGNOSIS — U071 COVID-19: Secondary | ICD-10-CM | POA: Diagnosis not present

## 2022-07-13 DIAGNOSIS — R0902 Hypoxemia: Secondary | ICD-10-CM | POA: Diagnosis not present

## 2022-07-13 DIAGNOSIS — I110 Hypertensive heart disease with heart failure: Secondary | ICD-10-CM | POA: Diagnosis not present

## 2022-07-13 DIAGNOSIS — S2243XA Multiple fractures of ribs, bilateral, initial encounter for closed fracture: Secondary | ICD-10-CM | POA: Diagnosis not present

## 2022-07-13 DIAGNOSIS — I440 Atrioventricular block, first degree: Secondary | ICD-10-CM | POA: Diagnosis not present

## 2022-07-13 DIAGNOSIS — Z880 Allergy status to penicillin: Secondary | ICD-10-CM | POA: Diagnosis not present

## 2022-07-13 DIAGNOSIS — I5032 Chronic diastolic (congestive) heart failure: Secondary | ICD-10-CM | POA: Diagnosis not present

## 2022-07-13 DIAGNOSIS — E782 Mixed hyperlipidemia: Secondary | ICD-10-CM | POA: Diagnosis not present

## 2022-07-13 DIAGNOSIS — Z882 Allergy status to sulfonamides status: Secondary | ICD-10-CM | POA: Diagnosis not present

## 2022-07-13 DIAGNOSIS — G319 Degenerative disease of nervous system, unspecified: Secondary | ICD-10-CM | POA: Diagnosis not present

## 2022-07-13 DIAGNOSIS — I4892 Unspecified atrial flutter: Secondary | ICD-10-CM | POA: Diagnosis not present

## 2022-07-13 DIAGNOSIS — I2510 Atherosclerotic heart disease of native coronary artery without angina pectoris: Secondary | ICD-10-CM | POA: Diagnosis not present

## 2022-07-13 DIAGNOSIS — I10 Essential (primary) hypertension: Secondary | ICD-10-CM | POA: Diagnosis not present

## 2022-07-13 DIAGNOSIS — S32020D Wedge compression fracture of second lumbar vertebra, subsequent encounter for fracture with routine healing: Secondary | ICD-10-CM | POA: Diagnosis not present

## 2022-07-13 DIAGNOSIS — R2689 Other abnormalities of gait and mobility: Secondary | ICD-10-CM | POA: Diagnosis not present

## 2022-07-13 DIAGNOSIS — I428 Other cardiomyopathies: Secondary | ICD-10-CM | POA: Diagnosis not present

## 2022-07-13 DIAGNOSIS — E785 Hyperlipidemia, unspecified: Secondary | ICD-10-CM | POA: Diagnosis not present

## 2022-07-13 DIAGNOSIS — E559 Vitamin D deficiency, unspecified: Secondary | ICD-10-CM | POA: Diagnosis not present

## 2022-07-13 DIAGNOSIS — I739 Peripheral vascular disease, unspecified: Secondary | ICD-10-CM | POA: Diagnosis not present

## 2022-07-13 DIAGNOSIS — Z8679 Personal history of other diseases of the circulatory system: Secondary | ICD-10-CM | POA: Diagnosis not present

## 2022-07-13 DIAGNOSIS — M6281 Muscle weakness (generalized): Secondary | ICD-10-CM | POA: Diagnosis not present

## 2022-07-13 DIAGNOSIS — K219 Gastro-esophageal reflux disease without esophagitis: Secondary | ICD-10-CM | POA: Diagnosis not present

## 2022-07-13 DIAGNOSIS — R079 Chest pain, unspecified: Secondary | ICD-10-CM | POA: Diagnosis not present

## 2022-07-13 DIAGNOSIS — T148XXD Other injury of unspecified body region, subsequent encounter: Secondary | ICD-10-CM | POA: Diagnosis not present

## 2022-07-13 DIAGNOSIS — S2249XA Multiple fractures of ribs, unspecified side, initial encounter for closed fracture: Secondary | ICD-10-CM | POA: Diagnosis not present

## 2022-07-13 DIAGNOSIS — R7401 Elevation of levels of liver transaminase levels: Secondary | ICD-10-CM | POA: Diagnosis not present

## 2022-07-13 DIAGNOSIS — T148XXA Other injury of unspecified body region, initial encounter: Secondary | ICD-10-CM | POA: Diagnosis not present

## 2022-07-13 DIAGNOSIS — R5381 Other malaise: Secondary | ICD-10-CM | POA: Diagnosis not present

## 2022-07-13 DIAGNOSIS — I509 Heart failure, unspecified: Secondary | ICD-10-CM | POA: Diagnosis not present

## 2022-07-13 DIAGNOSIS — R54 Age-related physical debility: Secondary | ICD-10-CM | POA: Diagnosis not present

## 2022-07-13 DIAGNOSIS — R296 Repeated falls: Secondary | ICD-10-CM | POA: Diagnosis not present

## 2022-07-13 DIAGNOSIS — R7989 Other specified abnormal findings of blood chemistry: Secondary | ICD-10-CM | POA: Diagnosis not present

## 2022-07-13 DIAGNOSIS — Z881 Allergy status to other antibiotic agents status: Secondary | ICD-10-CM | POA: Diagnosis not present

## 2022-07-13 DIAGNOSIS — H409 Unspecified glaucoma: Secondary | ICD-10-CM | POA: Diagnosis not present

## 2022-07-13 DIAGNOSIS — G309 Alzheimer's disease, unspecified: Secondary | ICD-10-CM | POA: Diagnosis not present

## 2022-07-13 DIAGNOSIS — S2242XA Multiple fractures of ribs, left side, initial encounter for closed fracture: Secondary | ICD-10-CM | POA: Diagnosis not present

## 2022-07-13 DIAGNOSIS — R1310 Dysphagia, unspecified: Secondary | ICD-10-CM | POA: Diagnosis not present

## 2022-07-13 DIAGNOSIS — S32000A Wedge compression fracture of unspecified lumbar vertebra, initial encounter for closed fracture: Secondary | ICD-10-CM | POA: Diagnosis not present

## 2022-07-13 DIAGNOSIS — S199XXA Unspecified injury of neck, initial encounter: Secondary | ICD-10-CM | POA: Diagnosis not present

## 2022-07-16 DIAGNOSIS — I2510 Atherosclerotic heart disease of native coronary artery without angina pectoris: Secondary | ICD-10-CM | POA: Diagnosis not present

## 2022-07-16 DIAGNOSIS — R569 Unspecified convulsions: Secondary | ICD-10-CM | POA: Diagnosis not present

## 2022-07-16 DIAGNOSIS — R296 Repeated falls: Secondary | ICD-10-CM | POA: Diagnosis not present

## 2022-07-16 DIAGNOSIS — E785 Hyperlipidemia, unspecified: Secondary | ICD-10-CM | POA: Diagnosis not present

## 2022-07-16 DIAGNOSIS — R42 Dizziness and giddiness: Secondary | ICD-10-CM | POA: Diagnosis not present

## 2022-07-16 DIAGNOSIS — I10 Essential (primary) hypertension: Secondary | ICD-10-CM | POA: Diagnosis not present

## 2022-07-16 DIAGNOSIS — S32029A Unspecified fracture of second lumbar vertebra, initial encounter for closed fracture: Secondary | ICD-10-CM | POA: Diagnosis not present

## 2022-07-16 DIAGNOSIS — S2249XA Multiple fractures of ribs, unspecified side, initial encounter for closed fracture: Secondary | ICD-10-CM | POA: Diagnosis not present

## 2022-07-16 DIAGNOSIS — I739 Peripheral vascular disease, unspecified: Secondary | ICD-10-CM | POA: Diagnosis not present

## 2022-07-16 DIAGNOSIS — I4891 Unspecified atrial fibrillation: Secondary | ICD-10-CM | POA: Diagnosis not present

## 2022-07-17 DIAGNOSIS — S2249XA Multiple fractures of ribs, unspecified side, initial encounter for closed fracture: Secondary | ICD-10-CM | POA: Diagnosis not present

## 2022-07-17 DIAGNOSIS — S32029A Unspecified fracture of second lumbar vertebra, initial encounter for closed fracture: Secondary | ICD-10-CM | POA: Diagnosis not present

## 2022-07-18 DIAGNOSIS — M6281 Muscle weakness (generalized): Secondary | ICD-10-CM | POA: Diagnosis not present

## 2022-07-18 DIAGNOSIS — I509 Heart failure, unspecified: Secondary | ICD-10-CM | POA: Diagnosis not present

## 2022-07-18 DIAGNOSIS — E559 Vitamin D deficiency, unspecified: Secondary | ICD-10-CM | POA: Diagnosis not present

## 2022-07-19 DIAGNOSIS — I509 Heart failure, unspecified: Secondary | ICD-10-CM | POA: Diagnosis not present

## 2022-07-20 DIAGNOSIS — S32000A Wedge compression fracture of unspecified lumbar vertebra, initial encounter for closed fracture: Secondary | ICD-10-CM | POA: Diagnosis not present

## 2022-07-21 DIAGNOSIS — I2510 Atherosclerotic heart disease of native coronary artery without angina pectoris: Secondary | ICD-10-CM | POA: Diagnosis not present

## 2022-07-21 DIAGNOSIS — M6281 Muscle weakness (generalized): Secondary | ICD-10-CM | POA: Diagnosis not present

## 2022-07-21 DIAGNOSIS — S2243XA Multiple fractures of ribs, bilateral, initial encounter for closed fracture: Secondary | ICD-10-CM | POA: Diagnosis not present

## 2022-07-21 DIAGNOSIS — Z7401 Bed confinement status: Secondary | ICD-10-CM | POA: Diagnosis not present

## 2022-07-21 DIAGNOSIS — I10 Essential (primary) hypertension: Secondary | ICD-10-CM | POA: Diagnosis not present

## 2022-07-21 DIAGNOSIS — S2242XA Multiple fractures of ribs, left side, initial encounter for closed fracture: Secondary | ICD-10-CM | POA: Diagnosis not present

## 2022-07-21 DIAGNOSIS — I4891 Unspecified atrial fibrillation: Secondary | ICD-10-CM | POA: Diagnosis not present

## 2022-07-21 DIAGNOSIS — E782 Mixed hyperlipidemia: Secondary | ICD-10-CM | POA: Diagnosis not present

## 2022-07-21 DIAGNOSIS — I428 Other cardiomyopathies: Secondary | ICD-10-CM | POA: Diagnosis not present

## 2022-07-21 DIAGNOSIS — I4811 Longstanding persistent atrial fibrillation: Secondary | ICD-10-CM | POA: Diagnosis not present

## 2022-07-21 DIAGNOSIS — Z882 Allergy status to sulfonamides status: Secondary | ICD-10-CM | POA: Diagnosis not present

## 2022-07-21 DIAGNOSIS — R279 Unspecified lack of coordination: Secondary | ICD-10-CM | POA: Diagnosis not present

## 2022-07-21 DIAGNOSIS — I739 Peripheral vascular disease, unspecified: Secondary | ICD-10-CM | POA: Diagnosis not present

## 2022-07-21 DIAGNOSIS — R569 Unspecified convulsions: Secondary | ICD-10-CM | POA: Diagnosis not present

## 2022-07-21 DIAGNOSIS — Z8673 Personal history of transient ischemic attack (TIA), and cerebral infarction without residual deficits: Secondary | ICD-10-CM | POA: Diagnosis not present

## 2022-07-21 DIAGNOSIS — R296 Repeated falls: Secondary | ICD-10-CM | POA: Diagnosis not present

## 2022-07-21 DIAGNOSIS — J951 Acute pulmonary insufficiency following thoracic surgery: Secondary | ICD-10-CM | POA: Diagnosis not present

## 2022-07-21 DIAGNOSIS — R6889 Other general symptoms and signs: Secondary | ICD-10-CM | POA: Diagnosis not present

## 2022-07-21 DIAGNOSIS — T148XXA Other injury of unspecified body region, initial encounter: Secondary | ICD-10-CM | POA: Diagnosis not present

## 2022-07-21 DIAGNOSIS — K219 Gastro-esophageal reflux disease without esophagitis: Secondary | ICD-10-CM | POA: Diagnosis not present

## 2022-07-21 DIAGNOSIS — M47812 Spondylosis without myelopathy or radiculopathy, cervical region: Secondary | ICD-10-CM | POA: Diagnosis not present

## 2022-07-21 DIAGNOSIS — I440 Atrioventricular block, first degree: Secondary | ICD-10-CM | POA: Diagnosis not present

## 2022-07-21 DIAGNOSIS — R079 Chest pain, unspecified: Secondary | ICD-10-CM | POA: Diagnosis not present

## 2022-07-21 DIAGNOSIS — G309 Alzheimer's disease, unspecified: Secondary | ICD-10-CM | POA: Diagnosis not present

## 2022-07-21 DIAGNOSIS — Z9181 History of falling: Secondary | ICD-10-CM | POA: Diagnosis not present

## 2022-07-21 DIAGNOSIS — S199XXA Unspecified injury of neck, initial encounter: Secondary | ICD-10-CM | POA: Diagnosis not present

## 2022-07-21 DIAGNOSIS — Z880 Allergy status to penicillin: Secondary | ICD-10-CM | POA: Diagnosis not present

## 2022-07-21 DIAGNOSIS — R5381 Other malaise: Secondary | ICD-10-CM | POA: Diagnosis not present

## 2022-07-21 DIAGNOSIS — Z8679 Personal history of other diseases of the circulatory system: Secondary | ICD-10-CM | POA: Diagnosis not present

## 2022-07-21 DIAGNOSIS — S32020A Wedge compression fracture of second lumbar vertebra, initial encounter for closed fracture: Secondary | ICD-10-CM | POA: Diagnosis not present

## 2022-07-21 DIAGNOSIS — I252 Old myocardial infarction: Secondary | ICD-10-CM | POA: Diagnosis not present

## 2022-07-21 DIAGNOSIS — I5032 Chronic diastolic (congestive) heart failure: Secondary | ICD-10-CM | POA: Diagnosis not present

## 2022-07-21 DIAGNOSIS — S32020D Wedge compression fracture of second lumbar vertebra, subsequent encounter for fracture with routine healing: Secondary | ICD-10-CM | POA: Diagnosis not present

## 2022-07-21 DIAGNOSIS — T07XXXD Unspecified multiple injuries, subsequent encounter: Secondary | ICD-10-CM | POA: Diagnosis not present

## 2022-07-21 DIAGNOSIS — Z87891 Personal history of nicotine dependence: Secondary | ICD-10-CM | POA: Diagnosis not present

## 2022-07-21 DIAGNOSIS — U071 COVID-19: Secondary | ICD-10-CM | POA: Diagnosis not present

## 2022-07-21 DIAGNOSIS — R54 Age-related physical debility: Secondary | ICD-10-CM | POA: Diagnosis not present

## 2022-07-21 DIAGNOSIS — Z743 Need for continuous supervision: Secondary | ICD-10-CM | POA: Diagnosis not present

## 2022-07-21 DIAGNOSIS — I110 Hypertensive heart disease with heart failure: Secondary | ICD-10-CM | POA: Diagnosis not present

## 2022-07-21 DIAGNOSIS — I951 Orthostatic hypotension: Secondary | ICD-10-CM | POA: Diagnosis not present

## 2022-07-21 DIAGNOSIS — G319 Degenerative disease of nervous system, unspecified: Secondary | ICD-10-CM | POA: Diagnosis not present

## 2022-07-21 DIAGNOSIS — W19XXXA Unspecified fall, initial encounter: Secondary | ICD-10-CM | POA: Diagnosis not present

## 2022-07-21 DIAGNOSIS — S32029D Unspecified fracture of second lumbar vertebra, subsequent encounter for fracture with routine healing: Secondary | ICD-10-CM | POA: Diagnosis not present

## 2022-07-21 DIAGNOSIS — Z881 Allergy status to other antibiotic agents status: Secondary | ICD-10-CM | POA: Diagnosis not present

## 2022-07-21 DIAGNOSIS — S32029A Unspecified fracture of second lumbar vertebra, initial encounter for closed fracture: Secondary | ICD-10-CM | POA: Diagnosis not present

## 2022-07-21 DIAGNOSIS — H409 Unspecified glaucoma: Secondary | ICD-10-CM | POA: Diagnosis not present

## 2022-07-21 DIAGNOSIS — S2241XD Multiple fractures of ribs, right side, subsequent encounter for fracture with routine healing: Secondary | ICD-10-CM | POA: Diagnosis not present

## 2022-07-21 DIAGNOSIS — Z79899 Other long term (current) drug therapy: Secondary | ICD-10-CM | POA: Diagnosis not present

## 2022-07-21 DIAGNOSIS — I509 Heart failure, unspecified: Secondary | ICD-10-CM | POA: Diagnosis not present

## 2022-07-21 DIAGNOSIS — R945 Abnormal results of liver function studies: Secondary | ICD-10-CM | POA: Diagnosis not present

## 2022-07-21 DIAGNOSIS — R7989 Other specified abnormal findings of blood chemistry: Secondary | ICD-10-CM | POA: Diagnosis not present

## 2022-07-21 DIAGNOSIS — I4892 Unspecified atrial flutter: Secondary | ICD-10-CM | POA: Diagnosis not present

## 2022-07-21 DIAGNOSIS — N179 Acute kidney failure, unspecified: Secondary | ICD-10-CM | POA: Diagnosis not present

## 2022-07-21 DIAGNOSIS — S0990XA Unspecified injury of head, initial encounter: Secondary | ICD-10-CM | POA: Diagnosis not present

## 2022-07-21 DIAGNOSIS — R7401 Elevation of levels of liver transaminase levels: Secondary | ICD-10-CM | POA: Diagnosis not present

## 2022-07-22 DIAGNOSIS — R945 Abnormal results of liver function studies: Secondary | ICD-10-CM | POA: Diagnosis not present

## 2022-07-22 DIAGNOSIS — I4891 Unspecified atrial fibrillation: Secondary | ICD-10-CM | POA: Diagnosis not present

## 2022-07-22 DIAGNOSIS — R7989 Other specified abnormal findings of blood chemistry: Secondary | ICD-10-CM | POA: Diagnosis not present

## 2022-07-22 DIAGNOSIS — R7401 Elevation of levels of liver transaminase levels: Secondary | ICD-10-CM | POA: Diagnosis not present

## 2022-07-22 DIAGNOSIS — I252 Old myocardial infarction: Secondary | ICD-10-CM | POA: Diagnosis not present

## 2022-07-22 DIAGNOSIS — U071 COVID-19: Secondary | ICD-10-CM | POA: Diagnosis not present

## 2022-07-22 DIAGNOSIS — S2241XD Multiple fractures of ribs, right side, subsequent encounter for fracture with routine healing: Secondary | ICD-10-CM | POA: Diagnosis not present

## 2022-07-22 DIAGNOSIS — I10 Essential (primary) hypertension: Secondary | ICD-10-CM | POA: Diagnosis not present

## 2022-07-22 DIAGNOSIS — I2510 Atherosclerotic heart disease of native coronary artery without angina pectoris: Secondary | ICD-10-CM | POA: Diagnosis not present

## 2022-07-22 DIAGNOSIS — S32029D Unspecified fracture of second lumbar vertebra, subsequent encounter for fracture with routine healing: Secondary | ICD-10-CM | POA: Diagnosis not present

## 2022-07-23 ENCOUNTER — Ambulatory Visit: Payer: Medicare Other | Admitting: Neurology

## 2022-07-23 DIAGNOSIS — I4891 Unspecified atrial fibrillation: Secondary | ICD-10-CM | POA: Diagnosis not present

## 2022-07-23 DIAGNOSIS — R569 Unspecified convulsions: Secondary | ICD-10-CM | POA: Diagnosis not present

## 2022-07-23 DIAGNOSIS — I10 Essential (primary) hypertension: Secondary | ICD-10-CM | POA: Diagnosis not present

## 2022-07-23 DIAGNOSIS — Z9181 History of falling: Secondary | ICD-10-CM | POA: Diagnosis not present

## 2022-07-23 DIAGNOSIS — E782 Mixed hyperlipidemia: Secondary | ICD-10-CM | POA: Diagnosis not present

## 2022-07-23 DIAGNOSIS — I252 Old myocardial infarction: Secondary | ICD-10-CM | POA: Diagnosis not present

## 2022-07-23 DIAGNOSIS — I2510 Atherosclerotic heart disease of native coronary artery without angina pectoris: Secondary | ICD-10-CM | POA: Diagnosis not present

## 2022-07-23 DIAGNOSIS — R7401 Elevation of levels of liver transaminase levels: Secondary | ICD-10-CM | POA: Diagnosis not present

## 2022-07-23 DIAGNOSIS — Z8673 Personal history of transient ischemic attack (TIA), and cerebral infarction without residual deficits: Secondary | ICD-10-CM | POA: Diagnosis not present

## 2022-07-23 DIAGNOSIS — I951 Orthostatic hypotension: Secondary | ICD-10-CM | POA: Diagnosis not present

## 2022-07-23 DIAGNOSIS — I4811 Longstanding persistent atrial fibrillation: Secondary | ICD-10-CM | POA: Diagnosis not present

## 2022-07-23 DIAGNOSIS — R7989 Other specified abnormal findings of blood chemistry: Secondary | ICD-10-CM | POA: Diagnosis not present

## 2022-07-23 DIAGNOSIS — S2241XD Multiple fractures of ribs, right side, subsequent encounter for fracture with routine healing: Secondary | ICD-10-CM | POA: Diagnosis not present

## 2022-07-23 DIAGNOSIS — U071 COVID-19: Secondary | ICD-10-CM | POA: Diagnosis not present

## 2022-07-24 DIAGNOSIS — R296 Repeated falls: Secondary | ICD-10-CM | POA: Diagnosis not present

## 2022-07-24 DIAGNOSIS — S32000S Wedge compression fracture of unspecified lumbar vertebra, sequela: Secondary | ICD-10-CM | POA: Diagnosis not present

## 2022-07-24 DIAGNOSIS — M179 Osteoarthritis of knee, unspecified: Secondary | ICD-10-CM | POA: Diagnosis not present

## 2022-07-24 DIAGNOSIS — E785 Hyperlipidemia, unspecified: Secondary | ICD-10-CM | POA: Diagnosis not present

## 2022-07-24 DIAGNOSIS — Z7401 Bed confinement status: Secondary | ICD-10-CM | POA: Diagnosis not present

## 2022-07-24 DIAGNOSIS — K869 Disease of pancreas, unspecified: Secondary | ICD-10-CM | POA: Diagnosis not present

## 2022-07-24 DIAGNOSIS — I499 Cardiac arrhythmia, unspecified: Secondary | ICD-10-CM | POA: Diagnosis not present

## 2022-07-24 DIAGNOSIS — M4319 Spondylolisthesis, multiple sites in spine: Secondary | ICD-10-CM | POA: Diagnosis not present

## 2022-07-24 DIAGNOSIS — M25519 Pain in unspecified shoulder: Secondary | ICD-10-CM | POA: Diagnosis not present

## 2022-07-24 DIAGNOSIS — T07XXXD Unspecified multiple injuries, subsequent encounter: Secondary | ICD-10-CM | POA: Diagnosis not present

## 2022-07-24 DIAGNOSIS — S01112A Laceration without foreign body of left eyelid and periocular area, initial encounter: Secondary | ICD-10-CM | POA: Diagnosis not present

## 2022-07-24 DIAGNOSIS — R5381 Other malaise: Secondary | ICD-10-CM | POA: Diagnosis not present

## 2022-07-24 DIAGNOSIS — I2510 Atherosclerotic heart disease of native coronary artery without angina pectoris: Secondary | ICD-10-CM | POA: Diagnosis not present

## 2022-07-24 DIAGNOSIS — R569 Unspecified convulsions: Secondary | ICD-10-CM | POA: Diagnosis not present

## 2022-07-24 DIAGNOSIS — K219 Gastro-esophageal reflux disease without esophagitis: Secondary | ICD-10-CM | POA: Diagnosis not present

## 2022-07-24 DIAGNOSIS — M47812 Spondylosis without myelopathy or radiculopathy, cervical region: Secondary | ICD-10-CM | POA: Diagnosis not present

## 2022-07-24 DIAGNOSIS — R404 Transient alteration of awareness: Secondary | ICD-10-CM | POA: Diagnosis not present

## 2022-07-24 DIAGNOSIS — R42 Dizziness and giddiness: Secondary | ICD-10-CM | POA: Diagnosis not present

## 2022-07-24 DIAGNOSIS — I4891 Unspecified atrial fibrillation: Secondary | ICD-10-CM | POA: Diagnosis not present

## 2022-07-24 DIAGNOSIS — S32020D Wedge compression fracture of second lumbar vertebra, subsequent encounter for fracture with routine healing: Secondary | ICD-10-CM | POA: Diagnosis not present

## 2022-07-24 DIAGNOSIS — J3489 Other specified disorders of nose and nasal sinuses: Secondary | ICD-10-CM | POA: Diagnosis not present

## 2022-07-24 DIAGNOSIS — M4692 Unspecified inflammatory spondylopathy, cervical region: Secondary | ICD-10-CM | POA: Diagnosis not present

## 2022-07-24 DIAGNOSIS — Z743 Need for continuous supervision: Secondary | ICD-10-CM | POA: Diagnosis not present

## 2022-07-24 DIAGNOSIS — S0181XA Laceration without foreign body of other part of head, initial encounter: Secondary | ICD-10-CM | POA: Diagnosis not present

## 2022-07-24 DIAGNOSIS — S0990XA Unspecified injury of head, initial encounter: Secondary | ICD-10-CM | POA: Diagnosis not present

## 2022-07-24 DIAGNOSIS — I10 Essential (primary) hypertension: Secondary | ICD-10-CM | POA: Diagnosis not present

## 2022-07-24 DIAGNOSIS — Z043 Encounter for examination and observation following other accident: Secondary | ICD-10-CM | POA: Diagnosis not present

## 2022-07-24 DIAGNOSIS — M6281 Muscle weakness (generalized): Secondary | ICD-10-CM | POA: Diagnosis not present

## 2022-07-24 DIAGNOSIS — H409 Unspecified glaucoma: Secondary | ICD-10-CM | POA: Diagnosis not present

## 2022-07-24 DIAGNOSIS — I672 Cerebral atherosclerosis: Secondary | ICD-10-CM | POA: Diagnosis not present

## 2022-07-24 DIAGNOSIS — S199XXA Unspecified injury of neck, initial encounter: Secondary | ICD-10-CM | POA: Diagnosis not present

## 2022-07-24 DIAGNOSIS — Z880 Allergy status to penicillin: Secondary | ICD-10-CM | POA: Diagnosis not present

## 2022-07-24 DIAGNOSIS — I482 Chronic atrial fibrillation, unspecified: Secondary | ICD-10-CM | POA: Diagnosis not present

## 2022-07-24 DIAGNOSIS — R6889 Other general symptoms and signs: Secondary | ICD-10-CM | POA: Diagnosis not present

## 2022-07-24 DIAGNOSIS — Z8673 Personal history of transient ischemic attack (TIA), and cerebral infarction without residual deficits: Secondary | ICD-10-CM | POA: Diagnosis not present

## 2022-07-24 DIAGNOSIS — W06XXXA Fall from bed, initial encounter: Secondary | ICD-10-CM | POA: Diagnosis not present

## 2022-07-24 DIAGNOSIS — J951 Acute pulmonary insufficiency following thoracic surgery: Secondary | ICD-10-CM | POA: Diagnosis not present

## 2022-07-24 DIAGNOSIS — F03C0 Unspecified dementia, severe, without behavioral disturbance, psychotic disturbance, mood disturbance, and anxiety: Secondary | ICD-10-CM | POA: Diagnosis not present

## 2022-07-24 DIAGNOSIS — R519 Headache, unspecified: Secondary | ICD-10-CM | POA: Diagnosis not present

## 2022-07-24 DIAGNOSIS — Z882 Allergy status to sulfonamides status: Secondary | ICD-10-CM | POA: Diagnosis not present

## 2022-07-24 DIAGNOSIS — R279 Unspecified lack of coordination: Secondary | ICD-10-CM | POA: Diagnosis not present

## 2022-07-26 ENCOUNTER — Non-Acute Institutional Stay: Payer: Medicare Other | Admitting: Family Medicine

## 2022-07-26 VITALS — BP 102/68 | HR 81 | Temp 97.4°F | Resp 18

## 2022-07-26 DIAGNOSIS — K869 Disease of pancreas, unspecified: Secondary | ICD-10-CM

## 2022-07-26 DIAGNOSIS — I482 Chronic atrial fibrillation, unspecified: Secondary | ICD-10-CM | POA: Diagnosis not present

## 2022-07-26 DIAGNOSIS — F03C0 Unspecified dementia, severe, without behavioral disturbance, psychotic disturbance, mood disturbance, and anxiety: Secondary | ICD-10-CM

## 2022-07-26 DIAGNOSIS — R296 Repeated falls: Secondary | ICD-10-CM

## 2022-07-26 DIAGNOSIS — E785 Hyperlipidemia, unspecified: Secondary | ICD-10-CM | POA: Diagnosis not present

## 2022-07-26 DIAGNOSIS — S32000S Wedge compression fracture of unspecified lumbar vertebra, sequela: Secondary | ICD-10-CM | POA: Diagnosis not present

## 2022-07-26 DIAGNOSIS — I10 Essential (primary) hypertension: Secondary | ICD-10-CM | POA: Diagnosis not present

## 2022-07-26 DIAGNOSIS — I4891 Unspecified atrial fibrillation: Secondary | ICD-10-CM | POA: Diagnosis not present

## 2022-07-26 NOTE — Progress Notes (Signed)
[facility] Palliative Care Consult Note Telephone: [phone]  Fax: [phone]   Date of encounter: 07/26/22 1:04 PM PATIENT NAME: [name] [id number]   [phone] (home)  DOB: 1933-08-14 MRN: [id number] PRIMARY CARE PROVIDER:    Glenda Chroman, MD,  [place] [address] [phone]  REFERRING PROVIDER:   Glenda Chroman, MD [address] [place],   [address] [phone]  RESPONSIBLE PARTY:    Contact Information     Name Relation Home Work Harman, [place] [phone]     Gilford Silvius [phone]  [phone]        I met face to face with patient in [facility] [place]. Palliative Care was asked to follow this patient by consultation request of  Glenda Chroman, MD to address advance care planning and complex medical decision making. This is the initial visit.          ASSESSMENT, SYMPTOM MANAGEMENT AND PLAN / RECOMMENDATIONS:   Severe Dementia unspecified whether behavior or mood disturbance Fast 7 score 7 C Poor safety awareness leading to endangering himself.   Frequent Falls/Compression fractures of lumbar vertebrae, sequelae Multiple ED visits for falls with poor safety awareness. Requires cueing and standby for any mobility. Keep path clear of clutter and well-lit  Continue to work on HEP with PT. Cautious use of tylenol, don't recommend over 2.5 gm/day given recent transaminitis.   Chronic atrial fibrillation Rate controlled on Metoprolol. Agree with decision to stop Coumadin given recurrent falls and late stage dementia.   Pancreatic lesion Significant transaminitis with negative Hepatitis panel negative No gall stones New goals of care completed with MOST form per son-in- law Question if family curious over possible lesion or other finding.   Follow up Palliative Care Visit: Palliative care will continue  to follow for complex medical decision making, advance care planning, and clarification of goals. Return 3-4 weeks or prn.    This visit was coded based on medical decision making (MDM).  PPS: 40%  HOSPICE ELIGIBILITY/DIAGNOSIS: TBD  Chief Complaint:  Palliative Care is following after recent hospitalization x 2 due to fall with multiple right sided rib fractures, L2 vertebral compression fracture and acute exacerbation of CHF with return to SNF rehab for strengthening.  HISTORY OF PRESENT ILLNESS:  [name] is a 89 y.o. year old male with unspecified dementia (likely at least partially vascular) with normal Vitamin B12, hx of prior CVA and CT head demonstrating stable cerebral atrophy and chronic small vessel disease. Both parents also suffered with dementia.  He has poor safety awareness and attempting to get OOB without help resulting in multiple falls with nasal tip fracture, T10-12 right rib fractures and L2 vertebral compression fracture. He has also had Covid 19 infection with Covid pneumonia on 02/27/22.  He had been on Coumadin anticoagulation given hx of atrial fibrillation and was recently stopped given his 89 and number of falls.  On 07/21/22 pt re-tested positive for Covid 19 on respiratory virus panel after exposure to peer in ALF with Covid 19.  He has had 10 Admissions or ED visits since September 2023 for falls, AMS and CHF.  His other comorbid conditions include secondary cardiomyopathy, permanent atrial fibrillation, HTN, dyspnea, frequent falls, BPH, generalized debility and hx of supratherapeutic INR.  Nursing states he continues to try to get OOB and has poor balance but no  awareness of need for assistance.  History obtained from review of EMR, discussion with facility staff and/or Mr. Chrissie Noa.   Component 07/23/22 07/22/22 07/21/22 07/10/22 07/09/22 06/30/22  Sodium 138 141 137 142 143 140  Potassium 3.6 3.8 3.9 3.6 3.7 3.7  Chloride 102 103 101 105 106 108 High   CO2  31.0 28.9 33.1 High  26.9 28.3 26.8  Anion Gap '5 9 3 10 9 5  '$ BUN 27 High  22 High  21 High  35 High  38 High  26 High   Creatinine 0.98 1.16 1.35 High  0.97 1.23 1.03  BUN/Creatinine Ratio '28 19 16 '$ 36 31 25  eGFR CKD-EPI (2021) Male 75  61  51 Low   76  57 Low   71   Glucose 104 89 104 98 110 111  Calcium 9.2 9.4 9.3 8.8 8.7 8.5  Albumin 3.2 Low  3.4 Low  3.1 Low  3.2 Low  -- 2.9 Low   Total Protein 7.0 7.1 7.1 6.5 -- 6.0  Total Bilirubin 1.4 High  1.6 High  1.5 High  2.5 High  -- 0.7  AST 34 48 High  53 High  23 -- 14 Low   ALT 87 High  116 High  128 High  10 Low  -- 9 Low   Alkaline Phosphatase 203 High  223 High  214 High  98 -- 103   Magnesium 1.6 - 2.6 mg/dL 1.9  [facility]  Specimen Collected: 07/23/22 05:08   levetiracetam Level 6.0 - 46.0 ug/mL 28.4    Ref Range & Units 5 d ago   Phosphorus 2.4 - 5.1 mg/dL 3.3  Resulting Agency  [facility]  Specimen Collected: 07/22/22 03:44    07/22/22 07/21/22 07/10/22 07/09/22 06/30/22 06/14/22   WBC 8.6 7.8 8.7 8.5 7.4 7.9  RBC 4.56 4.41 4.19 4.11 3.85 Low  4.13  HGB 13.9 13.8 12.9 12.7 11.9 Low  12.9  HCT 42.2 41.3 40.3 38.7 36.5 38.6  MCV 92.5 93.7 96.2 94.2 94.8 93.5  MCH 30.5 31.3 30.8 30.9 30.9 31.2  MCHC 32.9 33.4 32.0 32.8 32.6 33.4  RDW 14.5 14.7 High  14.9 High  14.9 High  14.5 14.2  MPV 10.6 High  11.1 High  10.9 High  10.5 High  10.6 High  10.8 High   Platelet 203 211 134 Low  148 160 170    Ref Range & Units 5 d ago Comments   Hep B Surface Ag Nonreactive Nonreactive   Hep A IgM Nonreactive Nonreactive   Hep B Core IgM Nonreactive Nonreactive   Hepatitis C Ab Nonreactive Nonreactive Antibodies to HCV were not detected.  A nonreactive result does not exclude the possibility of exposure to HCV.  Resulting Agency  [facility]    Specimen Collected: 07/22/22 03:44   Vitamin B-12 211 - 911 pg/ml 847  Resulting Agency  [facility]   Specimen Collected: 07/22/22 01:30  Specimen: Nasopharyngeal Swab - Nasopharyngeal structure (body structure)  Ref Range & Units 5 d ago  SARS-CoV-2 PCR Negative Positive Abnormal   Influenza A Negative Negative  Influenza B Negative Negative  RSV Negative Negative  Resulting Agency  [facility]  Specimen: Blood  Ref Range & Units 6 d ago  PRO-BNP 0.0 - 450.0 pg/mL 3,384.0 High    Component 07/22/22 07/21/22 07/21/22 07/10/22 07/10/22 07/09/22  hsTroponin I 213 High Panic  217 High Panic  209 High  Panic  357 High Panic  351 High Panic  348 High Panic   07/21/22 CT head: Impression  No acute intracranial abnormality. Stable cerebral atrophy, chronic small vessel disease, and old right cerebellar infarct.  No evidence of acute cervical spine fracture or subluxation. Degenerative spondylosis, as described above.  07/21/22 RUQ Ultrasound: Impression  1. The common bile duct measures 6.7 mm which is at the upper limits of normal. 6 mm is the upper limits of normal. Recommend correlation with labs. If there is concern for biliary obstruction, an MRCP could better evaluate. 2. No other abnormalities.  07/21/22 CXR: Negative CXR  07/18/22 CT Chest, abd and pelvis with contrast: Impression  1. Acute fracture involving the L2 vertebral body. L2 fracture involves the superior endplate and the anterior vertebral cortex. Fracture extends through the base of a large anterior osteophyte at L2. In addition, there appears to be subtle fractures involving the L1 superior and inferior endplates and associated with the anterior bridging osteophyte at L1. 2. Acute fractures involving the right tenth, eleventh and twelfth ribs. No pneumothorax. 3. Trace bilateral pleural effusions.  Volume loss in both lungs. 4. **An incidental finding of potential clinical significance has been found. Concern for a low-density or cystic lesion along the medial aspect of  the pancreatic head and uncinate process region. This area is poorly evaluated due to motion artifact. Recommend further characterization with a pancreatic MRI or pancreatic CT.** 5. Chronic occlusion or very high-grade stenosis at the origin of the celiac trunk. 6. Chronic stone in the distal left ureter measuring approximately 4 mm. No hydronephrosis. 7. Stable left adrenal nodule. This is likely benign based on the stability. 8. Stable thyroid nodule measuring up to 3.8 cm. In the setting of significant comorbidities or limited life expectancy, no follow-up recommended (ref: J Am Coll Radiol. 2015 Feb;12(2): 143-50). 9. Stable small pulmonary nodules. These nodules have not significantly changed since 2020. 10. Aortic Atherosclerosis (ICD10-I70.0).   I reviewed EMR for available labs, medications, imaging, studies and related documents. Records reviewed and summarized above.   ROS General: NAD ENMT: denies dysphagia Cardiovascular: denies chest pain, denies DOE Pulmonary: endorses intermittent non-productive cough, denies SOB or pain on inspiration Abdomen: endorses good appetite, denies constipation, staff reports incontinence of bowel GU: denies dysuria, staff reports incontinence of urine MSK:  denies increased weakness, has had several recent falls with fractures reported Skin: denies rashes or wounds Neurological: denies pain, denies insomnia   Physical Exam: Current and past weights: 07/23/22 179 lb 6.4 oz,  186 lbs 11.7 oz on 05/03/22 Constitutional: NAD General: WNWD, non-toxic in appearance EYES: anicteric sclera ENMT: intact hearing CV: S1S2, RRR with no MRG, no LE edema Pulmonary: CTAB, no increased work of breathing, no cough, room air Abdomen: normo-active BS + 4 quadrants, soft and non tender, no ascites GU: deferred MSK: no sarcopenia, moves all extremities Neuro:  noted generalized weakness, noted cognitive impairment Psych: non-anxious affect, A and O x  1 Hem/lymph/immuno: no widespread bruising  CURRENT PROBLEM LIST:  Patient Active Problem List   Diagnosis Date Noted   Frequent falls 05/01/2022   Weakness generalized 05/01/2022   Acute renal failure ([place]) 05/01/2022   Supratherapeutic INR 05/01/2022   Abdominal swelling, right lower quadrant 05/01/2022   Orthostatic hypotension 04/23/2022   Permanent atrial fibrillation ([place]) 09/07/2020   Chronic atrial fibrillation ([place]) 03/30/2016   Encounter for therapeutic drug monitoring 07/21/2013   SOB (shortness of breath) 12/23/2012   Essential hypertension, benign  H/O amiodarone therapy    Secondary cardiomyopathy, unspecified    Encounter for long-term (current) use of anticoagulants 10/02/2010   Unspecified atrial flutter ([place]) 06/22/2009   PAST MEDICAL HISTORY:  Active Ambulatory Problems    Diagnosis Date Noted   Unspecified atrial flutter ([place]) 06/22/2009   Encounter for long-term (current) use of anticoagulants 10/02/2010   Secondary cardiomyopathy, unspecified    Essential hypertension, benign    H/O amiodarone therapy    SOB (shortness of breath) 12/23/2012   Encounter for therapeutic drug monitoring 07/21/2013   Chronic atrial fibrillation ([place]) 03/30/2016   Permanent atrial fibrillation ([place]) 09/07/2020   Orthostatic hypotension 04/23/2022   Frequent falls 05/01/2022   Weakness generalized 05/01/2022   Acute renal failure ([place]) 05/01/2022   Supratherapeutic INR 05/01/2022   Abdominal swelling, right lower quadrant 05/01/2022   Resolved Ambulatory Problems    Diagnosis Date Noted   LEFT VENTRICULAR FUNCTION, DECREASED 03/24/2009   DYSPNEA 11/30/2009   ABDOMINAL BLOATING 06/22/2009   Atrial flutter, paroxysmal (HCC)    Ejection fraction    IBS (irritable bowel syndrome)    Warfarin anticoagulation    Past Medical History:  Diagnosis Date   BPH (benign prostatic hypertrophy)    Cardiomyopathy (HCC)    Urination, excessive at night    SOCIAL HX:  Social  History   Tobacco Use   Smoking status: Never    Passive exposure: Never   Smokeless tobacco: Never   Tobacco comments:    tobacco use- no  Substance Use Topics   Alcohol use: No    Alcohol/week: 0.0 standard drinks of alcohol   FAMILY HX:  Family History  Problem Relation Age of Onset   Diabetes Mother    Dementia Mother    Dementia Father    Diabetes Paternal Grandmother        Preferred Pharmacy: ALLERGIES:  Allergies  Allergen Reactions   Penicillins Rash    Has patient had a PCN reaction causing immediate rash, facial/tongue/throat swelling, SOB or lightheadedness with hypotension: Yes Has patient had a PCN reaction causing severe rash involving mucus membranes or skin necrosis: No Has patient had a PCN reaction that required hospitalization: No Has patient had a PCN reaction occurring within the last 10 years: No If all of the above answers are "NO", then may proceed with Cephalosporin use.    Sulfonamide Derivatives Rash     PERTINENT MEDICATIONS:  Outpatient Encounter Medications as of 07/26/2022  Medication Sig   alfuzosin (UROXATRAL) 10 MG 24 hr tablet Take 10 mg by mouth at bedtime.   atorvastatin (LIPITOR) 10 MG tablet Take 10 mg by mouth daily.    citalopram (CELEXA) 20 MG tablet Take 1 tablet by mouth daily.   famotidine (PEPCID) 20 MG tablet Take 1 tablet by mouth daily.   Flaxseed, Linseed, (FLAXSEED OIL) 1000 MG CAPS Take 1 capsule by mouth daily.   latanoprost (XALATAN) 0.005 % ophthalmic solution Place 1 drop into both eyes at bedtime.   metoprolol succinate (TOPROL-XL) 25 MG 24 hr tablet TAKE 1/2 TABLET BY MOUTH ONCE DAILY; SCHEDULE AN APPOINTMENT FOR FURTHER REFILLS; FINAL ATTEMPT.   Omega-3 Fatty Acids (FISH OIL) 1000 MG CAPS Take 1,000 mg by mouth. OCCASIONALLY   warfarin (COUMADIN) 5 MG tablet TAKE ONE TABLET BY MOUTH DAILY EXCEPT TAKE 1 AND 1/2 TABLETS ON TUESDAY AND SATURDAY.   [DISCONTINUED] acetaminophen (TYLENOL) tablet    [DISCONTINUED]  Acetaminophen SOLN    [DISCONTINUED] citalopram (CELEXA) tablet    [DISCONTINUED] enoxaparin (LOVENOX) injection    [  DISCONTINUED] furosemide (LASIX) injection    [DISCONTINUED] guaiFENesin (ROBITUSSIN) 100 MG/5ML liquid    [DISCONTINUED] ipratropium-albuterol (DUONEB) 0.5-2.5 (3) MG/3ML nebulizer solution    [DISCONTINUED] latanoprost (XALATAN) 0.005 % ophthalmic solution    [DISCONTINUED] levETIRAcetam (KEPPRA) tablet    [DISCONTINUED] metoprolol succinate (TOPROL-XL) 24 hr tablet    [DISCONTINUED] morphine 4 MG/ML injection    [DISCONTINUED] naloxone (NARCAN) injection    [DISCONTINUED] senna-docusate (Senokot-S) tablet    No facility-administered encounter medications on file as of 07/26/2022.     -------------------------------------------------------- Advance Care Planning/Goals of Care: Goals include to maximize quality of life and symptom management.  Identification of a healthcare agent_HC POA, son-in-law Milbert Coulter [phone] Review of an advance directive document-MOST, scanned copy uploaded to [facility] in [place]. Decision not to resuscitate or to de-escalate disease focused treatments due to poor prognosis. CODE STATUS: MOST as of 07/23/22: DNR/DNI with comfort measures Use of antibiotics and IV fluids if indicated No feeding tube.     Thank you for the opportunity to participate in the care of Mr. Chrissie Noa.  The palliative care team will continue to follow. Please call our office at [phone] if we can be of additional assistance.   Marijo Conception, FNP-C  COVID-19 PATIENT SCREENING TOOL Asked and negative response unless otherwise noted:  Have you had symptoms of covid, tested positive or been in contact with someone with symptoms/positive test in the past 5-10 days?  Tested positive for Covid 19 on 07/21/22

## 2022-07-27 ENCOUNTER — Encounter: Payer: Self-pay | Admitting: Family Medicine

## 2022-07-27 DIAGNOSIS — K869 Disease of pancreas, unspecified: Secondary | ICD-10-CM | POA: Insufficient documentation

## 2022-07-27 DIAGNOSIS — F03C0 Unspecified dementia, severe, without behavioral disturbance, psychotic disturbance, mood disturbance, and anxiety: Secondary | ICD-10-CM | POA: Insufficient documentation

## 2022-07-27 DIAGNOSIS — S32000S Wedge compression fracture of unspecified lumbar vertebra, sequela: Secondary | ICD-10-CM | POA: Insufficient documentation

## 2022-08-03 DIAGNOSIS — I10 Essential (primary) hypertension: Secondary | ICD-10-CM | POA: Diagnosis not present

## 2022-08-03 DIAGNOSIS — R296 Repeated falls: Secondary | ICD-10-CM | POA: Diagnosis not present

## 2022-08-08 DIAGNOSIS — I672 Cerebral atherosclerosis: Secondary | ICD-10-CM | POA: Diagnosis not present

## 2022-08-08 DIAGNOSIS — Z743 Need for continuous supervision: Secondary | ICD-10-CM | POA: Diagnosis not present

## 2022-08-08 DIAGNOSIS — R42 Dizziness and giddiness: Secondary | ICD-10-CM | POA: Diagnosis not present

## 2022-08-08 DIAGNOSIS — S0181XA Laceration without foreign body of other part of head, initial encounter: Secondary | ICD-10-CM | POA: Diagnosis not present

## 2022-08-08 DIAGNOSIS — Z8673 Personal history of transient ischemic attack (TIA), and cerebral infarction without residual deficits: Secondary | ICD-10-CM | POA: Diagnosis not present

## 2022-08-08 DIAGNOSIS — R519 Headache, unspecified: Secondary | ICD-10-CM | POA: Diagnosis not present

## 2022-08-08 DIAGNOSIS — J3489 Other specified disorders of nose and nasal sinuses: Secondary | ICD-10-CM | POA: Diagnosis not present

## 2022-08-08 DIAGNOSIS — W06XXXA Fall from bed, initial encounter: Secondary | ICD-10-CM | POA: Diagnosis not present

## 2022-08-08 DIAGNOSIS — E785 Hyperlipidemia, unspecified: Secondary | ICD-10-CM | POA: Diagnosis not present

## 2022-08-08 DIAGNOSIS — S199XXA Unspecified injury of neck, initial encounter: Secondary | ICD-10-CM | POA: Diagnosis not present

## 2022-08-08 DIAGNOSIS — I499 Cardiac arrhythmia, unspecified: Secondary | ICD-10-CM | POA: Diagnosis not present

## 2022-08-08 DIAGNOSIS — S01112A Laceration without foreign body of left eyelid and periocular area, initial encounter: Secondary | ICD-10-CM | POA: Diagnosis not present

## 2022-08-08 DIAGNOSIS — S0990XA Unspecified injury of head, initial encounter: Secondary | ICD-10-CM | POA: Diagnosis not present

## 2022-08-08 DIAGNOSIS — I4891 Unspecified atrial fibrillation: Secondary | ICD-10-CM | POA: Diagnosis not present

## 2022-08-08 DIAGNOSIS — M4692 Unspecified inflammatory spondylopathy, cervical region: Secondary | ICD-10-CM | POA: Diagnosis not present

## 2022-08-08 DIAGNOSIS — Z882 Allergy status to sulfonamides status: Secondary | ICD-10-CM | POA: Diagnosis not present

## 2022-08-08 DIAGNOSIS — M47812 Spondylosis without myelopathy or radiculopathy, cervical region: Secondary | ICD-10-CM | POA: Diagnosis not present

## 2022-08-08 DIAGNOSIS — Z880 Allergy status to penicillin: Secondary | ICD-10-CM | POA: Diagnosis not present

## 2022-08-08 DIAGNOSIS — I10 Essential (primary) hypertension: Secondary | ICD-10-CM | POA: Diagnosis not present

## 2022-08-08 DIAGNOSIS — Z043 Encounter for examination and observation following other accident: Secondary | ICD-10-CM | POA: Diagnosis not present

## 2022-08-08 DIAGNOSIS — R404 Transient alteration of awareness: Secondary | ICD-10-CM | POA: Diagnosis not present

## 2022-08-08 DIAGNOSIS — M25519 Pain in unspecified shoulder: Secondary | ICD-10-CM | POA: Diagnosis not present

## 2022-08-08 DIAGNOSIS — M4319 Spondylolisthesis, multiple sites in spine: Secondary | ICD-10-CM | POA: Diagnosis not present

## 2022-08-10 DIAGNOSIS — R279 Unspecified lack of coordination: Secondary | ICD-10-CM | POA: Diagnosis not present

## 2022-08-10 DIAGNOSIS — M6281 Muscle weakness (generalized): Secondary | ICD-10-CM | POA: Diagnosis not present

## 2022-08-12 DIAGNOSIS — R279 Unspecified lack of coordination: Secondary | ICD-10-CM | POA: Diagnosis not present

## 2022-08-12 DIAGNOSIS — M6281 Muscle weakness (generalized): Secondary | ICD-10-CM | POA: Diagnosis not present

## 2022-08-13 DIAGNOSIS — M6281 Muscle weakness (generalized): Secondary | ICD-10-CM | POA: Diagnosis not present

## 2022-08-13 DIAGNOSIS — R279 Unspecified lack of coordination: Secondary | ICD-10-CM | POA: Diagnosis not present

## 2022-08-14 DIAGNOSIS — I10 Essential (primary) hypertension: Secondary | ICD-10-CM | POA: Diagnosis not present

## 2022-08-14 DIAGNOSIS — S01112D Laceration without foreign body of left eyelid and periocular area, subsequent encounter: Secondary | ICD-10-CM | POA: Diagnosis not present

## 2022-08-14 DIAGNOSIS — R569 Unspecified convulsions: Secondary | ICD-10-CM | POA: Diagnosis not present

## 2022-08-14 DIAGNOSIS — E785 Hyperlipidemia, unspecified: Secondary | ICD-10-CM | POA: Diagnosis not present

## 2022-08-19 DIAGNOSIS — R0902 Hypoxemia: Secondary | ICD-10-CM | POA: Diagnosis not present

## 2022-08-19 DIAGNOSIS — W19XXXA Unspecified fall, initial encounter: Secondary | ICD-10-CM | POA: Diagnosis not present

## 2022-08-19 DIAGNOSIS — Z87891 Personal history of nicotine dependence: Secondary | ICD-10-CM | POA: Diagnosis not present

## 2022-08-19 DIAGNOSIS — Z79899 Other long term (current) drug therapy: Secondary | ICD-10-CM | POA: Diagnosis not present

## 2022-08-19 DIAGNOSIS — G309 Alzheimer's disease, unspecified: Secondary | ICD-10-CM | POA: Diagnosis not present

## 2022-08-19 DIAGNOSIS — Z743 Need for continuous supervision: Secondary | ICD-10-CM | POA: Diagnosis not present

## 2022-08-19 DIAGNOSIS — S0990XA Unspecified injury of head, initial encounter: Secondary | ICD-10-CM | POA: Diagnosis not present

## 2022-08-19 DIAGNOSIS — S0081XA Abrasion of other part of head, initial encounter: Secondary | ICD-10-CM | POA: Diagnosis not present

## 2022-08-19 DIAGNOSIS — R531 Weakness: Secondary | ICD-10-CM | POA: Diagnosis not present

## 2022-08-19 DIAGNOSIS — I4891 Unspecified atrial fibrillation: Secondary | ICD-10-CM | POA: Diagnosis not present

## 2022-08-19 DIAGNOSIS — I10 Essential (primary) hypertension: Secondary | ICD-10-CM | POA: Diagnosis not present

## 2022-08-19 DIAGNOSIS — E785 Hyperlipidemia, unspecified: Secondary | ICD-10-CM | POA: Diagnosis not present

## 2022-08-19 DIAGNOSIS — R5381 Other malaise: Secondary | ICD-10-CM | POA: Diagnosis not present

## 2022-08-19 DIAGNOSIS — Z7401 Bed confinement status: Secondary | ICD-10-CM | POA: Diagnosis not present

## 2022-08-19 DIAGNOSIS — S0003XA Contusion of scalp, initial encounter: Secondary | ICD-10-CM | POA: Diagnosis not present

## 2022-08-27 ENCOUNTER — Ambulatory Visit: Payer: Medicare Other | Admitting: Neurology

## 2022-09-03 DIAGNOSIS — I4891 Unspecified atrial fibrillation: Secondary | ICD-10-CM | POA: Diagnosis not present

## 2022-09-03 DIAGNOSIS — I10 Essential (primary) hypertension: Secondary | ICD-10-CM | POA: Diagnosis not present

## 2022-09-03 DIAGNOSIS — R569 Unspecified convulsions: Secondary | ICD-10-CM | POA: Diagnosis not present

## 2022-09-06 DIAGNOSIS — M179 Osteoarthritis of knee, unspecified: Secondary | ICD-10-CM | POA: Diagnosis not present

## 2022-09-10 DIAGNOSIS — I4891 Unspecified atrial fibrillation: Secondary | ICD-10-CM | POA: Diagnosis not present

## 2022-09-10 DIAGNOSIS — I10 Essential (primary) hypertension: Secondary | ICD-10-CM | POA: Diagnosis not present

## 2022-09-10 DIAGNOSIS — I2510 Atherosclerotic heart disease of native coronary artery without angina pectoris: Secondary | ICD-10-CM | POA: Diagnosis not present

## 2022-09-18 DIAGNOSIS — E785 Hyperlipidemia, unspecified: Secondary | ICD-10-CM | POA: Diagnosis not present

## 2022-09-18 DIAGNOSIS — I4891 Unspecified atrial fibrillation: Secondary | ICD-10-CM | POA: Diagnosis not present

## 2022-09-18 DIAGNOSIS — I2510 Atherosclerotic heart disease of native coronary artery without angina pectoris: Secondary | ICD-10-CM | POA: Diagnosis not present

## 2022-09-19 DIAGNOSIS — R279 Unspecified lack of coordination: Secondary | ICD-10-CM | POA: Diagnosis not present

## 2022-09-19 DIAGNOSIS — M6281 Muscle weakness (generalized): Secondary | ICD-10-CM | POA: Diagnosis not present

## 2022-09-19 DIAGNOSIS — R1312 Dysphagia, oropharyngeal phase: Secondary | ICD-10-CM | POA: Diagnosis not present

## 2022-09-20 DIAGNOSIS — R1312 Dysphagia, oropharyngeal phase: Secondary | ICD-10-CM | POA: Diagnosis not present

## 2022-09-20 DIAGNOSIS — R279 Unspecified lack of coordination: Secondary | ICD-10-CM | POA: Diagnosis not present

## 2022-09-20 DIAGNOSIS — M6281 Muscle weakness (generalized): Secondary | ICD-10-CM | POA: Diagnosis not present

## 2022-09-21 DIAGNOSIS — R1312 Dysphagia, oropharyngeal phase: Secondary | ICD-10-CM | POA: Diagnosis not present

## 2022-09-21 DIAGNOSIS — R279 Unspecified lack of coordination: Secondary | ICD-10-CM | POA: Diagnosis not present

## 2022-09-21 DIAGNOSIS — M6281 Muscle weakness (generalized): Secondary | ICD-10-CM | POA: Diagnosis not present

## 2022-09-24 ENCOUNTER — Ambulatory Visit: Payer: Self-pay | Admitting: *Deleted

## 2022-09-24 DIAGNOSIS — R279 Unspecified lack of coordination: Secondary | ICD-10-CM | POA: Diagnosis not present

## 2022-09-24 DIAGNOSIS — R1312 Dysphagia, oropharyngeal phase: Secondary | ICD-10-CM | POA: Diagnosis not present

## 2022-09-24 DIAGNOSIS — M6281 Muscle weakness (generalized): Secondary | ICD-10-CM | POA: Diagnosis not present

## 2022-09-25 DIAGNOSIS — M6281 Muscle weakness (generalized): Secondary | ICD-10-CM | POA: Diagnosis not present

## 2022-09-25 DIAGNOSIS — R279 Unspecified lack of coordination: Secondary | ICD-10-CM | POA: Diagnosis not present

## 2022-09-25 DIAGNOSIS — R1312 Dysphagia, oropharyngeal phase: Secondary | ICD-10-CM | POA: Diagnosis not present

## 2022-09-26 DIAGNOSIS — R279 Unspecified lack of coordination: Secondary | ICD-10-CM | POA: Diagnosis not present

## 2022-09-26 DIAGNOSIS — R1312 Dysphagia, oropharyngeal phase: Secondary | ICD-10-CM | POA: Diagnosis not present

## 2022-09-26 DIAGNOSIS — M6281 Muscle weakness (generalized): Secondary | ICD-10-CM | POA: Diagnosis not present

## 2022-09-27 DIAGNOSIS — R279 Unspecified lack of coordination: Secondary | ICD-10-CM | POA: Diagnosis not present

## 2022-09-27 DIAGNOSIS — R1312 Dysphagia, oropharyngeal phase: Secondary | ICD-10-CM | POA: Diagnosis not present

## 2022-09-27 DIAGNOSIS — M6281 Muscle weakness (generalized): Secondary | ICD-10-CM | POA: Diagnosis not present

## 2022-09-28 DIAGNOSIS — R279 Unspecified lack of coordination: Secondary | ICD-10-CM | POA: Diagnosis not present

## 2022-09-28 DIAGNOSIS — M6281 Muscle weakness (generalized): Secondary | ICD-10-CM | POA: Diagnosis not present

## 2022-09-28 DIAGNOSIS — R1312 Dysphagia, oropharyngeal phase: Secondary | ICD-10-CM | POA: Diagnosis not present

## 2022-10-01 DIAGNOSIS — M6281 Muscle weakness (generalized): Secondary | ICD-10-CM | POA: Diagnosis not present

## 2022-10-01 DIAGNOSIS — R279 Unspecified lack of coordination: Secondary | ICD-10-CM | POA: Diagnosis not present

## 2022-10-01 DIAGNOSIS — R1312 Dysphagia, oropharyngeal phase: Secondary | ICD-10-CM | POA: Diagnosis not present

## 2022-10-02 DIAGNOSIS — R1312 Dysphagia, oropharyngeal phase: Secondary | ICD-10-CM | POA: Diagnosis not present

## 2022-10-02 DIAGNOSIS — M6281 Muscle weakness (generalized): Secondary | ICD-10-CM | POA: Diagnosis not present

## 2022-10-02 DIAGNOSIS — R279 Unspecified lack of coordination: Secondary | ICD-10-CM | POA: Diagnosis not present

## 2022-10-03 DIAGNOSIS — R1312 Dysphagia, oropharyngeal phase: Secondary | ICD-10-CM | POA: Diagnosis not present

## 2022-10-03 DIAGNOSIS — R279 Unspecified lack of coordination: Secondary | ICD-10-CM | POA: Diagnosis not present

## 2022-10-03 DIAGNOSIS — M6281 Muscle weakness (generalized): Secondary | ICD-10-CM | POA: Diagnosis not present

## 2022-10-04 DIAGNOSIS — R279 Unspecified lack of coordination: Secondary | ICD-10-CM | POA: Diagnosis not present

## 2022-10-04 DIAGNOSIS — M6281 Muscle weakness (generalized): Secondary | ICD-10-CM | POA: Diagnosis not present

## 2022-10-04 DIAGNOSIS — R1312 Dysphagia, oropharyngeal phase: Secondary | ICD-10-CM | POA: Diagnosis not present

## 2022-10-05 DIAGNOSIS — R279 Unspecified lack of coordination: Secondary | ICD-10-CM | POA: Diagnosis not present

## 2022-10-05 DIAGNOSIS — R1312 Dysphagia, oropharyngeal phase: Secondary | ICD-10-CM | POA: Diagnosis not present

## 2022-10-05 DIAGNOSIS — M6281 Muscle weakness (generalized): Secondary | ICD-10-CM | POA: Diagnosis not present

## 2022-10-07 DIAGNOSIS — M179 Osteoarthritis of knee, unspecified: Secondary | ICD-10-CM | POA: Diagnosis not present

## 2022-10-08 ENCOUNTER — Non-Acute Institutional Stay: Payer: Medicare Other | Admitting: Family Medicine

## 2022-10-08 VITALS — BP 118/76 | HR 72 | Temp 96.7°F | Resp 18

## 2022-10-08 DIAGNOSIS — R569 Unspecified convulsions: Secondary | ICD-10-CM | POA: Diagnosis not present

## 2022-10-08 DIAGNOSIS — Z7901 Long term (current) use of anticoagulants: Secondary | ICD-10-CM | POA: Diagnosis not present

## 2022-10-08 DIAGNOSIS — E785 Hyperlipidemia, unspecified: Secondary | ICD-10-CM | POA: Diagnosis not present

## 2022-10-08 DIAGNOSIS — R296 Repeated falls: Secondary | ICD-10-CM

## 2022-10-08 DIAGNOSIS — R404 Transient alteration of awareness: Secondary | ICD-10-CM | POA: Diagnosis not present

## 2022-10-08 DIAGNOSIS — S5002XA Contusion of left elbow, initial encounter: Secondary | ICD-10-CM | POA: Diagnosis not present

## 2022-10-08 DIAGNOSIS — S199XXA Unspecified injury of neck, initial encounter: Secondary | ICD-10-CM | POA: Diagnosis not present

## 2022-10-08 DIAGNOSIS — S0003XA Contusion of scalp, initial encounter: Secondary | ICD-10-CM | POA: Diagnosis not present

## 2022-10-08 DIAGNOSIS — R634 Abnormal weight loss: Secondary | ICD-10-CM | POA: Diagnosis not present

## 2022-10-08 DIAGNOSIS — F03C0 Unspecified dementia, severe, without behavioral disturbance, psychotic disturbance, mood disturbance, and anxiety: Secondary | ICD-10-CM

## 2022-10-08 DIAGNOSIS — R279 Unspecified lack of coordination: Secondary | ICD-10-CM | POA: Diagnosis not present

## 2022-10-08 DIAGNOSIS — W19XXXA Unspecified fall, initial encounter: Secondary | ICD-10-CM | POA: Diagnosis not present

## 2022-10-08 DIAGNOSIS — M25522 Pain in left elbow: Secondary | ICD-10-CM | POA: Diagnosis not present

## 2022-10-08 DIAGNOSIS — R531 Weakness: Secondary | ICD-10-CM | POA: Diagnosis not present

## 2022-10-08 DIAGNOSIS — S0990XA Unspecified injury of head, initial encounter: Secondary | ICD-10-CM | POA: Diagnosis not present

## 2022-10-08 DIAGNOSIS — I10 Essential (primary) hypertension: Secondary | ICD-10-CM | POA: Diagnosis not present

## 2022-10-08 DIAGNOSIS — M6281 Muscle weakness (generalized): Secondary | ICD-10-CM | POA: Diagnosis not present

## 2022-10-08 DIAGNOSIS — I4891 Unspecified atrial fibrillation: Secondary | ICD-10-CM | POA: Diagnosis not present

## 2022-10-08 DIAGNOSIS — S3993XA Unspecified injury of pelvis, initial encounter: Secondary | ICD-10-CM | POA: Diagnosis not present

## 2022-10-08 DIAGNOSIS — Z043 Encounter for examination and observation following other accident: Secondary | ICD-10-CM | POA: Diagnosis not present

## 2022-10-08 DIAGNOSIS — Z743 Need for continuous supervision: Secondary | ICD-10-CM | POA: Diagnosis not present

## 2022-10-08 DIAGNOSIS — J9811 Atelectasis: Secondary | ICD-10-CM | POA: Diagnosis not present

## 2022-10-08 DIAGNOSIS — R1312 Dysphagia, oropharyngeal phase: Secondary | ICD-10-CM | POA: Diagnosis not present

## 2022-10-08 DIAGNOSIS — W050XXA Fall from non-moving wheelchair, initial encounter: Secondary | ICD-10-CM | POA: Diagnosis not present

## 2022-10-08 NOTE — Progress Notes (Signed)
Therapist, nutritional Palliative Care Consult Note Telephone: [phone]  Fax: [phone]   Date of encounter: 10/08/22 3:00 pm PATIENT NAME: [name] [address] [place] [address]   [phone] (home)  DOB: May 09, 1934 MRN: [id number] PRIMARY CARE PROVIDER:    Ignatius Specking, MD,  [address] [place] [address] [phone]  REFERRING PROVIDER:   Ignatius Specking, MD [address] [place],  [place] [address] [phone]  Health Care Agent/Health Care Power of Attorney:    Contact Information     Name Relation Home Work [place] Friend [phone]     Lorrin Mais [phone]  [phone]        I met face to face with patient in [facility] facility. Palliative Care was asked to follow this patient by consultation request of Stephanie Swaziland, NP to address advance care planning and complex medical decision making. This is a follow up visit.   Review of an existing advance directive document-MOST  CODE STATUS: MOST as of 07/23/22: DNR/DNI with limited additional intervention Use of antibiotics and IV fluids if indicated No feeding tube.    ASSESSMENT AND / RECOMMENDATIONS:  PPS: 40% Seizure Given Ativan 0.5 mg by facility NP for recurrent small focal seizures. Facility nurse states pt on Keppra but not noted on MAR, needs level if taking. Recommend CBC, CMP. Agree with facility NP referral to Neurology  Severe dementia, unspecified type or whether behavioral or mood disorder Fast 7 Score 7c Continues on Celexa 20 mg daily.  Abnomal weight loss Since last year has lost 61 lbs, now 164 lbs. Recommend increasing protein intake with supplements per house, follow up by facility nutritionist. May benefit from appetite stimulant-recommend Megace or Marinol.  4.   Frequent falls/chronic long term anticoagulation Has had 7 ED visits for falls since 04/2022 with rib fractures, no  significant bleeding.   Need to address with health care POA risks/benefits of continuing anticoagulation/risks of life threatening bleed.    Follow up Palliative Care Visit:  Palliative Care continuing to follow up by monitoring for changes in appetite, weight, functional and cognitive status for chronic disease progression and management in agreement with patient's stated goals of care. Next visit in 3-4  weeks or prn.  This visit was coded based on medical decision making (MDM).  Chief Complaint  Had ER visit this am for fall with hitting his head, on Coumadin. Follow up at facility per Palliative Care.  HISTORY OF PRESENT ILLNESS: [name] is a 89 y.o. year old male with severe dementia who had a fall overnight hitting his left forehead and was seen in the ER having hit his head and being on Coumadin.  Facility nurse states he is having small, focal seizures of 20-30 sec, sometimes just staring or currently having focal seizure with left arm extended out.  Facility NP, Stephanie Swaziland gave orders and pt was given Ativan 0.5 mg and she plans to refer to Neurology.  Nursing indicates pt is on Keppra but this is not listed on the [facility].  Pt has had a 61 lb weight loss in the last year. Pt denies any knowledge of the seizure coming on.  He is incontinent of bowel and bladder at baseline but does not respond to verbal stimuli in episode this provider witnessed where the left arm is outstretched.  Denies pain, SOB, nausea, dysuria or constipation.   ACTIVITIES OF DAILY LIVING: CONTINENT  OF BLADDER/ BOWEL? No  MOBILITY:   WHEELCHAIR  APPETITE? Good Weight: 164 lbs at facility, weight at ED 05/22/22 was 183 lbs 8 oz  CURRENT PROBLEM LIST:  Patient Active Problem List   Diagnosis Date Noted   Severe dementia 07/27/2022   Compression fracture of lumbar vertebra, sequela 07/27/2022   Pancreatic lesion 07/27/2022   Frequent falls 05/01/2022   Weakness generalized 05/01/2022   Acute renal  failure 05/01/2022   Supratherapeutic INR 05/01/2022   Abdominal swelling, right lower quadrant 05/01/2022   Orthostatic hypotension 04/23/2022   Permanent atrial fibrillation 09/07/2020   Chronic atrial fibrillation 03/30/2016   SOB (shortness of breath) 12/23/2012   Essential hypertension, benign    H/O amiodarone therapy    Secondary cardiomyopathy, unspecified    Encounter for long-term (current) use of anticoagulants 10/02/2010   PAST MEDICAL HISTORY:  Active Ambulatory Problems    Diagnosis Date Noted   Encounter for long-term (current) use of anticoagulants 10/02/2010   Secondary cardiomyopathy, unspecified    Essential hypertension, benign    H/O amiodarone therapy    SOB (shortness of breath) 12/23/2012   Chronic atrial fibrillation 03/30/2016   Permanent atrial fibrillation 09/07/2020   Orthostatic hypotension 04/23/2022   Frequent falls 05/01/2022   Weakness generalized 05/01/2022   Acute renal failure 05/01/2022   Supratherapeutic INR 05/01/2022   Abdominal swelling, right lower quadrant 05/01/2022   Severe dementia 07/27/2022   Compression fracture of lumbar vertebra, sequela 07/27/2022   Pancreatic lesion 07/27/2022   Resolved Ambulatory Problems    Diagnosis Date Noted   Unspecified atrial flutter 06/22/2009   LEFT VENTRICULAR FUNCTION, DECREASED 03/24/2009   DYSPNEA 11/30/2009   ABDOMINAL BLOATING 06/22/2009   Atrial flutter, paroxysmal    Ejection fraction    IBS (irritable bowel syndrome)    Warfarin anticoagulation    Encounter for therapeutic drug monitoring 07/21/2013   Past Medical History:  Diagnosis Date   BPH (benign prostatic hypertrophy)    Cardiomyopathy (HCC)    Urination, excessive at night    SOCIAL HX:  Social History   Tobacco Use   Smoking status: Never    Passive exposure: Never   Smokeless tobacco: Never   Tobacco comments:    tobacco use- no  Substance Use Topics   Alcohol use: No    Alcohol/week: 0.0 standard drinks of  alcohol   FAMILY HX:  Family History  Problem Relation Age of Onset   Diabetes Mother    Dementia Mother    Dementia Father    Diabetes Paternal Grandmother        Preferred Pharmacy: ALLERGIES:  Allergies  Allergen Reactions   Penicillins Rash    Has patient had a PCN reaction causing immediate rash, facial/tongue/throat swelling, SOB or lightheadedness with hypotension: Yes Has patient had a PCN reaction causing severe rash involving mucus membranes or skin necrosis: No Has patient had a PCN reaction that required hospitalization: No Has patient had a PCN reaction occurring within the last 10 years: No If all of the above answers are "NO", then may proceed with Cephalosporin use.    Sulfonamide Derivatives Rash     PERTINENT MEDICATIONS:  Outpatient Encounter Medications as of 10/08/2022  Medication Sig   alfuzosin (UROXATRAL) 10 MG 24 hr tablet Take 10 mg by mouth at bedtime.   atorvastatin (LIPITOR) 10 MG tablet Take 10 mg by mouth daily.    citalopram (CELEXA) 20 MG tablet Take 1 tablet by mouth daily.   famotidine (PEPCID) 20 MG tablet  Take 1 tablet by mouth daily.   Flaxseed, Linseed, (FLAXSEED OIL) 1000 MG CAPS Take 1 capsule by mouth daily.   latanoprost (XALATAN) 0.005 % ophthalmic solution Place 1 drop into both eyes at bedtime.   metoprolol succinate (TOPROL-XL) 25 MG 24 hr tablet TAKE 1/2 TABLET BY MOUTH ONCE DAILY; SCHEDULE AN APPOINTMENT FOR FURTHER REFILLS; FINAL ATTEMPT.   Omega-3 Fatty Acids (FISH OIL) 1000 MG CAPS Take 1,000 mg by mouth. OCCASIONALLY   warfarin (COUMADIN) 5 MG tablet TAKE ONE TABLET BY MOUTH DAILY EXCEPT TAKE 1 AND 1/2 TABLETS ON TUESDAY AND SATURDAY.   No facility-administered encounter medications on file as of 10/08/2022.    History obtained from review of EMR, discussion with primary team, and interview with facility staff/caregiver and/or patient.   08/19/22: CBC elated to CBC w/ Differential Component 08/19/22 08/08/22 07/22/22  07/21/22 07/10/22 07/09/22  WBC 8.1 8.6 8.6 7.8 8.7 8.5  RBC 4.36 4.49 4.56 4.41 4.19 4.11  HGB 13.6 14.1 13.9 13.8 12.9 12.7  HCT 41.0 42.3 42.2 41.3 40.3 38.7  MCV 94.0 94.2 92.5 93.7 96.2 94.2  MCH 31.2 31.4 30.5 31.3 30.8 30.9  MCHC 33.2 33.3 32.9 33.4 32.0 32.8  RDW 14.2 14.3 14.5 14.7 High  14.9 High  14.9 High   MPV 10.5 High  11.4 High  10.6 High  11.1 High  10.9 High  10.5 High   Platelet 152 151 203 211 134 Low  148  Neutrophils % 69.4 79.8 -- 68.1 -- 75.1  Lymphocytes % 18.2 11.3 -- 16.3 -- 12.3  Monocytes % 9.2 6.5 -- 9.3 -- 10.8  Eosinophils % 2.5 1.5 -- 4.2 -- 0.9  Basophils % 0.2 0.4 -- 0.8 -- 0.4  Absolute Neutrophils 5.6 6.8 -- 5.3 -- 6.4  Absolute Lymphocytes 1.5 1.0 -- 1.3 -- 1.1  Absolute Monocytes 0.7 0.6 -- 0.7 -- 0.9  Absolute Eosinophils 0.2 0.1 -- 0.3 -- 0.1  Absolute Basophils 0.0 0.0 -- 0.1 -- 0.0   Component 08/19/22 08/08/22 07/23/22 07/22/22 07/21/22 07/10/22  Sodium 140 141 138 141 137 142  Potassium 4.4 3.9 3.6 3.8 3.9 3.6  Chloride 104 107 102 103 101 105  CO2 29.8 28.3 31.0 28.9 33.1 High  26.9  Anion Gap 6 6 5 9 3 10   BUN 26 High  24 High  27 High  22 High  21 High  35 High   Creatinine 1.23 1.16 0.98 1.16 1.35 High  0.97  BUN/Creatinine Ratio 21 21 28 19 16  36  eGFR CKD-EPI (2021) Male 57 Low   61  75  61  51 Low   76   Glucose 110 118 104 89 104 98  Calcium 9.3 9.1 9.2 9.4 9.3 8.8  Albumin 3.4 Low  -- 3.2 Low  3.4 Low  3.1 Low  3.2 Low   Total Protein 6.7 -- 7.0 7.1 7.1 6.5  Total Bilirubin 1.1 -- 1.4 High  1.6 High  1.5 High  2.5 High   AST 16 -- 34 48 High  53 High  23  ALT 13 -- 87 High  116 High  128 High  10 Low   Alkaline Phosphatase 102 -- 203 High  223 High  214 High  98           Component 08/19/22 07/21/22 07/09/22 06/30/22 05/29/22 05/21/22  Color, UA Light Yellow Yellow Yellow Yellow Yellow Light Yellow  Clarity, UA Clear Clear Clear Clear Clear Clear  Specific Gravity, UA 1.020 >=1.030 High  1.020 >=  1.030 High  >=1.030  High  <=1.005 Low   pH, UA 5.5 5.5 5.0 6.0 5.5 6.0  Leukocyte Esterase, UA Negative Negative Negative Negative Negative Negative  Nitrite, UA Negative Negative Negative Negative Negative Negative  Protein, UA Negative Trace Abnormal  Negative Negative Trace Abnormal  Negative  Glucose, UA Negative Negative Negative Negative Negative Negative  Ketones, UA Negative Negative Negative Negative Negative Negative  Urobilinogen, UA 0.2 mg/dL 1.0 mg/dL 1.0 mg/dL 1.0 mg/dL 0.2 mg/dL 1.0 mg/dL  Bilirubin, UA Negative Small Abnormal  Negative Negative Negative Negative  Blood, UA Negative Negative Negative Negative Negative Negative  RBC, UA 0 1 0 1 0 0  WBC, UA 0 2 0 3 1 1   Squam Epithel, UA 1 1 2 1 2 3   Bacteria, UA None Seen Large Abnormal  None Seen Small Few Few  Ca Oxalate Crystal, UA -- -- -- Few     10/08/22: CT Cervical Spine: Impression  1. No acute fracture or traumatic subluxation of the cervical spine. 2. Multilevel degenerative disc disease and facet hypertrophy.  10/08/22 CT head: Impression  1. Left frontal scalp hematoma. No acute intracranial abnormality. No skull fracture. 2. Stable atrophy and chronic small vessel ischemia. Remote right cerebellar infarct.  10/08/22 CXR Impression  1. No acute abnormality. 2. Mild chronic atelectasis at the left lung base.  10/08/22 Pelvic xray Impression  No fracture of the pelvis.  I reviewed available labs, medications, imaging, studies and related documents from the EMR.  Records reviewed and summarized above.   Physical Exam: GENERAL: NAD HEENT: left frontal forehead and outer canthus of left eye with slight purple ecchymosis. No deformity noted LUNGS: CTAB, no increased work of breathing, room air CARDIAC:  S1S2, RRR with no MRG, No edema/cyanosis ABD:  Normo-active BS x 4 quads, soft, non-tender EXTREMITIES: Normal ROM, no deformity, strength equal NEURO:  Generalized weakness with significant cognitive impairment.   PERRLa, no facial deformity PSYCH:  non-anxious affect, A & O x 1  Thank you for the opportunity to participate in the care of Bronwen Betters. Please call our main office at [phone] if we can be of additional assistance.    Joycelyn Man FNP-C  [facility]  Phone:  [phone]

## 2022-10-09 DIAGNOSIS — R569 Unspecified convulsions: Secondary | ICD-10-CM | POA: Diagnosis not present

## 2022-10-09 DIAGNOSIS — R1312 Dysphagia, oropharyngeal phase: Secondary | ICD-10-CM | POA: Diagnosis not present

## 2022-10-09 DIAGNOSIS — Z7401 Bed confinement status: Secondary | ICD-10-CM | POA: Diagnosis not present

## 2022-10-09 DIAGNOSIS — R404 Transient alteration of awareness: Secondary | ICD-10-CM | POA: Diagnosis not present

## 2022-10-09 DIAGNOSIS — R279 Unspecified lack of coordination: Secondary | ICD-10-CM | POA: Diagnosis not present

## 2022-10-09 DIAGNOSIS — M6281 Muscle weakness (generalized): Secondary | ICD-10-CM | POA: Diagnosis not present

## 2022-10-10 DIAGNOSIS — R1312 Dysphagia, oropharyngeal phase: Secondary | ICD-10-CM | POA: Diagnosis not present

## 2022-10-10 DIAGNOSIS — R279 Unspecified lack of coordination: Secondary | ICD-10-CM | POA: Diagnosis not present

## 2022-10-10 DIAGNOSIS — M6281 Muscle weakness (generalized): Secondary | ICD-10-CM | POA: Diagnosis not present

## 2022-10-11 DIAGNOSIS — M6281 Muscle weakness (generalized): Secondary | ICD-10-CM | POA: Diagnosis not present

## 2022-10-11 DIAGNOSIS — R279 Unspecified lack of coordination: Secondary | ICD-10-CM | POA: Diagnosis not present

## 2022-10-11 DIAGNOSIS — R1312 Dysphagia, oropharyngeal phase: Secondary | ICD-10-CM | POA: Diagnosis not present

## 2022-10-12 DIAGNOSIS — M6281 Muscle weakness (generalized): Secondary | ICD-10-CM | POA: Diagnosis not present

## 2022-10-12 DIAGNOSIS — R531 Weakness: Secondary | ICD-10-CM | POA: Diagnosis not present

## 2022-10-12 DIAGNOSIS — S0181XA Laceration without foreign body of other part of head, initial encounter: Secondary | ICD-10-CM | POA: Diagnosis not present

## 2022-10-12 DIAGNOSIS — R6889 Other general symptoms and signs: Secondary | ICD-10-CM | POA: Diagnosis not present

## 2022-10-12 DIAGNOSIS — R279 Unspecified lack of coordination: Secondary | ICD-10-CM | POA: Diagnosis not present

## 2022-10-12 DIAGNOSIS — G309 Alzheimer's disease, unspecified: Secondary | ICD-10-CM | POA: Diagnosis not present

## 2022-10-12 DIAGNOSIS — R1312 Dysphagia, oropharyngeal phase: Secondary | ICD-10-CM | POA: Diagnosis not present

## 2022-10-12 DIAGNOSIS — Z87891 Personal history of nicotine dependence: Secondary | ICD-10-CM | POA: Diagnosis not present

## 2022-10-12 DIAGNOSIS — Z743 Need for continuous supervision: Secondary | ICD-10-CM | POA: Diagnosis not present

## 2022-10-12 DIAGNOSIS — Z882 Allergy status to sulfonamides status: Secondary | ICD-10-CM | POA: Diagnosis not present

## 2022-10-12 DIAGNOSIS — E785 Hyperlipidemia, unspecified: Secondary | ICD-10-CM | POA: Diagnosis not present

## 2022-10-12 DIAGNOSIS — S0003XA Contusion of scalp, initial encounter: Secondary | ICD-10-CM | POA: Diagnosis not present

## 2022-10-12 DIAGNOSIS — W19XXXA Unspecified fall, initial encounter: Secondary | ICD-10-CM | POA: Diagnosis not present

## 2022-10-12 DIAGNOSIS — I4891 Unspecified atrial fibrillation: Secondary | ICD-10-CM | POA: Diagnosis not present

## 2022-10-12 DIAGNOSIS — I10 Essential (primary) hypertension: Secondary | ICD-10-CM | POA: Diagnosis not present

## 2022-10-12 DIAGNOSIS — I739 Peripheral vascular disease, unspecified: Secondary | ICD-10-CM | POA: Diagnosis not present

## 2022-10-12 DIAGNOSIS — Z881 Allergy status to other antibiotic agents status: Secondary | ICD-10-CM | POA: Diagnosis not present

## 2022-10-12 DIAGNOSIS — Z880 Allergy status to penicillin: Secondary | ICD-10-CM | POA: Diagnosis not present

## 2022-10-12 DIAGNOSIS — R58 Hemorrhage, not elsewhere classified: Secondary | ICD-10-CM | POA: Diagnosis not present

## 2022-10-12 DIAGNOSIS — W050XXA Fall from non-moving wheelchair, initial encounter: Secondary | ICD-10-CM | POA: Diagnosis not present

## 2022-10-12 DIAGNOSIS — Z66 Do not resuscitate: Secondary | ICD-10-CM | POA: Diagnosis not present

## 2022-10-12 DIAGNOSIS — R52 Pain, unspecified: Secondary | ICD-10-CM | POA: Diagnosis not present

## 2022-10-15 ENCOUNTER — Ambulatory Visit: Payer: Medicare Other | Admitting: Neurology

## 2022-10-15 DIAGNOSIS — M6281 Muscle weakness (generalized): Secondary | ICD-10-CM | POA: Diagnosis not present

## 2022-10-15 DIAGNOSIS — R279 Unspecified lack of coordination: Secondary | ICD-10-CM | POA: Diagnosis not present

## 2022-10-15 DIAGNOSIS — R1312 Dysphagia, oropharyngeal phase: Secondary | ICD-10-CM | POA: Diagnosis not present

## 2022-10-16 DIAGNOSIS — M6281 Muscle weakness (generalized): Secondary | ICD-10-CM | POA: Diagnosis not present

## 2022-10-16 DIAGNOSIS — R1312 Dysphagia, oropharyngeal phase: Secondary | ICD-10-CM | POA: Diagnosis not present

## 2022-10-16 DIAGNOSIS — R279 Unspecified lack of coordination: Secondary | ICD-10-CM | POA: Diagnosis not present

## 2022-10-17 DIAGNOSIS — R279 Unspecified lack of coordination: Secondary | ICD-10-CM | POA: Diagnosis not present

## 2022-10-17 DIAGNOSIS — M6281 Muscle weakness (generalized): Secondary | ICD-10-CM | POA: Diagnosis not present

## 2022-10-17 DIAGNOSIS — R1312 Dysphagia, oropharyngeal phase: Secondary | ICD-10-CM | POA: Diagnosis not present

## 2022-10-18 DIAGNOSIS — M6281 Muscle weakness (generalized): Secondary | ICD-10-CM | POA: Diagnosis not present

## 2022-10-18 DIAGNOSIS — R1312 Dysphagia, oropharyngeal phase: Secondary | ICD-10-CM | POA: Diagnosis not present

## 2022-10-18 DIAGNOSIS — R279 Unspecified lack of coordination: Secondary | ICD-10-CM | POA: Diagnosis not present

## 2022-10-19 DIAGNOSIS — R279 Unspecified lack of coordination: Secondary | ICD-10-CM | POA: Diagnosis not present

## 2022-10-19 DIAGNOSIS — M6281 Muscle weakness (generalized): Secondary | ICD-10-CM | POA: Diagnosis not present

## 2022-10-19 DIAGNOSIS — R1312 Dysphagia, oropharyngeal phase: Secondary | ICD-10-CM | POA: Diagnosis not present

## 2022-10-22 DIAGNOSIS — M6281 Muscle weakness (generalized): Secondary | ICD-10-CM | POA: Diagnosis not present

## 2022-10-22 DIAGNOSIS — R279 Unspecified lack of coordination: Secondary | ICD-10-CM | POA: Diagnosis not present

## 2022-10-22 DIAGNOSIS — R1312 Dysphagia, oropharyngeal phase: Secondary | ICD-10-CM | POA: Diagnosis not present

## 2022-10-23 DIAGNOSIS — R279 Unspecified lack of coordination: Secondary | ICD-10-CM | POA: Diagnosis not present

## 2022-10-23 DIAGNOSIS — R1312 Dysphagia, oropharyngeal phase: Secondary | ICD-10-CM | POA: Diagnosis not present

## 2022-10-23 DIAGNOSIS — M6281 Muscle weakness (generalized): Secondary | ICD-10-CM | POA: Diagnosis not present

## 2022-10-24 DIAGNOSIS — S5002XA Contusion of left elbow, initial encounter: Secondary | ICD-10-CM | POA: Diagnosis not present

## 2022-10-24 DIAGNOSIS — S0990XA Unspecified injury of head, initial encounter: Secondary | ICD-10-CM | POA: Diagnosis not present

## 2022-10-24 DIAGNOSIS — R279 Unspecified lack of coordination: Secondary | ICD-10-CM | POA: Diagnosis not present

## 2022-10-24 DIAGNOSIS — M6281 Muscle weakness (generalized): Secondary | ICD-10-CM | POA: Diagnosis not present

## 2022-10-24 DIAGNOSIS — R278 Other lack of coordination: Secondary | ICD-10-CM | POA: Diagnosis not present

## 2022-10-25 DIAGNOSIS — R279 Unspecified lack of coordination: Secondary | ICD-10-CM | POA: Diagnosis not present

## 2022-10-25 DIAGNOSIS — R278 Other lack of coordination: Secondary | ICD-10-CM | POA: Diagnosis not present

## 2022-10-25 DIAGNOSIS — M6281 Muscle weakness (generalized): Secondary | ICD-10-CM | POA: Diagnosis not present

## 2022-10-26 DIAGNOSIS — R278 Other lack of coordination: Secondary | ICD-10-CM | POA: Diagnosis not present

## 2022-10-26 DIAGNOSIS — M6281 Muscle weakness (generalized): Secondary | ICD-10-CM | POA: Diagnosis not present

## 2022-10-26 DIAGNOSIS — R279 Unspecified lack of coordination: Secondary | ICD-10-CM | POA: Diagnosis not present

## 2022-10-27 ENCOUNTER — Encounter: Payer: Self-pay | Admitting: Family Medicine

## 2022-10-27 DIAGNOSIS — R634 Abnormal weight loss: Secondary | ICD-10-CM | POA: Insufficient documentation

## 2022-10-27 DIAGNOSIS — R569 Unspecified convulsions: Secondary | ICD-10-CM | POA: Insufficient documentation

## 2022-10-29 ENCOUNTER — Telehealth: Payer: Self-pay | Admitting: Family Medicine

## 2022-10-29 DIAGNOSIS — M6281 Muscle weakness (generalized): Secondary | ICD-10-CM | POA: Diagnosis not present

## 2022-10-29 DIAGNOSIS — R279 Unspecified lack of coordination: Secondary | ICD-10-CM | POA: Diagnosis not present

## 2022-10-29 DIAGNOSIS — R278 Other lack of coordination: Secondary | ICD-10-CM | POA: Diagnosis not present

## 2022-10-29 NOTE — Telephone Encounter (Signed)
TCT HC POA [employer] mobile phone. Left vm requesting call back. Need to discuss possible referral to Hospice level of care given prior expressed goals of care and recent frequent falls/seizures.  Joycelyn Man FNP-C

## 2022-10-30 ENCOUNTER — Telehealth: Payer: Self-pay | Admitting: Family Medicine

## 2022-10-30 DIAGNOSIS — R279 Unspecified lack of coordination: Secondary | ICD-10-CM | POA: Diagnosis not present

## 2022-10-30 DIAGNOSIS — M6281 Muscle weakness (generalized): Secondary | ICD-10-CM | POA: Diagnosis not present

## 2022-10-30 DIAGNOSIS — R278 Other lack of coordination: Secondary | ICD-10-CM | POA: Diagnosis not present

## 2022-10-30 NOTE — Telephone Encounter (Signed)
Multiple calls back and forth between [name] and this provider and had been unable to make contact.  Returned call to [name] to discuss Hospice services and answer questions. Advised that given pt's overall decline and frequent ED visits for falls that he may be appropriate for Hospice services to provide extra layer of support if it was consistent with patient's goals.  Advised that there is a home health aide to help with bathing and dressing, a nurse who will visit 1-2 times per week to manage any symptom needs, a chaplain and social work for emotional/financial and spiritual support if desired.  Advised that for the admission diagnosis that Hospice will cover the medications and DME needed for that patient related to their diagnosis.  He states that pt is receiving some PT and awaiting Medicaid approval.  He states that his understanding is that Hospice would stop all his medications and start him on pain meds.  Explained that they would review the medications with him and those that were more for a long term benefit over like a 10 year period they may recommend stopping those medications but this was not a hard and fast rule.  Advised that if pt was not having pain then pain meds would not be started, that Hospice was for any symptom management and if the patient was not wanting to go to the hospital that a nurse would be sent to evaluate what was going on to help manage any symptoms.  Encouraged caregiver./HC POA to speak with the business office and facility social worker if he might be interested in Hospice to see if the patient was on rehab days. He is getting some in-house PT due to multiple recent falls short term but pt is likely long term care.  Asked if he had any questions and he replied "No".  He was made aware that he could contact me at the number provided since he wants to think about Hospice services and talk to the patient.  He plans to visit the patient this weekend.  Joycelyn Man  FNP-C

## 2022-10-31 DIAGNOSIS — R279 Unspecified lack of coordination: Secondary | ICD-10-CM | POA: Diagnosis not present

## 2022-10-31 DIAGNOSIS — R278 Other lack of coordination: Secondary | ICD-10-CM | POA: Diagnosis not present

## 2022-10-31 DIAGNOSIS — M6281 Muscle weakness (generalized): Secondary | ICD-10-CM | POA: Diagnosis not present

## 2022-11-01 DIAGNOSIS — M6281 Muscle weakness (generalized): Secondary | ICD-10-CM | POA: Diagnosis not present

## 2022-11-01 DIAGNOSIS — R278 Other lack of coordination: Secondary | ICD-10-CM | POA: Diagnosis not present

## 2022-11-01 DIAGNOSIS — R279 Unspecified lack of coordination: Secondary | ICD-10-CM | POA: Diagnosis not present

## 2022-11-02 DIAGNOSIS — R278 Other lack of coordination: Secondary | ICD-10-CM | POA: Diagnosis not present

## 2022-11-02 DIAGNOSIS — M6281 Muscle weakness (generalized): Secondary | ICD-10-CM | POA: Diagnosis not present

## 2022-11-02 DIAGNOSIS — R279 Unspecified lack of coordination: Secondary | ICD-10-CM | POA: Diagnosis not present

## 2022-11-05 DIAGNOSIS — M6281 Muscle weakness (generalized): Secondary | ICD-10-CM | POA: Diagnosis not present

## 2022-11-05 DIAGNOSIS — R278 Other lack of coordination: Secondary | ICD-10-CM | POA: Diagnosis not present

## 2022-11-05 DIAGNOSIS — R279 Unspecified lack of coordination: Secondary | ICD-10-CM | POA: Diagnosis not present

## 2022-11-06 DIAGNOSIS — M6281 Muscle weakness (generalized): Secondary | ICD-10-CM | POA: Diagnosis not present

## 2022-11-06 DIAGNOSIS — R278 Other lack of coordination: Secondary | ICD-10-CM | POA: Diagnosis not present

## 2022-11-06 DIAGNOSIS — M179 Osteoarthritis of knee, unspecified: Secondary | ICD-10-CM | POA: Diagnosis not present

## 2022-11-06 DIAGNOSIS — R279 Unspecified lack of coordination: Secondary | ICD-10-CM | POA: Diagnosis not present

## 2022-11-07 DIAGNOSIS — R279 Unspecified lack of coordination: Secondary | ICD-10-CM | POA: Diagnosis not present

## 2022-11-07 DIAGNOSIS — M6281 Muscle weakness (generalized): Secondary | ICD-10-CM | POA: Diagnosis not present

## 2022-11-07 DIAGNOSIS — R278 Other lack of coordination: Secondary | ICD-10-CM | POA: Diagnosis not present

## 2022-11-08 DIAGNOSIS — R278 Other lack of coordination: Secondary | ICD-10-CM | POA: Diagnosis not present

## 2022-11-08 DIAGNOSIS — M6281 Muscle weakness (generalized): Secondary | ICD-10-CM | POA: Diagnosis not present

## 2022-11-08 DIAGNOSIS — R279 Unspecified lack of coordination: Secondary | ICD-10-CM | POA: Diagnosis not present

## 2022-11-09 DIAGNOSIS — Z5181 Encounter for therapeutic drug level monitoring: Secondary | ICD-10-CM | POA: Diagnosis not present

## 2022-11-09 DIAGNOSIS — R279 Unspecified lack of coordination: Secondary | ICD-10-CM | POA: Diagnosis not present

## 2022-11-09 DIAGNOSIS — M6281 Muscle weakness (generalized): Secondary | ICD-10-CM | POA: Diagnosis not present

## 2022-11-09 DIAGNOSIS — R278 Other lack of coordination: Secondary | ICD-10-CM | POA: Diagnosis not present

## 2022-11-09 DIAGNOSIS — E559 Vitamin D deficiency, unspecified: Secondary | ICD-10-CM | POA: Diagnosis not present

## 2022-11-10 DIAGNOSIS — R278 Other lack of coordination: Secondary | ICD-10-CM | POA: Diagnosis not present

## 2022-11-10 DIAGNOSIS — R279 Unspecified lack of coordination: Secondary | ICD-10-CM | POA: Diagnosis not present

## 2022-11-10 DIAGNOSIS — M6281 Muscle weakness (generalized): Secondary | ICD-10-CM | POA: Diagnosis not present

## 2022-11-12 DIAGNOSIS — M6281 Muscle weakness (generalized): Secondary | ICD-10-CM | POA: Diagnosis not present

## 2022-11-12 DIAGNOSIS — R278 Other lack of coordination: Secondary | ICD-10-CM | POA: Diagnosis not present

## 2022-11-12 DIAGNOSIS — R279 Unspecified lack of coordination: Secondary | ICD-10-CM | POA: Diagnosis not present

## 2022-11-13 DIAGNOSIS — M6281 Muscle weakness (generalized): Secondary | ICD-10-CM | POA: Diagnosis not present

## 2022-11-13 DIAGNOSIS — R279 Unspecified lack of coordination: Secondary | ICD-10-CM | POA: Diagnosis not present

## 2022-11-13 DIAGNOSIS — R278 Other lack of coordination: Secondary | ICD-10-CM | POA: Diagnosis not present

## 2022-11-14 DIAGNOSIS — M6281 Muscle weakness (generalized): Secondary | ICD-10-CM | POA: Diagnosis not present

## 2022-11-14 DIAGNOSIS — R279 Unspecified lack of coordination: Secondary | ICD-10-CM | POA: Diagnosis not present

## 2022-11-14 DIAGNOSIS — R278 Other lack of coordination: Secondary | ICD-10-CM | POA: Diagnosis not present

## 2022-11-15 DIAGNOSIS — R296 Repeated falls: Secondary | ICD-10-CM | POA: Diagnosis not present

## 2022-11-15 DIAGNOSIS — R278 Other lack of coordination: Secondary | ICD-10-CM | POA: Diagnosis not present

## 2022-11-15 DIAGNOSIS — M6281 Muscle weakness (generalized): Secondary | ICD-10-CM | POA: Diagnosis not present

## 2022-11-15 DIAGNOSIS — R279 Unspecified lack of coordination: Secondary | ICD-10-CM | POA: Diagnosis not present

## 2022-11-16 DIAGNOSIS — R278 Other lack of coordination: Secondary | ICD-10-CM | POA: Diagnosis not present

## 2022-11-16 DIAGNOSIS — M6281 Muscle weakness (generalized): Secondary | ICD-10-CM | POA: Diagnosis not present

## 2022-11-16 DIAGNOSIS — R279 Unspecified lack of coordination: Secondary | ICD-10-CM | POA: Diagnosis not present

## 2022-11-19 DIAGNOSIS — M6281 Muscle weakness (generalized): Secondary | ICD-10-CM | POA: Diagnosis not present

## 2022-11-19 DIAGNOSIS — R279 Unspecified lack of coordination: Secondary | ICD-10-CM | POA: Diagnosis not present

## 2022-11-19 DIAGNOSIS — R278 Other lack of coordination: Secondary | ICD-10-CM | POA: Diagnosis not present

## 2022-11-20 DIAGNOSIS — I10 Essential (primary) hypertension: Secondary | ICD-10-CM | POA: Diagnosis not present

## 2022-11-20 DIAGNOSIS — R5381 Other malaise: Secondary | ICD-10-CM | POA: Diagnosis not present

## 2022-11-20 DIAGNOSIS — R278 Other lack of coordination: Secondary | ICD-10-CM | POA: Diagnosis not present

## 2022-11-20 DIAGNOSIS — M6281 Muscle weakness (generalized): Secondary | ICD-10-CM | POA: Diagnosis not present

## 2022-11-20 DIAGNOSIS — I4891 Unspecified atrial fibrillation: Secondary | ICD-10-CM | POA: Diagnosis not present

## 2022-11-20 DIAGNOSIS — I739 Peripheral vascular disease, unspecified: Secondary | ICD-10-CM | POA: Diagnosis not present

## 2022-11-20 DIAGNOSIS — R279 Unspecified lack of coordination: Secondary | ICD-10-CM | POA: Diagnosis not present

## 2022-11-20 DIAGNOSIS — R296 Repeated falls: Secondary | ICD-10-CM | POA: Diagnosis not present

## 2022-11-21 DIAGNOSIS — R279 Unspecified lack of coordination: Secondary | ICD-10-CM | POA: Diagnosis not present

## 2022-11-21 DIAGNOSIS — M6281 Muscle weakness (generalized): Secondary | ICD-10-CM | POA: Diagnosis not present

## 2022-11-21 DIAGNOSIS — R278 Other lack of coordination: Secondary | ICD-10-CM | POA: Diagnosis not present

## 2022-11-22 DIAGNOSIS — R278 Other lack of coordination: Secondary | ICD-10-CM | POA: Diagnosis not present

## 2022-11-22 DIAGNOSIS — M6281 Muscle weakness (generalized): Secondary | ICD-10-CM | POA: Diagnosis not present

## 2022-11-22 DIAGNOSIS — R279 Unspecified lack of coordination: Secondary | ICD-10-CM | POA: Diagnosis not present

## 2022-11-26 DIAGNOSIS — M6281 Muscle weakness (generalized): Secondary | ICD-10-CM | POA: Diagnosis not present

## 2022-11-26 DIAGNOSIS — R278 Other lack of coordination: Secondary | ICD-10-CM | POA: Diagnosis not present

## 2022-11-27 DIAGNOSIS — R278 Other lack of coordination: Secondary | ICD-10-CM | POA: Diagnosis not present

## 2022-11-27 DIAGNOSIS — M6281 Muscle weakness (generalized): Secondary | ICD-10-CM | POA: Diagnosis not present

## 2022-11-28 ENCOUNTER — Non-Acute Institutional Stay: Payer: Medicare Other | Admitting: Family Medicine

## 2022-11-28 ENCOUNTER — Encounter: Payer: Self-pay | Admitting: Family Medicine

## 2022-11-28 VITALS — BP 106/50 | HR 56 | Temp 96.6°F | Resp 18

## 2022-11-28 DIAGNOSIS — M6281 Muscle weakness (generalized): Secondary | ICD-10-CM | POA: Diagnosis not present

## 2022-11-28 DIAGNOSIS — I10 Essential (primary) hypertension: Secondary | ICD-10-CM | POA: Diagnosis not present

## 2022-11-28 DIAGNOSIS — I482 Chronic atrial fibrillation, unspecified: Secondary | ICD-10-CM | POA: Diagnosis not present

## 2022-11-28 DIAGNOSIS — Z7901 Long term (current) use of anticoagulants: Secondary | ICD-10-CM | POA: Diagnosis not present

## 2022-11-28 DIAGNOSIS — R296 Repeated falls: Secondary | ICD-10-CM | POA: Diagnosis not present

## 2022-11-28 DIAGNOSIS — I4891 Unspecified atrial fibrillation: Secondary | ICD-10-CM | POA: Diagnosis not present

## 2022-11-28 DIAGNOSIS — R609 Edema, unspecified: Secondary | ICD-10-CM | POA: Diagnosis not present

## 2022-11-28 DIAGNOSIS — R5381 Other malaise: Secondary | ICD-10-CM | POA: Diagnosis not present

## 2022-11-28 DIAGNOSIS — R279 Unspecified lack of coordination: Secondary | ICD-10-CM | POA: Diagnosis not present

## 2022-11-28 DIAGNOSIS — R569 Unspecified convulsions: Secondary | ICD-10-CM

## 2022-11-28 DIAGNOSIS — R278 Other lack of coordination: Secondary | ICD-10-CM | POA: Diagnosis not present

## 2022-11-28 NOTE — Progress Notes (Unsigned)
Therapist, nutritional Palliative Care Consult Note Telephone: [phone]  Fax: [phone]   Date of encounter: 11/28/22 2:00 pm PATIENT NAME: [name] [address] [place] [address]   [phone] (home)  DOB: [birth year] MRN: [id number] PRIMARY CARE PROVIDER:    Ignatius Specking, MD,  [address] [place] [address] [phone]  REFERRING PROVIDER:   Ignatius Specking, MD [address] [place],  [place] [address] [phone]  Health Care Agent/Health Care Power of Attorney:    Contact Information     Name Relation Home Work [place] Friend [phone]     Lorrin Mais [phone]  [phone]        I met face to face with patient in [facility] facility. Palliative Care was asked to follow this patient by consultation request of Alfonse Ras, NP to address advance care planning and complex medical decision making. This is a follow up visit.   Review of an existing advance directive document-MOST  CODE STATUS: MOST as of 07/23/22: DNR/DNI with limited additional intervention Use of antibiotics and IV fluids if indicated No feeding tube.    ASSESSMENT AND / RECOMMENDATIONS:  PPS: 40% Seizures Keppra level therapeutic when recently checked.  Continue Keppra 500 mg BID  Severe dementia, unspecified type or whether behavioral or mood disorder Fast 7 Score 7c (due more to fall risk than dementia). Continues on Celexa 20 mg daily.  3.   Frequent falls Agree with use of specialty Helmet to protect head Use of standby assist/gait belt if attempting to transfer.     Follow up Palliative Care Visit:  Palliative Care continuing to follow up by monitoring for changes in appetite, weight, functional and cognitive status for chronic disease progression and management in agreement with patient's stated goals of care. Next visit in 3-4  weeks or prn.  This visit was coded based on medical  decision making (MDM).  Chief Complaint  Palliative Care continuing to follow pt for chronic medical management of seizures and falls in setting of dementia.  Had fall today and hit his left forehead.  HISTORY OF PRESENT ILLNESS: [name] is a 89 y.o. year old male with severe dementia who had a fall in April with ED visit and one today where he hit the left side of his forehead.  Coumadin was stopped on 06/28/22.  Pt denies any aura before a seizure happens.  He was observed leaning forward out of his wheelchair and aid attempted to break his fall.  He is wearing a special helmet to protect his head. Denies pain, SOB, nausea, dysuria or constipation. Unknown when the last date of seizure was.  Labs done at the facility included a Keppra level done on 11/09/22 which was normal at 25.5. 25 Hydroxy Vitamin D was insufficient at 22.9. He is on dysphagia diet (advanced) with nectar thick liquids.   ACTIVITIES OF DAILY LIVING: CONTINENT OF BLADDER/ BOWEL? No  MOBILITY:   WHEELCHAIR  APPETITE? Good Weight: 164 lbs at facility, weight at ED 05/22/22 was 183 lbs 8 oz  CURRENT PROBLEM LIST:  Patient Active Problem List   Diagnosis Date Noted   Severe dementia 07/27/2022   Compression fracture of lumbar vertebra, sequela 07/27/2022   Pancreatic lesion 07/27/2022   Frequent falls 05/01/2022   Weakness generalized 05/01/2022   Acute renal failure 05/01/2022   Supratherapeutic INR 05/01/2022   Abdominal swelling, right lower quadrant 05/01/2022  Orthostatic hypotension 04/23/2022   Permanent atrial fibrillation 09/07/2020   Chronic atrial fibrillation 03/30/2016   SOB (shortness of breath) 12/23/2012   Essential hypertension, benign    H/O amiodarone therapy    Secondary cardiomyopathy, unspecified    Encounter for long-term (current) use of anticoagulants 10/02/2010   PAST MEDICAL HISTORY:  Active Ambulatory Problems    Diagnosis Date Noted   Encounter for long-term (current) use of  anticoagulants 10/02/2010   Secondary cardiomyopathy, unspecified    Essential hypertension, benign    H/O amiodarone therapy    SOB (shortness of breath) 12/23/2012   Chronic atrial fibrillation 03/30/2016   Permanent atrial fibrillation 09/07/2020   Orthostatic hypotension 04/23/2022   Frequent falls 05/01/2022   Weakness generalized 05/01/2022   Acute renal failure 05/01/2022   Supratherapeutic INR 05/01/2022   Abdominal swelling, right lower quadrant 05/01/2022   Severe dementia 07/27/2022   Compression fracture of lumbar vertebra, sequela 07/27/2022   Pancreatic lesion 07/27/2022   Resolved Ambulatory Problems    Diagnosis Date Noted   Unspecified atrial flutter 06/22/2009   LEFT VENTRICULAR FUNCTION, DECREASED 03/24/2009   DYSPNEA 11/30/2009   ABDOMINAL BLOATING 06/22/2009   Atrial flutter, paroxysmal    Ejection fraction    IBS (irritable bowel syndrome)    Warfarin anticoagulation    Encounter for therapeutic drug monitoring 07/21/2013   Past Medical History:  Diagnosis Date   BPH (benign prostatic hypertrophy)    Cardiomyopathy (HCC)    Urination, excessive at night    SOCIAL HX:  Social History   Tobacco Use   Smoking status: Never    Passive exposure: Never   Smokeless tobacco: Never   Tobacco comments:    tobacco use- no  Substance Use Topics   Alcohol use: No    Alcohol/week: 0.0 standard drinks of alcohol   FAMILY HX:  Family History  Problem Relation Age of Onset   Diabetes Mother    Dementia Mother    Dementia Father    Diabetes Paternal Grandmother        Preferred Pharmacy: ALLERGIES:  Allergies  Allergen Reactions   Penicillins Rash    Has patient had a PCN reaction causing immediate rash, facial/tongue/throat swelling, SOB or lightheadedness with hypotension: Yes Has patient had a PCN reaction causing severe rash involving mucus membranes or skin necrosis: No Has patient had a PCN reaction that required hospitalization: No Has  patient had a PCN reaction occurring within the last 10 years: No If all of the above answers are "NO", then may proceed with Cephalosporin use.    Sulfonamide Derivatives Rash     PERTINENT MEDICATIONS:  Outpatient Encounter Medications as of 11/28/2022  Medication Sig   alfuzosin (UROXATRAL) 10 MG 24 hr tablet Take 10 mg by mouth at bedtime.   atorvastatin (LIPITOR) 10 MG tablet Take 10 mg by mouth daily.    citalopram (CELEXA) 20 MG tablet Take 1 tablet by mouth daily.   famotidine (PEPCID) 20 MG tablet Take 1 tablet by mouth daily.   Flaxseed, Linseed, (FLAXSEED OIL) 1000 MG CAPS Take 1 capsule by mouth daily.   latanoprost (XALATAN) 0.005 % ophthalmic solution Place 1 drop into both eyes at bedtime.   metoprolol succinate (TOPROL-XL) 25 MG 24 hr tablet TAKE 1/2 TABLET BY MOUTH ONCE DAILY; SCHEDULE AN APPOINTMENT FOR FURTHER REFILLS; FINAL ATTEMPT.   Omega-3 Fatty Acids (FISH OIL) 1000 MG CAPS Take 1,000 mg by mouth. OCCASIONALLY   [DISCONTINUED] warfarin (COUMADIN) 5 MG tablet TAKE ONE TABLET BY  MOUTH DAILY EXCEPT TAKE 1 AND 1/2 TABLETS ON TUESDAY AND SATURDAY.   No facility-administered encounter medications on file as of 11/28/2022.  Coumadin was stopped per note from ER on 06/28/22.  History obtained from review of EMR, discussion with primary team including Alfonse Ras NP, and interview with facility staff/caregiver and/or patient.    I reviewed available labs, medications, imaging, studies and related documents from the EMR.  Records reviewed and summarized above.   Physical Exam: GENERAL: NAD HEENT: left frontal forehead with mild 3 inch across red abrasion and circular area in midforehead with erythematous abrasion, wearing protective helmet. No deformity noted LUNGS: CTAB, no increased work of breathing, room air CARDIAC:  S1S2, RRR with no MRG, No edema/cyanosis ABD:  Normo-active BS x 4 quads, soft, non-tender EXTREMITIES: Normal ROM, no deformity, strength equal in BLE,  right arm slightly weaker NEURO:  PERRLa, no facial deformity PSYCH:  non-anxious affect, A & O x 1  Thank you for the opportunity to participate in the care of Bronwen Betters. Please call our main office at [phone] if we can be of additional assistance.    Joycelyn Man FNP-C  [email or web address] .[facility]  Phone:  [phone]

## 2022-11-29 DIAGNOSIS — M6281 Muscle weakness (generalized): Secondary | ICD-10-CM | POA: Diagnosis not present

## 2022-11-29 DIAGNOSIS — R278 Other lack of coordination: Secondary | ICD-10-CM | POA: Diagnosis not present

## 2022-11-29 DIAGNOSIS — M25522 Pain in left elbow: Secondary | ICD-10-CM | POA: Diagnosis not present

## 2022-11-30 DIAGNOSIS — R278 Other lack of coordination: Secondary | ICD-10-CM | POA: Diagnosis not present

## 2022-11-30 DIAGNOSIS — M6281 Muscle weakness (generalized): Secondary | ICD-10-CM | POA: Diagnosis not present

## 2022-12-03 ENCOUNTER — Ambulatory Visit: Payer: Medicare Other | Admitting: Neurology

## 2022-12-03 DIAGNOSIS — M6281 Muscle weakness (generalized): Secondary | ICD-10-CM | POA: Diagnosis not present

## 2022-12-03 DIAGNOSIS — R278 Other lack of coordination: Secondary | ICD-10-CM | POA: Diagnosis not present

## 2022-12-04 DIAGNOSIS — R278 Other lack of coordination: Secondary | ICD-10-CM | POA: Diagnosis not present

## 2022-12-04 DIAGNOSIS — M7020 Olecranon bursitis, unspecified elbow: Secondary | ICD-10-CM | POA: Diagnosis not present

## 2022-12-04 DIAGNOSIS — M6281 Muscle weakness (generalized): Secondary | ICD-10-CM | POA: Diagnosis not present

## 2022-12-04 DIAGNOSIS — R296 Repeated falls: Secondary | ICD-10-CM | POA: Diagnosis not present

## 2022-12-05 ENCOUNTER — Encounter: Payer: Self-pay | Admitting: Orthopedic Surgery

## 2022-12-05 ENCOUNTER — Other Ambulatory Visit (INDEPENDENT_AMBULATORY_CARE_PROVIDER_SITE_OTHER): Payer: Medicare Other

## 2022-12-05 ENCOUNTER — Ambulatory Visit (INDEPENDENT_AMBULATORY_CARE_PROVIDER_SITE_OTHER): Payer: Medicare Other | Admitting: Orthopedic Surgery

## 2022-12-05 VITALS — BP 96/61 | HR 73 | Ht 71.0 in | Wt 165.0 lb

## 2022-12-05 DIAGNOSIS — M7022 Olecranon bursitis, left elbow: Secondary | ICD-10-CM

## 2022-12-05 DIAGNOSIS — M6281 Muscle weakness (generalized): Secondary | ICD-10-CM | POA: Diagnosis not present

## 2022-12-05 DIAGNOSIS — M25522 Pain in left elbow: Secondary | ICD-10-CM | POA: Diagnosis not present

## 2022-12-05 DIAGNOSIS — R278 Other lack of coordination: Secondary | ICD-10-CM | POA: Diagnosis not present

## 2022-12-05 NOTE — Progress Notes (Signed)
New Patient Visit  Assessment: [name] is a 89 y.o. male with the following: 1. Olecranon bursitis of left elbow  Plan: [name] has swelling in the left elbow, consistent with left olecranon bursitis.  This is not tender.  No pain.  He has full range of motion.  Radiographs demonstrate a chronic appearing fracture through an osteophyte off the olecranon.  Nothing is needed at this time.  He can follow-up as needed.  Follow-up: Return if symptoms worsen or fail to improve.  Subjective:  Chief Complaint  Patient presents with   Elbow Problem    L elbow nodule     History of Present Illness: [name] is a 89 y.o. male who presents for evaluation of left elbow pain.  He presents from a nursing facility.  No staff is available in clinic today.  Patient states that he has fallen several times.  He has no pain by his left elbow.  The swelling in his left elbow does not bother him.  He is able to touch his hand to his hand.   Review of Systems: No fevers or chills No numbness or tingling No chest pain No shortness of breath No bowel or bladder dysfunction No GI distress No headaches   Medical History:  Past Medical History:  Diagnosis Date   Atrial flutter, paroxysmal (HCC)    a. Hx of TEE with LAA clots. b. s/p ESP/RFCA of atrial flutter 08/2008. b. Recurrence of A-flutter after ablation/atypical, requiring DCCV and amiodaorone.   BPH (benign prostatic hypertrophy)    Cardiomyopathy (HCC)    a. Prior EF 30-35%, felt tachycardia induced cardiomyopathyy. b. Improved after cardioversion and restoration of normal sinus rhythm - EF 65-70% by echo 10/13   Essential hypertension, benign    H/O amiodarone therapy    Used for atrial flutter after return of flutter after ablation   Shortness of breath for last year   when pt lies down    Urination, excessive at night    Warfarin anticoagulation    For atrial flutter    Past Surgical History:  Procedure  Laterality Date   CYSTOSCOPY/RETROGRADE/URETEROSCOPY Right 08/26/2017   Procedure: CYSTOSCOPY/ RIGHT RETROGRADE PYELOGRAM/ RIGHT URETEROSCOPY WITH RIGHT URETERAL STENT PLACEMENT;  Surgeon: Malen Gauze, MD;  Location: [facility];  Service: Urology;  Laterality: Right;   FEMORAL HERNIA REPAIR     x2   TOTAL KNEE ARTHROPLASTY Right 2004   TRANSURETHRAL PROSTATECTOMY WITH GYRUS INSTRUMENTS N/A 03/12/2013   Procedure: TRANSURETHRAL PROSTATECTOMY WITH GYRUS INSTRUMENTS;  Surgeon: Kathi Ludwig, MD;  Location: [facility];  Service: Urology;  Laterality: N/A;   TURP VAPORIZATION     UMBILICAL HERNIA REPAIR      Family History  Problem Relation Age of Onset   Diabetes Mother    Dementia Mother    Dementia Father    Diabetes Paternal Grandmother    Social History   Tobacco Use   Smoking status: Never    Passive exposure: Never   Smokeless tobacco: Never   Tobacco comments:    tobacco use- no  Vaping Use   Vaping Use: Never used  Substance Use Topics   Alcohol use: No    Alcohol/week: 0.0 standard drinks of alcohol   Drug use: No    Allergies  Allergen Reactions   Penicillins Rash    Has patient had a PCN reaction causing immediate rash, facial/tongue/throat swelling, SOB or lightheadedness with hypotension: Yes Has patient had a PCN reaction causing  severe rash involving mucus membranes or skin necrosis: No Has patient had a PCN reaction that required hospitalization: No Has patient had a PCN reaction occurring within the last 10 years: No If all of the above answers are "NO", then may proceed with Cephalosporin use.    Sulfonamide Derivatives Rash    Current Meds  Medication Sig   alfuzosin (UROXATRAL) 10 MG 24 hr tablet Take 10 mg by mouth at bedtime.   atorvastatin (LIPITOR) 10 MG tablet Take 10 mg by mouth daily.    citalopram (CELEXA) 20 MG tablet Take 1 tablet by mouth daily.   famotidine (PEPCID) 20 MG tablet Take 1 tablet by mouth daily.   Flaxseed, Linseed,  (FLAXSEED OIL) 1000 MG CAPS Take 1 capsule by mouth daily.   latanoprost (XALATAN) 0.005 % ophthalmic solution Place 1 drop into both eyes at bedtime.   levETIRAcetam (KEPPRA) 500 MG tablet Take 500 mg by mouth 2 (two) times daily.   metoprolol succinate (TOPROL-XL) 25 MG 24 hr tablet TAKE 1/2 TABLET BY MOUTH ONCE DAILY; SCHEDULE AN APPOINTMENT FOR FURTHER REFILLS; FINAL ATTEMPT.   Omega-3 Fatty Acids (FISH OIL) 1000 MG CAPS Take 1,000 mg by mouth. OCCASIONALLY    Objective: BP 96/61   Pulse 73   Ht 5\' 11"  (1.803 m)   Wt 165 lb (74.8 kg)   BMI 23.01 kg/m   Physical Exam:  General: Elderly male. and Seated in a wheelchair. Gait: Unable to ambulate.  Patient left arm demonstrates some swelling over the olecranon.  This is soft.  It is not tender.  No redness.  No drainage.  He has no tenderness to palpation over the tip of the olecranon.  Fingers warm and well-perfused.  He is able to flex his elbow beyond 110 degrees.  He has full extension.  Full pronation and supination.  IMAGING: I personally ordered and reviewed the following images   Rays of the left elbow were obtained in clinic today.  No acute injuries are noted.  There is a well-corticated osseous fragment off the tip of the olecranon.  This does not appear acute.  Elbow is reduced.  Minimal degenerative changes.  No bony lesions.  Impression: Left elbow x-rays without acute injury; well-corticated osseous fragment at the tip of the olecranon   New Medications:  No orders of the defined types were placed in this encounter.     Oliver Barre, MD  12/05/2022 9:40 AM

## 2022-12-06 DIAGNOSIS — R278 Other lack of coordination: Secondary | ICD-10-CM | POA: Diagnosis not present

## 2022-12-06 DIAGNOSIS — M6281 Muscle weakness (generalized): Secondary | ICD-10-CM | POA: Diagnosis not present

## 2022-12-07 DIAGNOSIS — R278 Other lack of coordination: Secondary | ICD-10-CM | POA: Diagnosis not present

## 2022-12-07 DIAGNOSIS — M6281 Muscle weakness (generalized): Secondary | ICD-10-CM | POA: Diagnosis not present

## 2022-12-07 DIAGNOSIS — M179 Osteoarthritis of knee, unspecified: Secondary | ICD-10-CM | POA: Diagnosis not present

## 2022-12-10 DIAGNOSIS — M6281 Muscle weakness (generalized): Secondary | ICD-10-CM | POA: Diagnosis not present

## 2022-12-10 DIAGNOSIS — R278 Other lack of coordination: Secondary | ICD-10-CM | POA: Diagnosis not present

## 2022-12-10 DIAGNOSIS — R5381 Other malaise: Secondary | ICD-10-CM | POA: Diagnosis not present

## 2022-12-10 DIAGNOSIS — R296 Repeated falls: Secondary | ICD-10-CM | POA: Diagnosis not present

## 2022-12-10 DIAGNOSIS — M7020 Olecranon bursitis, unspecified elbow: Secondary | ICD-10-CM | POA: Diagnosis not present

## 2022-12-11 DIAGNOSIS — M6281 Muscle weakness (generalized): Secondary | ICD-10-CM | POA: Diagnosis not present

## 2022-12-11 DIAGNOSIS — R278 Other lack of coordination: Secondary | ICD-10-CM | POA: Diagnosis not present

## 2022-12-12 DIAGNOSIS — R278 Other lack of coordination: Secondary | ICD-10-CM | POA: Diagnosis not present

## 2022-12-12 DIAGNOSIS — M6281 Muscle weakness (generalized): Secondary | ICD-10-CM | POA: Diagnosis not present

## 2022-12-13 DIAGNOSIS — M6281 Muscle weakness (generalized): Secondary | ICD-10-CM | POA: Diagnosis not present

## 2022-12-13 DIAGNOSIS — R278 Other lack of coordination: Secondary | ICD-10-CM | POA: Diagnosis not present

## 2022-12-14 DIAGNOSIS — R278 Other lack of coordination: Secondary | ICD-10-CM | POA: Diagnosis not present

## 2022-12-14 DIAGNOSIS — M6281 Muscle weakness (generalized): Secondary | ICD-10-CM | POA: Diagnosis not present

## 2022-12-17 DIAGNOSIS — R278 Other lack of coordination: Secondary | ICD-10-CM | POA: Diagnosis not present

## 2022-12-17 DIAGNOSIS — M6281 Muscle weakness (generalized): Secondary | ICD-10-CM | POA: Diagnosis not present

## 2022-12-19 ENCOUNTER — Telehealth: Payer: Self-pay | Admitting: *Deleted

## 2022-12-19 DIAGNOSIS — R278 Other lack of coordination: Secondary | ICD-10-CM | POA: Diagnosis not present

## 2022-12-19 DIAGNOSIS — M6281 Muscle weakness (generalized): Secondary | ICD-10-CM | POA: Diagnosis not present

## 2022-12-19 NOTE — Progress Notes (Signed)
  Care Coordination   Note   12/19/2022 Name: SELDEN NOTEBOOM MRN: [id number] DOB: January 23, 1934  LOGUN COLAVITO is a 89 y.o. year old male who sees Vyas, Dhruv B, MD for primary care. I reached out to [name] by phone today to offer care coordination services.  Patient is residing at [facility] per [facility].  [facility]  Care Coordination Care Guide  Direct Dial: [phone]

## 2022-12-20 DIAGNOSIS — M6281 Muscle weakness (generalized): Secondary | ICD-10-CM | POA: Diagnosis not present

## 2022-12-20 DIAGNOSIS — R278 Other lack of coordination: Secondary | ICD-10-CM | POA: Diagnosis not present

## 2022-12-21 DIAGNOSIS — R278 Other lack of coordination: Secondary | ICD-10-CM | POA: Diagnosis not present

## 2022-12-21 DIAGNOSIS — M6281 Muscle weakness (generalized): Secondary | ICD-10-CM | POA: Diagnosis not present

## 2022-12-21 DIAGNOSIS — Z515 Encounter for palliative care: Secondary | ICD-10-CM | POA: Diagnosis not present

## 2022-12-24 DIAGNOSIS — M6281 Muscle weakness (generalized): Secondary | ICD-10-CM | POA: Diagnosis not present

## 2022-12-24 DIAGNOSIS — I2510 Atherosclerotic heart disease of native coronary artery without angina pectoris: Secondary | ICD-10-CM | POA: Diagnosis not present

## 2022-12-24 DIAGNOSIS — R569 Unspecified convulsions: Secondary | ICD-10-CM | POA: Diagnosis not present

## 2022-12-24 DIAGNOSIS — R279 Unspecified lack of coordination: Secondary | ICD-10-CM | POA: Diagnosis not present

## 2022-12-24 DIAGNOSIS — I4891 Unspecified atrial fibrillation: Secondary | ICD-10-CM | POA: Diagnosis not present

## 2022-12-24 DIAGNOSIS — R5381 Other malaise: Secondary | ICD-10-CM | POA: Diagnosis not present

## 2022-12-24 DIAGNOSIS — I739 Peripheral vascular disease, unspecified: Secondary | ICD-10-CM | POA: Diagnosis not present

## 2022-12-25 DIAGNOSIS — R569 Unspecified convulsions: Secondary | ICD-10-CM | POA: Diagnosis not present

## 2022-12-25 DIAGNOSIS — I4891 Unspecified atrial fibrillation: Secondary | ICD-10-CM | POA: Diagnosis not present

## 2022-12-25 DIAGNOSIS — I10 Essential (primary) hypertension: Secondary | ICD-10-CM | POA: Diagnosis not present

## 2023-01-03 DIAGNOSIS — Z515 Encounter for palliative care: Secondary | ICD-10-CM | POA: Diagnosis not present

## 2023-01-06 DIAGNOSIS — M179 Osteoarthritis of knee, unspecified: Secondary | ICD-10-CM | POA: Diagnosis not present

## 2023-01-07 DIAGNOSIS — R296 Repeated falls: Secondary | ICD-10-CM | POA: Diagnosis not present

## 2023-01-07 DIAGNOSIS — M6281 Muscle weakness (generalized): Secondary | ICD-10-CM | POA: Diagnosis not present

## 2023-01-07 DIAGNOSIS — R5381 Other malaise: Secondary | ICD-10-CM | POA: Diagnosis not present

## 2023-01-07 DIAGNOSIS — R279 Unspecified lack of coordination: Secondary | ICD-10-CM | POA: Diagnosis not present

## 2023-01-16 DIAGNOSIS — N390 Urinary tract infection, site not specified: Secondary | ICD-10-CM | POA: Diagnosis not present

## 2023-01-18 ENCOUNTER — Telehealth: Payer: Self-pay | Admitting: *Deleted

## 2023-01-18 NOTE — Progress Notes (Signed)
  Care Coordination   Note   01/18/2023 Name: KAMAU EBBEN MRN: [id number] DOB: May 23, 1934  LUVERN FINKBINER is a 89 y.o. year old male who sees Vyas, Dhruv B, MD for primary care. I reached out to [name] by phone today to offer care coordination services.  Mr. Duryee was given information about Care Coordination services today including:   The Care Coordination services include support from the care team which includes your Nurse Coordinator, Clinical Social Worker, or Pharmacist.  The Care Coordination team is here to help remove barriers to the health concerns and goals most important to you. Care Coordination services are voluntary, and the patient may decline or stop services at any time by request to their care team member.   Care Coordination Consent Status: Patient did not agree to participate in care coordination services at this time.  Follow up plan:  per Fae Pippin (DPR) pt residing at nursing home in [place] does not expect pt to come back home.   Burman Nieves, CCMA Care Coordination Care Guide Direct Dial: [phone]

## 2023-01-23 DIAGNOSIS — R1312 Dysphagia, oropharyngeal phase: Secondary | ICD-10-CM | POA: Diagnosis not present

## 2023-01-24 ENCOUNTER — Telehealth: Payer: Self-pay | Admitting: *Deleted

## 2023-01-24 DIAGNOSIS — M6281 Muscle weakness (generalized): Secondary | ICD-10-CM | POA: Diagnosis not present

## 2023-01-24 DIAGNOSIS — R1312 Dysphagia, oropharyngeal phase: Secondary | ICD-10-CM | POA: Diagnosis not present

## 2023-01-24 NOTE — Progress Notes (Signed)
  Care Coordination   Note   01/24/2023 Name: GREYSON SOBOTTA MRN: [id number] DOB: 09/14/1933  CHRISTAPHER WELSER is a 89 y.o. year old male who sees Vyas, Dhruv B, MD for primary care. I reached out to [name] by phone today to offer care coordination services.  Mr. Quraishi was given information about Care Coordination services today including:   The Care Coordination services include support from the care team which includes your Nurse Coordinator, Clinical Social Worker, or Pharmacist.  The Care Coordination team is here to help remove barriers to the health concerns and goals most important to you. Care Coordination services are voluntary, and the patient may decline or stop services at any time by request to their care team member.   Care Coordination Consent Status: Patient did not agree to participate in care coordination services at this time.    Encounter Outcome:  Pt. Refused patient in [facility].Contact information updated.   [facility]  Care Coordination Care Guide  Direct Dial: [phone]

## 2023-01-25 DIAGNOSIS — M6281 Muscle weakness (generalized): Secondary | ICD-10-CM | POA: Diagnosis not present

## 2023-01-25 DIAGNOSIS — R1312 Dysphagia, oropharyngeal phase: Secondary | ICD-10-CM | POA: Diagnosis not present

## 2023-01-28 DIAGNOSIS — M6281 Muscle weakness (generalized): Secondary | ICD-10-CM | POA: Diagnosis not present

## 2023-01-28 DIAGNOSIS — R1312 Dysphagia, oropharyngeal phase: Secondary | ICD-10-CM | POA: Diagnosis not present

## 2023-01-29 DIAGNOSIS — M6281 Muscle weakness (generalized): Secondary | ICD-10-CM | POA: Diagnosis not present

## 2023-01-29 DIAGNOSIS — R1312 Dysphagia, oropharyngeal phase: Secondary | ICD-10-CM | POA: Diagnosis not present

## 2023-01-30 DIAGNOSIS — M6281 Muscle weakness (generalized): Secondary | ICD-10-CM | POA: Diagnosis not present

## 2023-01-30 DIAGNOSIS — R1312 Dysphagia, oropharyngeal phase: Secondary | ICD-10-CM | POA: Diagnosis not present

## 2023-01-31 DIAGNOSIS — M6281 Muscle weakness (generalized): Secondary | ICD-10-CM | POA: Diagnosis not present

## 2023-01-31 DIAGNOSIS — R1312 Dysphagia, oropharyngeal phase: Secondary | ICD-10-CM | POA: Diagnosis not present

## 2023-02-01 DIAGNOSIS — R1312 Dysphagia, oropharyngeal phase: Secondary | ICD-10-CM | POA: Diagnosis not present

## 2023-02-01 DIAGNOSIS — M6281 Muscle weakness (generalized): Secondary | ICD-10-CM | POA: Diagnosis not present

## 2023-02-04 DIAGNOSIS — R1312 Dysphagia, oropharyngeal phase: Secondary | ICD-10-CM | POA: Diagnosis not present

## 2023-02-04 DIAGNOSIS — M6281 Muscle weakness (generalized): Secondary | ICD-10-CM | POA: Diagnosis not present

## 2023-02-05 DIAGNOSIS — M6281 Muscle weakness (generalized): Secondary | ICD-10-CM | POA: Diagnosis not present

## 2023-02-05 DIAGNOSIS — R1312 Dysphagia, oropharyngeal phase: Secondary | ICD-10-CM | POA: Diagnosis not present

## 2023-02-06 DIAGNOSIS — M179 Osteoarthritis of knee, unspecified: Secondary | ICD-10-CM | POA: Diagnosis not present

## 2023-02-14 ENCOUNTER — Ambulatory Visit: Payer: Medicare Other | Admitting: Neurology

## 2023-02-18 ENCOUNTER — Ambulatory Visit: Payer: Medicare Other | Admitting: Neurology

## 2023-02-19 DIAGNOSIS — M6281 Muscle weakness (generalized): Secondary | ICD-10-CM | POA: Diagnosis not present

## 2023-02-19 DIAGNOSIS — M79651 Pain in right thigh: Secondary | ICD-10-CM | POA: Diagnosis not present

## 2023-02-19 DIAGNOSIS — R1312 Dysphagia, oropharyngeal phase: Secondary | ICD-10-CM | POA: Diagnosis not present

## 2023-02-19 DIAGNOSIS — M79661 Pain in right lower leg: Secondary | ICD-10-CM | POA: Diagnosis not present

## 2023-02-19 DIAGNOSIS — M25551 Pain in right hip: Secondary | ICD-10-CM | POA: Diagnosis not present

## 2023-02-20 DIAGNOSIS — R1312 Dysphagia, oropharyngeal phase: Secondary | ICD-10-CM | POA: Diagnosis not present

## 2023-02-20 DIAGNOSIS — M6281 Muscle weakness (generalized): Secondary | ICD-10-CM | POA: Diagnosis not present

## 2023-02-21 DIAGNOSIS — R1312 Dysphagia, oropharyngeal phase: Secondary | ICD-10-CM | POA: Diagnosis not present

## 2023-02-21 DIAGNOSIS — M6281 Muscle weakness (generalized): Secondary | ICD-10-CM | POA: Diagnosis not present

## 2023-02-22 DIAGNOSIS — R1312 Dysphagia, oropharyngeal phase: Secondary | ICD-10-CM | POA: Diagnosis not present

## 2023-02-22 DIAGNOSIS — M6281 Muscle weakness (generalized): Secondary | ICD-10-CM | POA: Diagnosis not present

## 2023-02-25 DIAGNOSIS — R1312 Dysphagia, oropharyngeal phase: Secondary | ICD-10-CM | POA: Diagnosis not present

## 2023-02-25 DIAGNOSIS — M6281 Muscle weakness (generalized): Secondary | ICD-10-CM | POA: Diagnosis not present

## 2023-02-26 DIAGNOSIS — R1312 Dysphagia, oropharyngeal phase: Secondary | ICD-10-CM | POA: Diagnosis not present

## 2023-02-26 DIAGNOSIS — M6281 Muscle weakness (generalized): Secondary | ICD-10-CM | POA: Diagnosis not present

## 2023-02-27 DIAGNOSIS — M6281 Muscle weakness (generalized): Secondary | ICD-10-CM | POA: Diagnosis not present

## 2023-02-27 DIAGNOSIS — Z515 Encounter for palliative care: Secondary | ICD-10-CM | POA: Diagnosis not present

## 2023-02-27 DIAGNOSIS — R1312 Dysphagia, oropharyngeal phase: Secondary | ICD-10-CM | POA: Diagnosis not present

## 2023-02-28 DIAGNOSIS — M6281 Muscle weakness (generalized): Secondary | ICD-10-CM | POA: Diagnosis not present

## 2023-02-28 DIAGNOSIS — R1312 Dysphagia, oropharyngeal phase: Secondary | ICD-10-CM | POA: Diagnosis not present

## 2023-03-01 DIAGNOSIS — R1312 Dysphagia, oropharyngeal phase: Secondary | ICD-10-CM | POA: Diagnosis not present

## 2023-03-01 DIAGNOSIS — M6281 Muscle weakness (generalized): Secondary | ICD-10-CM | POA: Diagnosis not present

## 2023-03-01 DIAGNOSIS — N390 Urinary tract infection, site not specified: Secondary | ICD-10-CM | POA: Diagnosis not present

## 2023-03-01 DIAGNOSIS — I10 Essential (primary) hypertension: Secondary | ICD-10-CM | POA: Diagnosis not present

## 2023-03-04 DIAGNOSIS — N390 Urinary tract infection, site not specified: Secondary | ICD-10-CM | POA: Diagnosis not present

## 2023-03-04 DIAGNOSIS — M6281 Muscle weakness (generalized): Secondary | ICD-10-CM | POA: Diagnosis not present

## 2023-03-04 DIAGNOSIS — R1312 Dysphagia, oropharyngeal phase: Secondary | ICD-10-CM | POA: Diagnosis not present

## 2023-03-05 DIAGNOSIS — R1312 Dysphagia, oropharyngeal phase: Secondary | ICD-10-CM | POA: Diagnosis not present

## 2023-03-05 DIAGNOSIS — M6281 Muscle weakness (generalized): Secondary | ICD-10-CM | POA: Diagnosis not present

## 2023-03-06 DIAGNOSIS — M6281 Muscle weakness (generalized): Secondary | ICD-10-CM | POA: Diagnosis not present

## 2023-03-06 DIAGNOSIS — R1312 Dysphagia, oropharyngeal phase: Secondary | ICD-10-CM | POA: Diagnosis not present

## 2023-03-07 DIAGNOSIS — M6281 Muscle weakness (generalized): Secondary | ICD-10-CM | POA: Diagnosis not present

## 2023-03-07 DIAGNOSIS — R1312 Dysphagia, oropharyngeal phase: Secondary | ICD-10-CM | POA: Diagnosis not present

## 2023-03-08 DIAGNOSIS — M6281 Muscle weakness (generalized): Secondary | ICD-10-CM | POA: Diagnosis not present

## 2023-03-08 DIAGNOSIS — R1312 Dysphagia, oropharyngeal phase: Secondary | ICD-10-CM | POA: Diagnosis not present

## 2023-03-09 DIAGNOSIS — M179 Osteoarthritis of knee, unspecified: Secondary | ICD-10-CM | POA: Diagnosis not present

## 2023-03-11 DIAGNOSIS — M6281 Muscle weakness (generalized): Secondary | ICD-10-CM | POA: Diagnosis not present

## 2023-03-11 DIAGNOSIS — R1312 Dysphagia, oropharyngeal phase: Secondary | ICD-10-CM | POA: Diagnosis not present

## 2023-03-12 DIAGNOSIS — M6281 Muscle weakness (generalized): Secondary | ICD-10-CM | POA: Diagnosis not present

## 2023-03-12 DIAGNOSIS — R1312 Dysphagia, oropharyngeal phase: Secondary | ICD-10-CM | POA: Diagnosis not present

## 2023-03-13 DIAGNOSIS — R1312 Dysphagia, oropharyngeal phase: Secondary | ICD-10-CM | POA: Diagnosis not present

## 2023-03-13 DIAGNOSIS — M6281 Muscle weakness (generalized): Secondary | ICD-10-CM | POA: Diagnosis not present

## 2023-03-14 DIAGNOSIS — M6281 Muscle weakness (generalized): Secondary | ICD-10-CM | POA: Diagnosis not present

## 2023-03-14 DIAGNOSIS — R1312 Dysphagia, oropharyngeal phase: Secondary | ICD-10-CM | POA: Diagnosis not present

## 2023-03-15 DIAGNOSIS — M6281 Muscle weakness (generalized): Secondary | ICD-10-CM | POA: Diagnosis not present

## 2023-03-15 DIAGNOSIS — R1312 Dysphagia, oropharyngeal phase: Secondary | ICD-10-CM | POA: Diagnosis not present

## 2023-03-16 DIAGNOSIS — R1312 Dysphagia, oropharyngeal phase: Secondary | ICD-10-CM | POA: Diagnosis not present

## 2023-03-16 DIAGNOSIS — M6281 Muscle weakness (generalized): Secondary | ICD-10-CM | POA: Diagnosis not present

## 2023-03-18 DIAGNOSIS — R1312 Dysphagia, oropharyngeal phase: Secondary | ICD-10-CM | POA: Diagnosis not present

## 2023-03-18 DIAGNOSIS — M6281 Muscle weakness (generalized): Secondary | ICD-10-CM | POA: Diagnosis not present

## 2023-03-19 DIAGNOSIS — M6281 Muscle weakness (generalized): Secondary | ICD-10-CM | POA: Diagnosis not present

## 2023-03-19 DIAGNOSIS — R1312 Dysphagia, oropharyngeal phase: Secondary | ICD-10-CM | POA: Diagnosis not present

## 2023-03-20 DIAGNOSIS — R569 Unspecified convulsions: Secondary | ICD-10-CM | POA: Diagnosis not present

## 2023-03-20 DIAGNOSIS — R279 Unspecified lack of coordination: Secondary | ICD-10-CM | POA: Diagnosis not present

## 2023-03-20 DIAGNOSIS — E785 Hyperlipidemia, unspecified: Secondary | ICD-10-CM | POA: Diagnosis not present

## 2023-03-20 DIAGNOSIS — M6281 Muscle weakness (generalized): Secondary | ICD-10-CM | POA: Diagnosis not present

## 2023-03-20 DIAGNOSIS — R296 Repeated falls: Secondary | ICD-10-CM | POA: Diagnosis not present

## 2023-03-20 DIAGNOSIS — I10 Essential (primary) hypertension: Secondary | ICD-10-CM | POA: Diagnosis not present

## 2023-03-20 DIAGNOSIS — R1312 Dysphagia, oropharyngeal phase: Secondary | ICD-10-CM | POA: Diagnosis not present

## 2023-03-21 DIAGNOSIS — R1312 Dysphagia, oropharyngeal phase: Secondary | ICD-10-CM | POA: Diagnosis not present

## 2023-03-21 DIAGNOSIS — M6281 Muscle weakness (generalized): Secondary | ICD-10-CM | POA: Diagnosis not present

## 2023-03-22 DIAGNOSIS — M6281 Muscle weakness (generalized): Secondary | ICD-10-CM | POA: Diagnosis not present

## 2023-03-22 DIAGNOSIS — R1312 Dysphagia, oropharyngeal phase: Secondary | ICD-10-CM | POA: Diagnosis not present

## 2023-03-25 DIAGNOSIS — R1312 Dysphagia, oropharyngeal phase: Secondary | ICD-10-CM | POA: Diagnosis not present

## 2023-03-25 DIAGNOSIS — M6281 Muscle weakness (generalized): Secondary | ICD-10-CM | POA: Diagnosis not present

## 2023-03-26 DIAGNOSIS — Z515 Encounter for palliative care: Secondary | ICD-10-CM | POA: Diagnosis not present

## 2023-03-26 DIAGNOSIS — R296 Repeated falls: Secondary | ICD-10-CM | POA: Diagnosis not present

## 2023-03-26 DIAGNOSIS — R1312 Dysphagia, oropharyngeal phase: Secondary | ICD-10-CM | POA: Diagnosis not present

## 2023-03-26 DIAGNOSIS — M6281 Muscle weakness (generalized): Secondary | ICD-10-CM | POA: Diagnosis not present

## 2023-03-27 DIAGNOSIS — R1312 Dysphagia, oropharyngeal phase: Secondary | ICD-10-CM | POA: Diagnosis not present

## 2023-03-27 DIAGNOSIS — M6281 Muscle weakness (generalized): Secondary | ICD-10-CM | POA: Diagnosis not present

## 2023-03-28 DIAGNOSIS — R1312 Dysphagia, oropharyngeal phase: Secondary | ICD-10-CM | POA: Diagnosis not present

## 2023-03-28 DIAGNOSIS — M6281 Muscle weakness (generalized): Secondary | ICD-10-CM | POA: Diagnosis not present

## 2023-03-29 DIAGNOSIS — W19XXXD Unspecified fall, subsequent encounter: Secondary | ICD-10-CM | POA: Diagnosis not present

## 2023-03-29 DIAGNOSIS — M25551 Pain in right hip: Secondary | ICD-10-CM | POA: Diagnosis not present

## 2023-03-29 DIAGNOSIS — R1312 Dysphagia, oropharyngeal phase: Secondary | ICD-10-CM | POA: Diagnosis not present

## 2023-03-29 DIAGNOSIS — R296 Repeated falls: Secondary | ICD-10-CM | POA: Diagnosis not present

## 2023-03-29 DIAGNOSIS — R5381 Other malaise: Secondary | ICD-10-CM | POA: Diagnosis not present

## 2023-03-29 DIAGNOSIS — M6281 Muscle weakness (generalized): Secondary | ICD-10-CM | POA: Diagnosis not present

## 2023-04-01 DIAGNOSIS — M6281 Muscle weakness (generalized): Secondary | ICD-10-CM | POA: Diagnosis not present

## 2023-04-01 DIAGNOSIS — R569 Unspecified convulsions: Secondary | ICD-10-CM | POA: Diagnosis not present

## 2023-04-01 DIAGNOSIS — R1312 Dysphagia, oropharyngeal phase: Secondary | ICD-10-CM | POA: Diagnosis not present

## 2023-04-02 DIAGNOSIS — R1312 Dysphagia, oropharyngeal phase: Secondary | ICD-10-CM | POA: Diagnosis not present

## 2023-04-02 DIAGNOSIS — M6281 Muscle weakness (generalized): Secondary | ICD-10-CM | POA: Diagnosis not present

## 2023-04-03 DIAGNOSIS — R1312 Dysphagia, oropharyngeal phase: Secondary | ICD-10-CM | POA: Diagnosis not present

## 2023-04-03 DIAGNOSIS — M6281 Muscle weakness (generalized): Secondary | ICD-10-CM | POA: Diagnosis not present

## 2023-04-04 DIAGNOSIS — M6281 Muscle weakness (generalized): Secondary | ICD-10-CM | POA: Diagnosis not present

## 2023-04-04 DIAGNOSIS — R1312 Dysphagia, oropharyngeal phase: Secondary | ICD-10-CM | POA: Diagnosis not present

## 2023-04-05 DIAGNOSIS — R1312 Dysphagia, oropharyngeal phase: Secondary | ICD-10-CM | POA: Diagnosis not present

## 2023-04-05 DIAGNOSIS — M6281 Muscle weakness (generalized): Secondary | ICD-10-CM | POA: Diagnosis not present

## 2023-04-09 DIAGNOSIS — R296 Repeated falls: Secondary | ICD-10-CM | POA: Diagnosis not present

## 2023-04-09 DIAGNOSIS — R5381 Other malaise: Secondary | ICD-10-CM | POA: Diagnosis not present

## 2023-04-12 DIAGNOSIS — N390 Urinary tract infection, site not specified: Secondary | ICD-10-CM | POA: Diagnosis not present

## 2023-04-18 DIAGNOSIS — N390 Urinary tract infection, site not specified: Secondary | ICD-10-CM | POA: Diagnosis not present

## 2023-04-18 DIAGNOSIS — R5381 Other malaise: Secondary | ICD-10-CM | POA: Diagnosis not present

## 2023-04-30 DIAGNOSIS — R5381 Other malaise: Secondary | ICD-10-CM | POA: Diagnosis not present

## 2023-04-30 DIAGNOSIS — I4891 Unspecified atrial fibrillation: Secondary | ICD-10-CM | POA: Diagnosis not present

## 2023-05-01 DIAGNOSIS — R296 Repeated falls: Secondary | ICD-10-CM | POA: Diagnosis not present

## 2023-05-01 DIAGNOSIS — W19XXXD Unspecified fall, subsequent encounter: Secondary | ICD-10-CM | POA: Diagnosis not present

## 2023-05-01 DIAGNOSIS — R569 Unspecified convulsions: Secondary | ICD-10-CM | POA: Diagnosis not present

## 2023-05-09 DIAGNOSIS — Z515 Encounter for palliative care: Secondary | ICD-10-CM | POA: Diagnosis not present

## 2023-05-09 DIAGNOSIS — R296 Repeated falls: Secondary | ICD-10-CM | POA: Diagnosis not present

## 2023-05-09 DIAGNOSIS — L89899 Pressure ulcer of other site, unspecified stage: Secondary | ICD-10-CM | POA: Diagnosis not present

## 2023-05-13 ENCOUNTER — Ambulatory Visit: Payer: Medicare Other | Admitting: Neurology

## 2023-05-13 ENCOUNTER — Telehealth: Payer: Self-pay | Admitting: Neurology

## 2023-05-13 NOTE — Telephone Encounter (Signed)
Pt's son-in-law, Anda Latina cancelled appointment due patient in a nursing home, too weak to come to appointment today.

## 2023-05-14 ENCOUNTER — Telehealth: Payer: Self-pay

## 2023-05-14 NOTE — Telephone Encounter (Signed)
Patient has had 6 scheduled new patient appointments - 1 that was a no show and 1 that was a late cancel within 24 hours. Dr Teresa Coombs would like pt discharged from practice.

## 2023-05-15 DIAGNOSIS — I5032 Chronic diastolic (congestive) heart failure: Secondary | ICD-10-CM | POA: Diagnosis not present

## 2023-05-15 DIAGNOSIS — M6281 Muscle weakness (generalized): Secondary | ICD-10-CM | POA: Diagnosis not present

## 2023-05-15 DIAGNOSIS — L89153 Pressure ulcer of sacral region, stage 3: Secondary | ICD-10-CM | POA: Diagnosis not present

## 2023-05-16 ENCOUNTER — Encounter: Payer: Self-pay | Admitting: Neurology

## 2023-05-22 DIAGNOSIS — M6281 Muscle weakness (generalized): Secondary | ICD-10-CM | POA: Diagnosis not present

## 2023-05-22 DIAGNOSIS — I5032 Chronic diastolic (congestive) heart failure: Secondary | ICD-10-CM | POA: Diagnosis not present

## 2023-05-22 DIAGNOSIS — L89153 Pressure ulcer of sacral region, stage 3: Secondary | ICD-10-CM | POA: Diagnosis not present

## 2023-05-26 DEATH — deceased
